# Patient Record
Sex: Female | Born: 1945
Health system: Southern US, Community
[De-identification: ages and names within clinical notes are randomized; demographics above are authoritative.]

## PROBLEM LIST (undated history)

## (undated) DIAGNOSIS — M12812 Other specific arthropathies, not elsewhere classified, left shoulder: Secondary | ICD-10-CM

## (undated) DIAGNOSIS — I639 Cerebral infarction, unspecified: Secondary | ICD-10-CM

## (undated) DIAGNOSIS — R9431 Abnormal electrocardiogram [ECG] [EKG]: Secondary | ICD-10-CM

## (undated) DIAGNOSIS — M75102 Unspecified rotator cuff tear or rupture of left shoulder, not specified as traumatic: Secondary | ICD-10-CM

## (undated) DIAGNOSIS — B029 Zoster without complications: Secondary | ICD-10-CM

## (undated) DIAGNOSIS — E669 Obesity, unspecified: Secondary | ICD-10-CM

## (undated) DIAGNOSIS — G709 Myoneural disorder, unspecified: Secondary | ICD-10-CM

## (undated) DIAGNOSIS — D649 Anemia, unspecified: Secondary | ICD-10-CM

## (undated) DIAGNOSIS — M12811 Other specific arthropathies, not elsewhere classified, right shoulder: Secondary | ICD-10-CM

## (undated) DIAGNOSIS — E538 Deficiency of other specified B group vitamins: Secondary | ICD-10-CM

## (undated) DIAGNOSIS — M797 Fibromyalgia: Secondary | ICD-10-CM

## (undated) DIAGNOSIS — Z87442 Personal history of urinary calculi: Secondary | ICD-10-CM

## (undated) DIAGNOSIS — F419 Anxiety disorder, unspecified: Secondary | ICD-10-CM

## (undated) DIAGNOSIS — M75101 Unspecified rotator cuff tear or rupture of right shoulder, not specified as traumatic: Secondary | ICD-10-CM

## (undated) DIAGNOSIS — E039 Hypothyroidism, unspecified: Secondary | ICD-10-CM

## (undated) DIAGNOSIS — E042 Nontoxic multinodular goiter: Secondary | ICD-10-CM

## (undated) DIAGNOSIS — K2270 Barrett's esophagus without dysplasia: Secondary | ICD-10-CM

## (undated) DIAGNOSIS — M1990 Unspecified osteoarthritis, unspecified site: Secondary | ICD-10-CM

## (undated) DIAGNOSIS — R52 Pain, unspecified: Secondary | ICD-10-CM

## (undated) DIAGNOSIS — M359 Systemic involvement of connective tissue, unspecified: Secondary | ICD-10-CM

## (undated) DIAGNOSIS — K219 Gastro-esophageal reflux disease without esophagitis: Secondary | ICD-10-CM

## (undated) DIAGNOSIS — R1310 Dysphagia, unspecified: Secondary | ICD-10-CM

## (undated) DIAGNOSIS — Z9889 Other specified postprocedural states: Secondary | ICD-10-CM

## (undated) DIAGNOSIS — R070 Pain in throat: Secondary | ICD-10-CM

## (undated) DIAGNOSIS — K222 Esophageal obstruction: Secondary | ICD-10-CM

## (undated) DIAGNOSIS — E785 Hyperlipidemia, unspecified: Secondary | ICD-10-CM

## (undated) DIAGNOSIS — F32A Depression, unspecified: Secondary | ICD-10-CM

## (undated) DIAGNOSIS — I10 Essential (primary) hypertension: Secondary | ICD-10-CM

## (undated) DIAGNOSIS — R0600 Dyspnea, unspecified: Secondary | ICD-10-CM

## (undated) DIAGNOSIS — N189 Chronic kidney disease, unspecified: Secondary | ICD-10-CM

## (undated) DIAGNOSIS — F329 Major depressive disorder, single episode, unspecified: Secondary | ICD-10-CM

## (undated) DIAGNOSIS — I499 Cardiac arrhythmia, unspecified: Secondary | ICD-10-CM

## (undated) DIAGNOSIS — K760 Fatty (change of) liver, not elsewhere classified: Secondary | ICD-10-CM

## (undated) DIAGNOSIS — E119 Type 2 diabetes mellitus without complications: Secondary | ICD-10-CM

## (undated) HISTORY — PX: ESOPHAGOGASTRODUODENOSCOPY: SHX1529

## (undated) HISTORY — PX: CHOLECYSTECTOMY: SHX55

## (undated) HISTORY — PX: BACK SURGERY: SHX140

## (undated) HISTORY — PX: ABDOMINAL HYSTERECTOMY: SHX81

## (undated) HISTORY — PX: COLON SURGERY: SHX602

## (undated) HISTORY — PX: OTHER SURGICAL HISTORY: SHX169

---
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
## 2004-07-07 ENCOUNTER — Ambulatory Visit: Payer: Self-pay | Admitting: Psychiatry

## 2005-11-30 ENCOUNTER — Ambulatory Visit: Payer: Self-pay | Admitting: Internal Medicine

## 2006-02-01 DIAGNOSIS — B029 Zoster without complications: Secondary | ICD-10-CM

## 2006-02-01 HISTORY — DX: Zoster without complications: B02.9

## 2006-12-12 ENCOUNTER — Ambulatory Visit: Payer: Self-pay | Admitting: Internal Medicine

## 2007-01-18 ENCOUNTER — Ambulatory Visit: Payer: Self-pay | Admitting: Internal Medicine

## 2008-02-06 ENCOUNTER — Ambulatory Visit: Payer: Self-pay | Admitting: Internal Medicine

## 2009-03-04 ENCOUNTER — Encounter: Payer: Self-pay | Admitting: Internal Medicine

## 2009-04-17 ENCOUNTER — Ambulatory Visit: Payer: Self-pay | Admitting: Internal Medicine

## 2009-06-25 ENCOUNTER — Ambulatory Visit: Payer: Self-pay | Admitting: Internal Medicine

## 2009-06-27 ENCOUNTER — Ambulatory Visit: Payer: Self-pay | Admitting: Internal Medicine

## 2010-05-28 ENCOUNTER — Ambulatory Visit: Payer: Self-pay | Admitting: Internal Medicine

## 2010-08-27 ENCOUNTER — Ambulatory Visit: Payer: Self-pay | Admitting: Emergency Medicine

## 2010-09-03 ENCOUNTER — Inpatient Hospital Stay: Payer: Self-pay | Admitting: Emergency Medicine

## 2010-09-04 LAB — PATHOLOGY REPORT

## 2011-07-02 ENCOUNTER — Ambulatory Visit: Payer: Self-pay | Admitting: Internal Medicine

## 2012-07-27 ENCOUNTER — Ambulatory Visit: Payer: Self-pay | Admitting: Internal Medicine

## 2013-03-01 ENCOUNTER — Ambulatory Visit: Payer: Self-pay | Admitting: Internal Medicine

## 2013-03-02 ENCOUNTER — Encounter: Payer: Self-pay | Admitting: Rheumatology

## 2013-03-04 ENCOUNTER — Encounter: Payer: Self-pay | Admitting: Rheumatology

## 2013-03-04 ENCOUNTER — Ambulatory Visit: Payer: Self-pay | Admitting: Internal Medicine

## 2013-04-19 DIAGNOSIS — M1990 Unspecified osteoarthritis, unspecified site: Secondary | ICD-10-CM | POA: Insufficient documentation

## 2013-05-18 DIAGNOSIS — N200 Calculus of kidney: Secondary | ICD-10-CM | POA: Insufficient documentation

## 2013-05-18 DIAGNOSIS — D510 Vitamin B12 deficiency anemia due to intrinsic factor deficiency: Secondary | ICD-10-CM | POA: Insufficient documentation

## 2013-05-30 ENCOUNTER — Ambulatory Visit: Payer: Self-pay | Admitting: Gastroenterology

## 2013-08-06 ENCOUNTER — Ambulatory Visit: Payer: Self-pay | Admitting: Internal Medicine

## 2013-08-07 DIAGNOSIS — E538 Deficiency of other specified B group vitamins: Secondary | ICD-10-CM | POA: Insufficient documentation

## 2013-10-18 ENCOUNTER — Ambulatory Visit: Payer: Self-pay | Admitting: Bariatrics

## 2013-10-19 ENCOUNTER — Ambulatory Visit: Payer: Self-pay | Admitting: Bariatrics

## 2014-01-01 ENCOUNTER — Ambulatory Visit: Payer: Self-pay | Admitting: Bariatrics

## 2014-02-20 ENCOUNTER — Ambulatory Visit: Payer: Self-pay | Admitting: Bariatrics

## 2014-02-20 DIAGNOSIS — I10 Essential (primary) hypertension: Secondary | ICD-10-CM

## 2014-02-20 LAB — BASIC METABOLIC PANEL
Anion Gap: 6 — ABNORMAL LOW (ref 7–16)
BUN: 15 mg/dL (ref 7–18)
Calcium, Total: 9.5 mg/dL (ref 8.5–10.1)
Chloride: 102 mmol/L (ref 98–107)
Co2: 28 mmol/L (ref 21–32)
Creatinine: 0.98 mg/dL (ref 0.60–1.30)
EGFR (African American): >60
EGFR (Non-African Amer.): 60 — ABNORMAL LOW
Glucose: 216 mg/dL — ABNORMAL HIGH (ref 65–99)
Osmolality: 279 (ref 275–301)
Potassium: 3.6 mmol/L (ref 3.5–5.1)
Sodium: 136 mmol/L (ref 136–145)

## 2014-02-20 LAB — CBC WITH DIFFERENTIAL/PLATELET
Basophil #: 0.1 10*3/uL (ref 0.0–0.1)
Basophil %: 0.9 %
Eosinophil #: 0.2 10*3/uL (ref 0.0–0.7)
Eosinophil %: 2.3 %
HCT: 39.5 % (ref 35.0–47.0)
HGB: 12.9 g/dL (ref 12.0–16.0)
Lymphocyte %: 28.1 %
Lymphs Abs: 2.1 10*3/uL (ref 1.0–3.6)
MCH: 28.9 pg (ref 26.0–34.0)
MCHC: 32.6 g/dL (ref 32.0–36.0)
MCV: 89 fL (ref 80–100)
Monocyte #: 0.6 x10 3/mm (ref 0.2–0.9)
Monocyte %: 8.6 %
Neutrophil #: 4.4 10*3/uL (ref 1.4–6.5)
Neutrophil %: 60.1 %
RBC: 4.45 10*6/uL (ref 3.80–5.20)
RDW: 15 % — ABNORMAL HIGH (ref 11.5–14.5)
WBC: 7.3 10*3/uL (ref 3.6–11.0)

## 2014-03-05 ENCOUNTER — Inpatient Hospital Stay: Payer: Self-pay | Admitting: Bariatrics

## 2014-03-06 LAB — BASIC METABOLIC PANEL
Anion Gap: 8 (ref 7–16)
BUN: 12 mg/dL (ref 7–18)
Calcium, Total: 8.7 mg/dL (ref 8.5–10.1)
Chloride: 106 mmol/L (ref 98–107)
Co2: 27 mmol/L (ref 21–32)
Creatinine: 1 mg/dL (ref 0.60–1.30)
EGFR (African American): >60
EGFR (Non-African Amer.): 59 — ABNORMAL LOW
Glucose: 185 mg/dL — ABNORMAL HIGH (ref 65–99)
Osmolality: 286 (ref 275–301)
Potassium: 3.8 mmol/L (ref 3.5–5.1)
Sodium: 141 mmol/L (ref 136–145)

## 2014-03-06 LAB — MAGNESIUM: Magnesium: 1.9 mg/dL

## 2014-03-06 LAB — CBC WITH DIFFERENTIAL/PLATELET
Basophil #: 0.1 10*3/uL (ref 0.0–0.1)
Basophil %: 0.4 %
Eosinophil #: 0 10*3/uL (ref 0.0–0.7)
Eosinophil %: 0.1 %
HCT: 31.8 % — ABNORMAL LOW (ref 35.0–47.0)
HGB: 10.4 g/dL — ABNORMAL LOW (ref 12.0–16.0)
Lymphocyte %: 9.2 %
Lymphs Abs: 1.2 10*3/uL (ref 1.0–3.6)
MCH: 28.8 pg (ref 26.0–34.0)
MCHC: 32.7 g/dL (ref 32.0–36.0)
MCV: 88 fL (ref 80–100)
Monocyte #: 0.9 x10 3/mm (ref 0.2–0.9)
Monocyte %: 6.8 %
Neutrophil #: 11.1 10*3/uL — ABNORMAL HIGH (ref 1.4–6.5)
Neutrophil %: 83.5 %
Platelet: 172 10*3/uL (ref 150–440)
RBC: 3.61 10*6/uL — ABNORMAL LOW (ref 3.80–5.20)
RDW: 14.4 % (ref 11.5–14.5)
WBC: 13.3 10*3/uL — ABNORMAL HIGH (ref 3.6–11.0)

## 2014-05-27 LAB — SURGICAL PATHOLOGY

## 2014-06-02 NOTE — Op Note (Signed)
PATIENT NAME:  Katelyn Simmons, Katelyn Simmons MR#:  [id number] DATE OF BIRTH:  1945-08-30  DATE OF PROCEDURE:  03/05/2014  PROCEDURES PERFORMED:  Laparoscopic lysis of adhesions followed by duodenal diverticulectomy.   PREOPERATIVE DIAGNOSIS: Duodenal diverticulum involving the third portion of the duodenum.  POSTOPERATIVE DIAGNOSIS: Duodenal diverticulum involving the third portion of the duodenum with pseudodiverticulum with absence of serosa and any significant muscular coverage of identified diverticulum, also extensive omental adhesions following prior open cholecystectomy.   PHYSICIANS IN ATTENDANCE: Arletta Bale, MD   ASSISTANT: Link Snuffer, PA   ESTIMATED BLOOD LOSS: Approximately 50 mL.   DESCRIPTION OF PROCEDURE: The patient was brought to the operating room and placed in the supine position. General anesthesia was obtained with orotracheal intubation. Foley catheter inserted sterilely. TED hose and Thromboguards were applied and a foot board applied at the end of the operative bed. The abdomen and lower chest sterilely prepped and draped. A 5 mm Optiview trocar introduced under direct visualization in the left upper quadrant of the abdomen. Pneumoperitoneum was obtained with carbon dioxide. The patient was noted to have diffuse omental adhesions in the central and right upper quadrant of the abdomen. A 5 mm Optiview trocar introduced under direct visualization and the left lower quadrant of the abdomen and dissection of adhesions was initiated by a combination of blunt dissection and use of the Harmonic scalpel. This was extended across the upper abdomen and eventually into the right upper quadrant of the abdomen. As this was done, additional 5 mm trocars were introduced in the left upper and right upper quadrant of the abdomen. Eventually 4 trocars were placed across the upper abdomen. Further omental adhesions freed from the undersurface of the right lobe of the liver where they were noted to be  particularly dense. This ultimately led to identification of the distal stomach and proximal duodenum. On initial evaluation, there were no discernible mass effects appreciated. The patient had omental adhesions along the greater curvature of the stomach. These were taken down by use of the Harmonic scalpel. After freeing adhesions associated with the stomach, the patient had release of peritoneal attachments of the first and then second portions of the duodenum, mobilizing the proximal transverse colon away from the duodenum. The second portion of the duodenum failed to reveal any evidence of overt mass effect. This dissection was then extended towards the midline to a point which the superior mesenteric vasculature was encountered. Again, there was no overt evidence of mass effect. It was decided at this point to expose the ligament of Treitz which was readily identified. Portions of the peritoneum associated with the mesentery of the colon of this area was scored and the fourth portion of the duodenum was visualized through the colon mesentery. No abnormalities were associated with this. At this point, the gastrocolic ligament was incised inferior to the gastroepiploic vasculature entering the lesser sac. Gastric attachments to the pancreas mobilized by use of the Harmonic scalpel. The transverse colon was then freed from the inferior margins of the pancreas. The fourth portion of the duodenum inspected and this was then followed again towards the superior mesenteric vasculature. Again, no mass effect identified. The proximal aspect of the duodenum then reinspected. The undersurface of the duodenum was freed from the retroperitoneal attachments and immediately adjacent to the superior mesenteric vasculature, a diverticulum became identifiable as soft tissue attachments were freed. This led to identification of a mucosal and submucosal diverticulum extending through what appeared to be an eventration of the  duodenal wall.  This was lying almost in a retropancreatic location. This was freed circumferentially down to an area where the fine margin was noted along the inferior margin of the diverticulum and eventually what was felt to be the superior margin of the diverticulum. We decided to transect this with a GIA stapler. A 12 mm trocar introduced in the left upper quadrant of the abdomen and a white load GIA stapler was used to divide the diverticulum as close as possible to the intact serosal aspects of the duodenum. This was then treated by approximation of the serosa with interrupted 2-0 Vicryl suture. The suture line then sprayed with Evicel glue followed by positioning of omentum in the retroduodenal position. This was secured to the margins of the duodenum with interrupted Vicryl suture. It was at this point that on inspection of the lateral margin of the superior mesenteric vasculature and fat pad, a small lymphatic leak was identified. The lymphatic channel appeared to be evident. This was sealed by Hemoclips. Next, JP drains were introduced through right lateral and left lateral trocar sites. These were positioned along the inferior margin of the duodenum and on the superior margin of the colonic mesentery. The omentum was reinspected for hemostasis and found to be adequate. At this time, no additional intervention was felt to be necessary. The trocars were removed. The wound was injected with 0.25% Marcaine and closed with staples. The drain secured with nylon sutures, covered with sterile dressings. The patient allowed to recover, having tolerated the procedure well.   ____________________________ Venia Carbon. Duke Salvia, MD mat:TT D: 03/06/2014 20:22:16 ET T: 03/06/2014 21:46:25 ET JOB#: [id number]  cc: Legrand Como A. Duke Salvia, MD, <Dictator> Ladora Daniel MD ELECTRONICALLY SIGNED 04/04/2014 17:57

## 2014-10-08 ENCOUNTER — Other Ambulatory Visit: Payer: Self-pay | Admitting: Internal Medicine

## 2014-10-08 ENCOUNTER — Ambulatory Visit
Admission: RE | Admit: 2014-10-08 | Discharge: 2014-10-08 | Disposition: A | Discharge status: Home / Self Care | Payer: Medicare HMO | Source: Ambulatory Visit | Attending: Internal Medicine | Admitting: Internal Medicine

## 2014-10-08 DIAGNOSIS — Z1231 Encounter for screening mammogram for malignant neoplasm of breast: Secondary | ICD-10-CM

## 2014-10-22 ENCOUNTER — Other Ambulatory Visit: Payer: Self-pay | Admitting: Internal Medicine

## 2014-10-22 DIAGNOSIS — M25511 Pain in right shoulder: Secondary | ICD-10-CM

## 2014-10-30 ENCOUNTER — Ambulatory Visit: Payer: Medicare HMO

## 2014-11-01 ENCOUNTER — Ambulatory Visit: Payer: Medicare HMO

## 2014-12-16 DIAGNOSIS — M75101 Unspecified rotator cuff tear or rupture of right shoulder, not specified as traumatic: Secondary | ICD-10-CM | POA: Insufficient documentation

## 2014-12-16 DIAGNOSIS — M12811 Other specific arthropathies, not elsewhere classified, right shoulder: Secondary | ICD-10-CM | POA: Insufficient documentation

## 2014-12-16 DIAGNOSIS — M12812 Other specific arthropathies, not elsewhere classified, left shoulder: Secondary | ICD-10-CM | POA: Insufficient documentation

## 2014-12-16 DIAGNOSIS — M75120 Complete rotator cuff tear or rupture of unspecified shoulder, not specified as traumatic: Secondary | ICD-10-CM | POA: Insufficient documentation

## 2015-02-20 DIAGNOSIS — I10 Essential (primary) hypertension: Secondary | ICD-10-CM | POA: Insufficient documentation

## 2015-02-20 DIAGNOSIS — K2970 Gastritis, unspecified, without bleeding: Secondary | ICD-10-CM | POA: Insufficient documentation

## 2015-05-22 DIAGNOSIS — N183 Chronic kidney disease, stage 3 unspecified: Secondary | ICD-10-CM | POA: Insufficient documentation

## 2015-05-22 DIAGNOSIS — R9431 Abnormal electrocardiogram [ECG] [EKG]: Secondary | ICD-10-CM | POA: Insufficient documentation

## 2015-05-22 DIAGNOSIS — J301 Allergic rhinitis due to pollen: Secondary | ICD-10-CM | POA: Insufficient documentation

## 2015-05-22 DIAGNOSIS — R590 Localized enlarged lymph nodes: Secondary | ICD-10-CM | POA: Insufficient documentation

## 2015-09-11 ENCOUNTER — Other Ambulatory Visit: Payer: Self-pay | Admitting: Internal Medicine

## 2015-09-11 DIAGNOSIS — Z1231 Encounter for screening mammogram for malignant neoplasm of breast: Secondary | ICD-10-CM

## 2015-10-09 ENCOUNTER — Ambulatory Visit: Payer: Medicare HMO

## 2015-10-16 ENCOUNTER — Other Ambulatory Visit: Payer: Self-pay | Admitting: Internal Medicine

## 2015-10-16 ENCOUNTER — Ambulatory Visit
Admission: RE | Admit: 2015-10-16 | Discharge: 2015-10-16 | Disposition: A | Discharge status: Home / Self Care | Payer: PPO | Source: Ambulatory Visit | Attending: Internal Medicine | Admitting: Internal Medicine

## 2015-10-16 DIAGNOSIS — Z1231 Encounter for screening mammogram for malignant neoplasm of breast: Secondary | ICD-10-CM | POA: Insufficient documentation

## 2015-11-12 ENCOUNTER — Other Ambulatory Visit: Payer: Self-pay | Admitting: Internal Medicine

## 2015-11-12 DIAGNOSIS — R1010 Upper abdominal pain, unspecified: Secondary | ICD-10-CM

## 2015-11-25 ENCOUNTER — Ambulatory Visit
Admission: RE | Admit: 2015-11-25 | Discharge: 2015-11-25 | Disposition: A | Discharge status: Home / Self Care | Payer: PPO | Source: Ambulatory Visit | Attending: Internal Medicine | Admitting: Internal Medicine

## 2015-11-25 DIAGNOSIS — I700 Atherosclerosis of aorta: Secondary | ICD-10-CM | POA: Diagnosis not present

## 2015-11-25 DIAGNOSIS — K5730 Diverticulosis of large intestine without perforation or abscess without bleeding: Secondary | ICD-10-CM | POA: Diagnosis not present

## 2015-11-25 DIAGNOSIS — K760 Fatty (change of) liver, not elsewhere classified: Secondary | ICD-10-CM | POA: Insufficient documentation

## 2015-11-25 DIAGNOSIS — I2510 Atherosclerotic heart disease of native coronary artery without angina pectoris: Secondary | ICD-10-CM | POA: Insufficient documentation

## 2015-11-25 DIAGNOSIS — R1010 Upper abdominal pain, unspecified: Secondary | ICD-10-CM | POA: Insufficient documentation

## 2015-11-25 HISTORY — DX: Systemic involvement of connective tissue, unspecified: M35.9

## 2015-11-25 MED ORDER — IOPAMIDOL (ISOVUE-300) INJECTION 61%
100.0000 mL | Freq: Once | INTRAVENOUS | Status: AC | PRN
  Administered 2015-11-25: 100 mL via INTRAVENOUS

## 2015-12-10 DIAGNOSIS — K760 Fatty (change of) liver, not elsewhere classified: Secondary | ICD-10-CM | POA: Insufficient documentation

## 2015-12-16 ENCOUNTER — Other Ambulatory Visit: Payer: Self-pay | Admitting: Nurse Practitioner

## 2015-12-16 DIAGNOSIS — R09A2 Foreign body sensation, throat: Secondary | ICD-10-CM

## 2015-12-16 DIAGNOSIS — R110 Nausea: Secondary | ICD-10-CM

## 2015-12-16 DIAGNOSIS — R0989 Other specified symptoms and signs involving the circulatory and respiratory systems: Secondary | ICD-10-CM

## 2016-01-07 ENCOUNTER — Ambulatory Visit
Admission: RE | Admit: 2016-01-07 | Discharge: 2016-01-07 | Disposition: A | Discharge status: Home / Self Care | Payer: PPO | Source: Ambulatory Visit | Attending: Nurse Practitioner | Admitting: Nurse Practitioner

## 2016-01-07 DIAGNOSIS — R1312 Dysphagia, oropharyngeal phase: Secondary | ICD-10-CM

## 2016-01-07 DIAGNOSIS — R0989 Other specified symptoms and signs involving the circulatory and respiratory systems: Secondary | ICD-10-CM | POA: Diagnosis not present

## 2016-01-07 DIAGNOSIS — R110 Nausea: Secondary | ICD-10-CM | POA: Diagnosis not present

## 2016-01-07 DIAGNOSIS — R09A2 Foreign body sensation, throat: Secondary | ICD-10-CM

## 2016-01-07 NOTE — Therapy (Signed)
[place], [place], [address] Phone: [phone]   Fax:     Modified Barium Swallow  Patient Details  Name: Katelyn Simmons MRN: [id number] Date of Birth: 05-27-1945 No Data Recorded  Encounter Date: 01/07/2016      End of Session - 01/07/16 1336    Visit Number 1   Number of Visits 1   Date for SLP Re-Evaluation 01/07/16      Past Medical History:  Diagnosis Date  . Collagen vascular disease ([place])    RA    No past surgical history on file.  There were no vitals filed for this visit.   Subjective: Patient behavior: (alertness, ability to follow instructions, etc.): Patient is alert, able to state her symptoms, and follow directions.  Chief complaint: sensation of something in her throat and it elicits a gag at times.   Objective:  Radiological Procedure: A videoflouroscopic evaluation of oral-preparatory, reflex initiation, and pharyngeal phases of the swallow was performed; as well as a screening of the upper esophageal phase.  I. POSTURE: Upright in MBS chair  II. VIEW: Lateral  III. COMPENSATORY STRATEGIES: N/A IV. BOLUSES ADMINISTERED:   Thin Liquid: 1 small sips, 5 rapid consecutive sips   Nectar-thick Liquid: 1 moderate sip    Puree: 3 teaspoon presentation   Mechanical Soft: 1/4 graham cracker in applesauce   Barium tablet  V. RESULTS OF EVALUATION: A. ORAL PREPARATORY PHASE: (The lips, tongue, and velum are observed for strength and coordination)       **Overall Severity Rating: Within normal limits  B. SWALLOW INITIATION/REFLEX: (The reflex is normal if "triggered" by the time the bolus reached the base of the tongue)  **Overall Severity Rating: Within normal limits  C. PHARYNGEAL PHASE: (Pharyngeal function is normal if the bolus shows rapid, smooth, and continuous transit through the pharynx and there is no pharyngeal residue after the swallow)  **Overall  Severity Rating: Minimal; decrease laryngeal vestibule closure at the height of the swallow  D. LARYNGEAL PENETRATION: (Material entering into the laryngeal inlet/vestibule but not aspirated) With thin liquid: deep penetration with cough X1, transient penetration X2, no penetration X2   E. ASPIRATION: None  F. ESOPHAGEAL PHASE: (Screening of the upper esophagus) In the cervical esophagus there is impingement of an osteophyte.  However all boluses flow through the cervical esophagus, including a barium tablet.  An esophageal sweep showed stasis of the barium tablet in the mid-esophagus.   ASSESSMENT: This 70 year old woman, with sensation of foreign body in her throat, is presenting with minimal oropharyngeal dyspahgia.  Oral control of the bolus including oral hold, rotary mastication, and anterior to posterior transfer are within normal limits. Timing of the pharyngeal swallow is within normal limits.  Aspects of the pharyngeal stage of swallowing including tongue base retraction, hyolaryngeal excursion, epiglottic inversion, and duration/amplitude of UES opening are within normal limits.  There is no pharyngeal residue.  With rapid, consecutive drinking of thin liquid, there is laryngeal penetration with immediate, protective cough.  There was no observed tracheal aspiration.  In the cervical esophagus there is impingement of an osteophyte.  However all boluses flow through the cervical esophagus, including a barium tablet.  An esophageal sweep showed stasis of the barium tablet in the mid-esophagus.  The patient was counseled that her swallowing is safe.  She is counseled to avoid gulping liquids.  The patient would benefit from further esophageal evaluation with a barium study.  PLAN/RECOMMENDATIONS:  A. Diet: Regular   B. Swallowing Precautions: No gulping   C. Recommended consultation to: GI, barium swallow study   D. Therapy recommendations: N/A   E. Results and recommendations were  discussed with the patient immediately following the study and the final report routed to the referring practitioner.   Dysphagia, oropharyngeal phase  Nausea - Plan: DG OP Swallowing Func-Medicare/Speech Path, DG OP Swallowing Func-Medicare/Speech Path  Sensation of foreign body in throat - Plan: DG OP Swallowing Func-Medicare/Speech Path, DG OP Swallowing Func-Medicare/Speech Path      G-Codes - 02/01/2016 1336    Functional Assessment Tool Used MBSS, clinical judgment   Functional Limitations Swallowing   Swallow Current Status [id number]) At least 1 percent but less than 20 percent impaired, limited or restricted   Swallow Goal Status [id number]) At least 1 percent but less than 20 percent impaired, limited or restricted   Swallow Discharge Status [phone]) At least 1 percent but less than 20 percent impaired, limited or restricted          Problem List There are no active problems to display for this patient.  Leroy Sea, MS/CCC- SLP  Lou Miner 02-01-16, 1:37 PM  [facility] [place], [place], [address] Phone: [phone]   Fax:     Name: ASHLA PULLIS MRN: [id number] Date of Birth: 1945/07/25

## 2016-03-18 DIAGNOSIS — R933 Abnormal findings on diagnostic imaging of other parts of digestive tract: Secondary | ICD-10-CM | POA: Insufficient documentation

## 2016-03-18 DIAGNOSIS — K219 Gastro-esophageal reflux disease without esophagitis: Secondary | ICD-10-CM | POA: Diagnosis not present

## 2016-03-18 DIAGNOSIS — R1310 Dysphagia, unspecified: Secondary | ICD-10-CM | POA: Diagnosis not present

## 2016-03-18 DIAGNOSIS — R109 Unspecified abdominal pain: Secondary | ICD-10-CM | POA: Diagnosis not present

## 2016-03-18 DIAGNOSIS — K449 Diaphragmatic hernia without obstruction or gangrene: Secondary | ICD-10-CM | POA: Diagnosis not present

## 2016-03-26 DIAGNOSIS — M75122 Complete rotator cuff tear or rupture of left shoulder, not specified as traumatic: Secondary | ICD-10-CM | POA: Diagnosis not present

## 2016-03-26 DIAGNOSIS — M75121 Complete rotator cuff tear or rupture of right shoulder, not specified as traumatic: Secondary | ICD-10-CM | POA: Diagnosis not present

## 2016-03-26 DIAGNOSIS — M12812 Other specific arthropathies, not elsewhere classified, left shoulder: Secondary | ICD-10-CM | POA: Diagnosis not present

## 2016-03-26 DIAGNOSIS — M12811 Other specific arthropathies, not elsewhere classified, right shoulder: Secondary | ICD-10-CM | POA: Diagnosis not present

## 2016-03-30 ENCOUNTER — Encounter: Payer: Self-pay | Admitting: *Deleted

## 2016-03-31 ENCOUNTER — Ambulatory Visit: Payer: Medicare HMO | Admitting: Anesthesiology

## 2016-03-31 ENCOUNTER — Encounter
Admission: RE | Disposition: A | Discharge status: Home / Self Care | Payer: Self-pay | Source: Ambulatory Visit | Attending: Unknown Physician Specialty

## 2016-03-31 ENCOUNTER — Ambulatory Visit
Admission: RE | Admit: 2016-03-31 | Discharge: 2016-03-31 | Disposition: A | Discharge status: Home / Self Care | Payer: Medicare HMO | Source: Ambulatory Visit | Attending: Unknown Physician Specialty | Admitting: Unknown Physician Specialty

## 2016-03-31 DIAGNOSIS — M069 Rheumatoid arthritis, unspecified: Secondary | ICD-10-CM | POA: Diagnosis not present

## 2016-03-31 DIAGNOSIS — K210 Gastro-esophageal reflux disease with esophagitis: Secondary | ICD-10-CM | POA: Insufficient documentation

## 2016-03-31 DIAGNOSIS — Z885 Allergy status to narcotic agent status: Secondary | ICD-10-CM | POA: Insufficient documentation

## 2016-03-31 DIAGNOSIS — E538 Deficiency of other specified B group vitamins: Secondary | ICD-10-CM | POA: Insufficient documentation

## 2016-03-31 DIAGNOSIS — Z882 Allergy status to sulfonamides status: Secondary | ICD-10-CM | POA: Diagnosis not present

## 2016-03-31 DIAGNOSIS — M359 Systemic involvement of connective tissue, unspecified: Secondary | ICD-10-CM | POA: Diagnosis not present

## 2016-03-31 DIAGNOSIS — R1310 Dysphagia, unspecified: Secondary | ICD-10-CM | POA: Diagnosis not present

## 2016-03-31 DIAGNOSIS — Z87442 Personal history of urinary calculi: Secondary | ICD-10-CM | POA: Diagnosis not present

## 2016-03-31 DIAGNOSIS — Z91012 Allergy to eggs: Secondary | ICD-10-CM | POA: Insufficient documentation

## 2016-03-31 DIAGNOSIS — Z9049 Acquired absence of other specified parts of digestive tract: Secondary | ICD-10-CM | POA: Diagnosis not present

## 2016-03-31 DIAGNOSIS — M1990 Unspecified osteoarthritis, unspecified site: Secondary | ICD-10-CM | POA: Insufficient documentation

## 2016-03-31 DIAGNOSIS — K2970 Gastritis, unspecified, without bleeding: Secondary | ICD-10-CM | POA: Insufficient documentation

## 2016-03-31 DIAGNOSIS — K319 Disease of stomach and duodenum, unspecified: Secondary | ICD-10-CM | POA: Diagnosis not present

## 2016-03-31 DIAGNOSIS — E669 Obesity, unspecified: Secondary | ICD-10-CM | POA: Diagnosis not present

## 2016-03-31 DIAGNOSIS — Z7982 Long term (current) use of aspirin: Secondary | ICD-10-CM | POA: Diagnosis not present

## 2016-03-31 DIAGNOSIS — K2270 Barrett's esophagus without dysplasia: Secondary | ICD-10-CM | POA: Insufficient documentation

## 2016-03-31 DIAGNOSIS — K2950 Unspecified chronic gastritis without bleeding: Secondary | ICD-10-CM | POA: Diagnosis not present

## 2016-03-31 DIAGNOSIS — K449 Diaphragmatic hernia without obstruction or gangrene: Secondary | ICD-10-CM | POA: Diagnosis not present

## 2016-03-31 DIAGNOSIS — D649 Anemia, unspecified: Secondary | ICD-10-CM | POA: Diagnosis not present

## 2016-03-31 DIAGNOSIS — Z6832 Body mass index (BMI) 32.0-32.9, adult: Secondary | ICD-10-CM | POA: Insufficient documentation

## 2016-03-31 DIAGNOSIS — E039 Hypothyroidism, unspecified: Secondary | ICD-10-CM | POA: Diagnosis not present

## 2016-03-31 DIAGNOSIS — K2960 Other gastritis without bleeding: Secondary | ICD-10-CM | POA: Diagnosis not present

## 2016-03-31 DIAGNOSIS — Z90710 Acquired absence of both cervix and uterus: Secondary | ICD-10-CM | POA: Insufficient documentation

## 2016-03-31 DIAGNOSIS — I129 Hypertensive chronic kidney disease with stage 1 through stage 4 chronic kidney disease, or unspecified chronic kidney disease: Secondary | ICD-10-CM | POA: Diagnosis not present

## 2016-03-31 DIAGNOSIS — K209 Esophagitis, unspecified: Secondary | ICD-10-CM | POA: Diagnosis not present

## 2016-03-31 DIAGNOSIS — Z79899 Other long term (current) drug therapy: Secondary | ICD-10-CM | POA: Diagnosis not present

## 2016-03-31 DIAGNOSIS — N189 Chronic kidney disease, unspecified: Secondary | ICD-10-CM | POA: Diagnosis not present

## 2016-03-31 DIAGNOSIS — E1122 Type 2 diabetes mellitus with diabetic chronic kidney disease: Secondary | ICD-10-CM | POA: Insufficient documentation

## 2016-03-31 HISTORY — DX: Abnormal electrocardiogram (ECG) (EKG): R94.31

## 2016-03-31 HISTORY — DX: Pain in throat: R07.0

## 2016-03-31 HISTORY — DX: Unspecified rotator cuff tear or rupture of right shoulder, not specified as traumatic: M75.101

## 2016-03-31 HISTORY — PX: ESOPHAGOGASTRODUODENOSCOPY (EGD) WITH PROPOFOL: SHX5813

## 2016-03-31 HISTORY — DX: Chronic kidney disease, unspecified: N18.9

## 2016-03-31 HISTORY — DX: Hypothyroidism, unspecified: E03.9

## 2016-03-31 HISTORY — DX: Deficiency of other specified B group vitamins: E53.8

## 2016-03-31 HISTORY — DX: Anemia, unspecified: D64.9

## 2016-03-31 HISTORY — DX: Pain, unspecified: R52

## 2016-03-31 HISTORY — DX: Gastro-esophageal reflux disease without esophagitis: K21.9

## 2016-03-31 HISTORY — DX: Unspecified rotator cuff tear or rupture of left shoulder, not specified as traumatic: M75.102

## 2016-03-31 HISTORY — DX: Other specified postprocedural states: Z98.890

## 2016-03-31 HISTORY — DX: Other specific arthropathies, not elsewhere classified, right shoulder: M12.811

## 2016-03-31 HISTORY — DX: Type 2 diabetes mellitus without complications: E11.9

## 2016-03-31 HISTORY — DX: Unspecified rotator cuff tear or rupture of right shoulder, not specified as traumatic: M12.812

## 2016-03-31 HISTORY — DX: Unspecified osteoarthritis, unspecified site: M19.90

## 2016-03-31 HISTORY — DX: Essential (primary) hypertension: I10

## 2016-03-31 HISTORY — DX: Obesity, unspecified: E66.9

## 2016-03-31 HISTORY — DX: Personal history of urinary calculi: Z87.442

## 2016-03-31 HISTORY — DX: Dysphagia, unspecified: R13.10

## 2016-03-31 LAB — GLUCOSE, CAPILLARY: Glucose-Capillary: 205 mg/dL — ABNORMAL HIGH (ref 65–99)

## 2016-03-31 SURGERY — ESOPHAGOGASTRODUODENOSCOPY (EGD) WITH PROPOFOL
Anesthesia: General

## 2016-03-31 MED ORDER — MIDAZOLAM HCL 2 MG/2ML IJ SOLN
INTRAMUSCULAR | Status: AC
  Filled 2016-03-31: qty 2

## 2016-03-31 MED ORDER — FENTANYL CITRATE (PF) 100 MCG/2ML IJ SOLN
INTRAMUSCULAR | Status: AC
  Filled 2016-03-31: qty 2

## 2016-03-31 MED ORDER — SODIUM CHLORIDE 0.9 % IV SOLN: INTRAVENOUS | Status: DC

## 2016-03-31 MED ORDER — LIDOCAINE HCL (PF) 1 % IJ SOLN
2.0000 mL | Freq: Once | INTRAMUSCULAR | Status: AC
  Administered 2016-03-31: 0.3 mL via INTRADERMAL

## 2016-03-31 MED ORDER — LIDOCAINE HCL (PF) 2 % IJ SOLN
INTRAMUSCULAR | Status: AC
  Filled 2016-03-31: qty 2

## 2016-03-31 MED ORDER — SODIUM CHLORIDE 0.9 % IV SOLN
INTRAVENOUS | Status: DC
  Administered 2016-03-31: 1000 mL via INTRAVENOUS

## 2016-03-31 MED ORDER — FENTANYL CITRATE (PF) 100 MCG/2ML IJ SOLN
INTRAMUSCULAR | Status: DC | PRN
  Administered 2016-03-31: 50 ug via INTRAVENOUS

## 2016-03-31 MED ORDER — PROPOFOL 10 MG/ML IV BOLUS
INTRAVENOUS | Status: DC | PRN
  Administered 2016-03-31 (×2): 30 mg via INTRAVENOUS
  Administered 2016-03-31: 100 mg via INTRAVENOUS

## 2016-03-31 MED ORDER — LIDOCAINE HCL (PF) 1 % IJ SOLN
INTRAMUSCULAR | Status: AC
  Administered 2016-03-31: 0.3 mL via INTRADERMAL
  Filled 2016-03-31: qty 2

## 2016-03-31 MED ORDER — MIDAZOLAM HCL 5 MG/5ML IJ SOLN
INTRAMUSCULAR | Status: DC | PRN
  Administered 2016-03-31: 1 mg via INTRAVENOUS

## 2016-03-31 MED ORDER — PROPOFOL 10 MG/ML IV BOLUS
INTRAVENOUS | Status: AC
  Filled 2016-03-31: qty 20

## 2016-03-31 NOTE — Anesthesia Postprocedure Evaluation (Signed)
Anesthesia Post Note  Patient: Philana L Knotts  Procedure(s) Performed: Procedure(s) (LRB): ESOPHAGOGASTRODUODENOSCOPY (EGD) WITH PROPOFOL (N/A)  Patient location during evaluation: Endoscopy Anesthesia Type: General Level of consciousness: awake and alert Pain management: pain level controlled Vital Signs Assessment: post-procedure vital signs reviewed and stable Respiratory status: spontaneous breathing and respiratory function stable Cardiovascular status: stable Anesthetic complications: no     Last Vitals:  Vitals:   03/31/16 0820 03/31/16 0914  BP: (!) 154/64 132/62  Pulse: 72 77  Resp: 18   Temp: 37.2 C 36.7 C    Last Pain:  Vitals:   03/31/16 0914  TempSrc: Tympanic  PainSc:                  Krislynn Gronau K

## 2016-03-31 NOTE — Anesthesia Post-op Follow-up Note (Signed)
Anesthesia QCDR form completed.        

## 2016-03-31 NOTE — Transfer of Care (Signed)
Immediate Anesthesia Transfer of Care Note  Patient: Katelyn Simmons  Procedure(s) Performed: Procedure(s): ESOPHAGOGASTRODUODENOSCOPY (EGD) WITH PROPOFOL (N/A)  Patient Location: PACU and Endoscopy Unit  Anesthesia Type:General  Level of Consciousness: sedated  Airway & Oxygen Therapy: Patient Spontanous Breathing and Patient connected to nasal cannula oxygen  Post-op Assessment: Report given to RN and Post -op Vital signs reviewed and stable  Post vital signs: stable  Last Vitals:  Vitals:   03/31/16 0820 03/31/16 0914  BP: (!) 154/64 132/62  Pulse: 72 77  Resp: 18   Temp: 37.2 C 36.7 C    Last Pain:  Vitals:   03/31/16 0914  TempSrc: Tympanic  PainSc:          Complications: No apparent anesthesia complications

## 2016-03-31 NOTE — Op Note (Signed)
[facility] Patient Name: Katelyn Simmons Procedure Date: 03/31/2016 8:52 AM MRN: [id number] Account #: [id number] Date of Birth: [birth year] Admit Type: Outpatient Age: 71 Room: [facility] ENDO ROOM 4 Gender: Female Note Status: Finalized Procedure:            Upper GI endoscopy Indications:          Dysphagia Providers:            Manya Silvas, MD Referring MD:         Glendon Axe (Referring MD) Medicines:            Propofol per Anesthesia Complications:        No immediate complications. Procedure:            Pre-Anesthesia Assessment:                       - After reviewing the risks and benefits, the patient                        was deemed in satisfactory condition to undergo the                        procedure.                       After obtaining informed consent, the endoscope was                        passed under direct vision. Throughout the procedure,                        the patient's blood pressure, pulse, and oxygen                        saturations were monitored continuously. The Endoscope                        was introduced through the mouth, and advanced to the                        second part of duodenum. The upper GI endoscopy was                        accomplished without difficulty. The patient tolerated                        the procedure well. Findings:      LA Grade B (one or more mucosal breaks greater than 5 mm, not extending       between the tops of two mucosal folds) esophagitis with no bleeding was       found 35 cm from the incisors. Biopsies were taken with a cold forceps       for histology. Rule out Barretts esophagus A guidewire was placed and       the scope was withdrawn. After exam of all areas a Dilation was       performed with a Savary dilator with no resistance at 16 mm.      Striped mildly erythematous mucosa without bleeding was found in the       gastric antrum. Biopsies were taken with  a cold forceps for histology.  Also biopsied the body for possible H. pylori.      The examined duodenum was normal. Impression:           - LA Grade B reflux esophagitis. Rule out Barrett's                        esophagus. Biopsied.                       - Erythematous mucosa in the antrum. Biopsied.                       - Normal examined duodenum. Recommendation:       - soft food for 3 days, eat slowly, chew well, take                        small bites Manya Silvas, MD 03/31/2016 9:16:04 AM This report has been signed electronically. Number of Addenda: 0 Note Initiated On: 03/31/2016 8:52 AM      [facility]

## 2016-03-31 NOTE — Anesthesia Preprocedure Evaluation (Signed)
Anesthesia Evaluation  Patient identified by MRN, date of birth, ID band Patient awake    Reviewed: Allergy & Precautions, NPO status , Patient's Chart, lab work & pertinent test results  History of Anesthesia Complications Negative for: history of anesthetic complications  Airway Mallampati: I       Dental   Pulmonary neg pulmonary ROS,           Cardiovascular hypertension, Pt. on medications and Pt. on home beta blockers + dysrhythmias (Occ. ?PVCs)      Neuro/Psych Seizures - ("little seizures" none in years, no meds),     GI/Hepatic GERD  Medicated and Controlled,  Endo/Other  diabetes, Type 2, Oral Hypoglycemic AgentsHypothyroidism   Renal/GU Renal InsufficiencyRenal disease     Musculoskeletal   Abdominal   Peds  Hematology  (+) anemia ,   Anesthesia Other Findings   Reproductive/Obstetrics                             Anesthesia Physical Anesthesia Plan  ASA: III  Anesthesia Plan: General   Post-op Pain Management:    Induction: Intravenous  Airway Management Planned: Nasal Cannula  Additional Equipment:   Intra-op Plan:   Post-operative Plan:   Informed Consent: I have reviewed the patients History and Physical, chart, labs and discussed the procedure including the risks, benefits and alternatives for the proposed anesthesia with the patient or authorized representative who has indicated his/her understanding and acceptance.     Plan Discussed with:   Anesthesia Plan Comments:         Anesthesia Quick Evaluation

## 2016-03-31 NOTE — H&P (Signed)
Primary Care Physician:  Glendon Axe, MD Primary Gastroenterologist:  Dr. Vira Agar  Pre-Procedure History & Physical: HPI:  Katelyn Simmons is a 71 y.o. female is here for an endoscopy.   Past Medical History:  Diagnosis Date  . Abnormal finding on EKG   . Anemia   . Arthritis   . B12 deficiency   . CKD (chronic kidney disease)   . Collagen vascular disease ([facility])    RA  . Diabetes mellitus without complication ([place])   . Dysphagia   . GERD (gastroesophageal reflux disease)   . History of kidney stones   . Hx of repair of left rotator cuff   . Hypertension   . Hypothyroidism   . Obesity   . Pain   . Rotator cuff tear arthropathy of both shoulders   . Throat pain     Past Surgical History:  Procedure Laterality Date  . ABDOMINAL HYSTERECTOMY    . CHOLECYSTECTOMY    . endoscopic carpal tunnel release Bilateral   . ESOPHAGOGASTRODUODENOSCOPY      Prior to Admission medications   Medication Sig Start Date End Date Taking? Authorizing Provider  aspirin 81 MG chewable tablet Chew 81 mg by mouth daily.   Yes Historical Provider, MD  BIOTIN FORTE PO Take by mouth.   Yes Historical Provider, MD  Blood Glucose Monitoring Suppl (GMATE SMART METER KIT) DEVI by Does not apply route.   Yes Historical Provider, MD  Cholecalciferol (D3-1000) 1000 units capsule Take 1,000 Units by mouth daily.   Yes Historical Provider, MD  enalapril (VASOTEC) 5 MG tablet Take 5 mg by mouth daily.   Yes Historical Provider, MD  fenofibrate 160 MG tablet Take 160 mg by mouth daily.   Yes Historical Provider, MD  glimepiride (AMARYL) 4 MG tablet Take 4 mg by mouth daily with breakfast.   Yes Historical Provider, MD  glucose blood test strip 1 each by Other route as needed for other. Use as instructed   Yes Historical Provider, MD  hydrochlorothiazide (HYDRODIURIL) 25 MG tablet Take 25 mg by mouth daily.   Yes Historical Provider, MD  Lancets Misc. KIT by Does not apply route.   Yes Historical Provider,  MD  levothyroxine (SYNTHROID, LEVOTHROID) 150 MCG tablet Take 150 mcg by mouth daily before breakfast.   Yes Historical Provider, MD  metFORMIN (GLUCOPHAGE) 1000 MG tablet Take 1,000 mg by mouth 2 (two) times daily with a meal.   Yes Historical Provider, MD  metoprolol succinate (TOPROL-XL) 100 MG 24 hr tablet Take 100 mg by mouth daily. Take with or immediately following a meal.   Yes Historical Provider, MD  pantoprazole (PROTONIX) 40 MG tablet Take 40 mg by mouth daily.   Yes Historical Provider, MD  rosuvastatin (CRESTOR) 10 MG tablet Take 10 mg by mouth daily.   Yes Historical Provider, MD  traMADol (ULTRAM) 50 MG tablet Take 50 mg by mouth every 6 (six) hours as needed.   Yes Historical Provider, MD  vitamin B-12 (CYANOCOBALAMIN) 1000 MCG tablet Take 1,000 mcg by mouth daily.   Yes Historical Provider, MD    Allergies as of 03/08/2016 - Review Complete 01/07/2016  Allergen Reaction Noted  . Eggs or egg-derived products Nausea And Vomiting 10/16/2015  . Sulfa antibiotics Swelling 10/16/2015  . Codeine Other (See Comments) 10/16/2015    History reviewed. No pertinent family history.  Social History   Social History  . Marital status: Married    Spouse name: N/A  . Number of children: N/A  .  Years of education: N/A   Occupational History  . Not on file.   Social History Main Topics  . Smoking status: Never Smoker  . Smokeless tobacco: Never Used  . Alcohol use Yes     Comment: rare  . Drug use: No  . Sexual activity: Not on file   Other Topics Concern  . Not on file   Social History Narrative  . No narrative on file    Review of Systems: See HPI, otherwise negative ROS  Physical Exam: BP (!) 154/64   Pulse 72   Temp 99 F (37.2 C) (Tympanic)   Resp 18   Ht '5\' 2"'  (1.575 m)   Wt 79.4 kg (175 lb)   SpO2 100%   BMI 32.01 kg/m  General:   Alert,  pleasant and cooperative in NAD Head:  Normocephalic and atraumatic. Neck:  Supple; no masses or  thyromegaly. Lungs:  Clear throughout to auscultation.    Heart:  Regular rate and rhythm. Abdomen:  Soft, nontender and nondistended. Normal bowel sounds, without guarding, and without rebound.   Neurologic:  Alert and  oriented x4;  grossly normal neurologically.  Impression/Plan: Katelyn Simmons is here for an endoscopy to be performed for dysphagia  Risks, benefits, limitations, and alternatives regarding  endoscopy have been reviewed with the patient.  Questions have been answered.  All parties agreeable.   Gaylyn Cheers, MD  03/31/2016, 8:56 AM

## 2016-04-01 ENCOUNTER — Encounter: Payer: Self-pay | Admitting: Unknown Physician Specialty

## 2016-04-02 LAB — SURGICAL PATHOLOGY

## 2016-04-07 DIAGNOSIS — Z794 Long term (current) use of insulin: Secondary | ICD-10-CM | POA: Diagnosis not present

## 2016-04-07 DIAGNOSIS — E1122 Type 2 diabetes mellitus with diabetic chronic kidney disease: Secondary | ICD-10-CM | POA: Diagnosis not present

## 2016-04-07 DIAGNOSIS — N183 Chronic kidney disease, stage 3 (moderate): Secondary | ICD-10-CM | POA: Diagnosis not present

## 2016-04-07 DIAGNOSIS — I10 Essential (primary) hypertension: Secondary | ICD-10-CM | POA: Diagnosis not present

## 2016-04-07 DIAGNOSIS — E890 Postprocedural hypothyroidism: Secondary | ICD-10-CM | POA: Diagnosis not present

## 2016-04-07 DIAGNOSIS — E1165 Type 2 diabetes mellitus with hyperglycemia: Secondary | ICD-10-CM | POA: Diagnosis not present

## 2016-04-15 DIAGNOSIS — K219 Gastro-esophageal reflux disease without esophagitis: Secondary | ICD-10-CM | POA: Diagnosis not present

## 2016-04-15 DIAGNOSIS — N183 Chronic kidney disease, stage 3 (moderate): Secondary | ICD-10-CM | POA: Diagnosis not present

## 2016-04-15 DIAGNOSIS — E1165 Type 2 diabetes mellitus with hyperglycemia: Secondary | ICD-10-CM | POA: Diagnosis not present

## 2016-04-15 DIAGNOSIS — Z794 Long term (current) use of insulin: Secondary | ICD-10-CM | POA: Diagnosis not present

## 2016-04-15 DIAGNOSIS — E1122 Type 2 diabetes mellitus with diabetic chronic kidney disease: Secondary | ICD-10-CM | POA: Diagnosis not present

## 2016-04-15 DIAGNOSIS — K449 Diaphragmatic hernia without obstruction or gangrene: Secondary | ICD-10-CM | POA: Diagnosis not present

## 2016-04-15 DIAGNOSIS — I10 Essential (primary) hypertension: Secondary | ICD-10-CM | POA: Diagnosis not present

## 2016-04-15 DIAGNOSIS — F321 Major depressive disorder, single episode, moderate: Secondary | ICD-10-CM | POA: Diagnosis not present

## 2016-06-03 DIAGNOSIS — H43812 Vitreous degeneration, left eye: Secondary | ICD-10-CM | POA: Diagnosis not present

## 2016-06-03 DIAGNOSIS — E119 Type 2 diabetes mellitus without complications: Secondary | ICD-10-CM | POA: Diagnosis not present

## 2016-06-18 DIAGNOSIS — J0100 Acute maxillary sinusitis, unspecified: Secondary | ICD-10-CM | POA: Diagnosis not present

## 2016-06-18 DIAGNOSIS — R350 Frequency of micturition: Secondary | ICD-10-CM | POA: Diagnosis not present

## 2016-06-18 DIAGNOSIS — I10 Essential (primary) hypertension: Secondary | ICD-10-CM | POA: Diagnosis not present

## 2016-06-18 DIAGNOSIS — F321 Major depressive disorder, single episode, moderate: Secondary | ICD-10-CM | POA: Diagnosis not present

## 2016-07-06 DIAGNOSIS — H43813 Vitreous degeneration, bilateral: Secondary | ICD-10-CM | POA: Diagnosis not present

## 2016-07-21 DIAGNOSIS — E1165 Type 2 diabetes mellitus with hyperglycemia: Secondary | ICD-10-CM | POA: Diagnosis not present

## 2016-07-21 DIAGNOSIS — N183 Chronic kidney disease, stage 3 (moderate): Secondary | ICD-10-CM | POA: Diagnosis not present

## 2016-07-21 DIAGNOSIS — Z794 Long term (current) use of insulin: Secondary | ICD-10-CM | POA: Diagnosis not present

## 2016-07-21 DIAGNOSIS — E1122 Type 2 diabetes mellitus with diabetic chronic kidney disease: Secondary | ICD-10-CM | POA: Diagnosis not present

## 2016-07-28 DIAGNOSIS — I10 Essential (primary) hypertension: Secondary | ICD-10-CM | POA: Diagnosis not present

## 2016-07-28 DIAGNOSIS — Z0000 Encounter for general adult medical examination without abnormal findings: Secondary | ICD-10-CM | POA: Diagnosis not present

## 2016-07-28 DIAGNOSIS — N183 Chronic kidney disease, stage 3 (moderate): Secondary | ICD-10-CM | POA: Diagnosis not present

## 2016-07-28 DIAGNOSIS — F325 Major depressive disorder, single episode, in full remission: Secondary | ICD-10-CM | POA: Diagnosis not present

## 2016-07-28 DIAGNOSIS — Z780 Asymptomatic menopausal state: Secondary | ICD-10-CM | POA: Insufficient documentation

## 2016-07-28 DIAGNOSIS — E1122 Type 2 diabetes mellitus with diabetic chronic kidney disease: Secondary | ICD-10-CM | POA: Diagnosis not present

## 2016-07-28 DIAGNOSIS — F0280 Dementia in other diseases classified elsewhere without behavioral disturbance: Secondary | ICD-10-CM | POA: Diagnosis not present

## 2016-07-28 DIAGNOSIS — G301 Alzheimer's disease with late onset: Secondary | ICD-10-CM | POA: Diagnosis not present

## 2016-08-05 DIAGNOSIS — Z780 Asymptomatic menopausal state: Secondary | ICD-10-CM | POA: Diagnosis not present

## 2016-10-20 ENCOUNTER — Other Ambulatory Visit: Payer: Self-pay | Admitting: Internal Medicine

## 2016-10-20 DIAGNOSIS — Z1231 Encounter for screening mammogram for malignant neoplasm of breast: Secondary | ICD-10-CM

## 2016-10-20 DIAGNOSIS — R1031 Right lower quadrant pain: Secondary | ICD-10-CM | POA: Diagnosis not present

## 2016-10-20 DIAGNOSIS — R1032 Left lower quadrant pain: Secondary | ICD-10-CM | POA: Diagnosis not present

## 2016-10-20 DIAGNOSIS — I10 Essential (primary) hypertension: Secondary | ICD-10-CM | POA: Diagnosis not present

## 2016-10-20 DIAGNOSIS — E890 Postprocedural hypothyroidism: Secondary | ICD-10-CM | POA: Diagnosis not present

## 2016-10-22 DIAGNOSIS — E890 Postprocedural hypothyroidism: Secondary | ICD-10-CM | POA: Diagnosis not present

## 2016-10-22 DIAGNOSIS — R1031 Right lower quadrant pain: Secondary | ICD-10-CM | POA: Diagnosis not present

## 2016-10-22 DIAGNOSIS — R1032 Left lower quadrant pain: Secondary | ICD-10-CM | POA: Diagnosis not present

## 2016-10-22 DIAGNOSIS — I10 Essential (primary) hypertension: Secondary | ICD-10-CM | POA: Diagnosis not present

## 2016-10-25 ENCOUNTER — Other Ambulatory Visit: Payer: Self-pay | Admitting: Internal Medicine

## 2016-10-25 DIAGNOSIS — R1032 Left lower quadrant pain: Principal | ICD-10-CM

## 2016-10-25 DIAGNOSIS — R1031 Right lower quadrant pain: Secondary | ICD-10-CM

## 2016-10-26 ENCOUNTER — Ambulatory Visit
Admission: RE | Admit: 2016-10-26 | Discharge: 2016-10-26 | Disposition: A | Discharge status: Home / Self Care | Payer: Medicare HMO | Source: Ambulatory Visit | Attending: Internal Medicine | Admitting: Internal Medicine

## 2016-10-26 DIAGNOSIS — Z1231 Encounter for screening mammogram for malignant neoplasm of breast: Secondary | ICD-10-CM

## 2016-11-04 ENCOUNTER — Ambulatory Visit
Admission: RE | Admit: 2016-11-04 | Discharge: 2016-11-04 | Disposition: A | Discharge status: Home / Self Care | Payer: Medicare HMO | Source: Ambulatory Visit | Attending: Internal Medicine | Admitting: Internal Medicine

## 2016-11-04 ENCOUNTER — Ambulatory Visit: Payer: PPO

## 2016-11-04 DIAGNOSIS — R1032 Left lower quadrant pain: Secondary | ICD-10-CM | POA: Diagnosis present

## 2016-11-04 DIAGNOSIS — I700 Atherosclerosis of aorta: Secondary | ICD-10-CM | POA: Diagnosis not present

## 2016-11-04 DIAGNOSIS — K760 Fatty (change of) liver, not elsewhere classified: Secondary | ICD-10-CM | POA: Diagnosis not present

## 2016-11-04 DIAGNOSIS — R1031 Right lower quadrant pain: Secondary | ICD-10-CM | POA: Diagnosis present

## 2016-11-04 DIAGNOSIS — R197 Diarrhea, unspecified: Secondary | ICD-10-CM | POA: Diagnosis not present

## 2016-11-04 DIAGNOSIS — R1110 Vomiting, unspecified: Secondary | ICD-10-CM | POA: Diagnosis not present

## 2016-11-04 MED ORDER — IOPAMIDOL (ISOVUE-300) INJECTION 61%
100.0000 mL | Freq: Once | INTRAVENOUS | Status: AC | PRN
  Administered 2016-11-04: 100 mL via INTRAVENOUS

## 2016-11-11 DIAGNOSIS — K5792 Diverticulitis of intestine, part unspecified, without perforation or abscess without bleeding: Secondary | ICD-10-CM | POA: Diagnosis not present

## 2016-11-11 DIAGNOSIS — I10 Essential (primary) hypertension: Secondary | ICD-10-CM | POA: Diagnosis not present

## 2016-11-11 DIAGNOSIS — Z23 Encounter for immunization: Secondary | ICD-10-CM | POA: Diagnosis not present

## 2017-01-14 DIAGNOSIS — M25511 Pain in right shoulder: Secondary | ICD-10-CM | POA: Diagnosis not present

## 2017-01-14 DIAGNOSIS — M25512 Pain in left shoulder: Secondary | ICD-10-CM | POA: Diagnosis not present

## 2017-01-14 DIAGNOSIS — M79642 Pain in left hand: Secondary | ICD-10-CM | POA: Diagnosis not present

## 2017-01-14 DIAGNOSIS — G8929 Other chronic pain: Secondary | ICD-10-CM | POA: Diagnosis not present

## 2017-01-14 DIAGNOSIS — M79641 Pain in right hand: Secondary | ICD-10-CM | POA: Diagnosis not present

## 2017-01-14 DIAGNOSIS — M1990 Unspecified osteoarthritis, unspecified site: Secondary | ICD-10-CM | POA: Diagnosis not present

## 2017-02-14 DIAGNOSIS — M79642 Pain in left hand: Secondary | ICD-10-CM | POA: Diagnosis not present

## 2017-02-14 DIAGNOSIS — M1990 Unspecified osteoarthritis, unspecified site: Secondary | ICD-10-CM | POA: Diagnosis not present

## 2017-02-14 DIAGNOSIS — M79641 Pain in right hand: Secondary | ICD-10-CM | POA: Diagnosis not present

## 2017-02-24 ENCOUNTER — Other Ambulatory Visit: Payer: Self-pay | Admitting: Internal Medicine

## 2017-02-24 DIAGNOSIS — N183 Chronic kidney disease, stage 3 (moderate): Secondary | ICD-10-CM | POA: Diagnosis not present

## 2017-02-24 DIAGNOSIS — E1122 Type 2 diabetes mellitus with diabetic chronic kidney disease: Secondary | ICD-10-CM | POA: Diagnosis not present

## 2017-02-24 DIAGNOSIS — I10 Essential (primary) hypertension: Secondary | ICD-10-CM | POA: Diagnosis not present

## 2017-02-24 DIAGNOSIS — F411 Generalized anxiety disorder: Secondary | ICD-10-CM | POA: Diagnosis not present

## 2017-02-24 DIAGNOSIS — M5416 Radiculopathy, lumbar region: Secondary | ICD-10-CM

## 2017-03-03 ENCOUNTER — Ambulatory Visit
Admission: RE | Admit: 2017-03-03 | Discharge: 2017-03-03 | Disposition: A | Discharge status: Home / Self Care | Payer: Medicare HMO | Source: Ambulatory Visit | Attending: Internal Medicine | Admitting: Internal Medicine

## 2017-03-03 DIAGNOSIS — M5416 Radiculopathy, lumbar region: Secondary | ICD-10-CM | POA: Diagnosis not present

## 2017-03-03 DIAGNOSIS — M5126 Other intervertebral disc displacement, lumbar region: Secondary | ICD-10-CM | POA: Insufficient documentation

## 2017-03-03 DIAGNOSIS — M48061 Spinal stenosis, lumbar region without neurogenic claudication: Secondary | ICD-10-CM | POA: Insufficient documentation

## 2017-03-08 DIAGNOSIS — M47816 Spondylosis without myelopathy or radiculopathy, lumbar region: Secondary | ICD-10-CM | POA: Diagnosis not present

## 2017-03-08 DIAGNOSIS — M5416 Radiculopathy, lumbar region: Secondary | ICD-10-CM | POA: Diagnosis not present

## 2017-03-08 DIAGNOSIS — M48062 Spinal stenosis, lumbar region with neurogenic claudication: Secondary | ICD-10-CM | POA: Diagnosis not present

## 2017-03-08 DIAGNOSIS — M5136 Other intervertebral disc degeneration, lumbar region: Secondary | ICD-10-CM | POA: Diagnosis not present

## 2017-03-15 DIAGNOSIS — M545 Low back pain: Secondary | ICD-10-CM | POA: Diagnosis not present

## 2017-03-15 DIAGNOSIS — M461 Sacroiliitis, not elsewhere classified: Secondary | ICD-10-CM | POA: Diagnosis not present

## 2017-03-15 DIAGNOSIS — M4807 Spinal stenosis, lumbosacral region: Secondary | ICD-10-CM | POA: Diagnosis not present

## 2017-03-22 DIAGNOSIS — M461 Sacroiliitis, not elsewhere classified: Secondary | ICD-10-CM | POA: Diagnosis not present

## 2017-03-22 DIAGNOSIS — M545 Low back pain: Secondary | ICD-10-CM | POA: Diagnosis not present

## 2017-03-22 DIAGNOSIS — M4807 Spinal stenosis, lumbosacral region: Secondary | ICD-10-CM | POA: Diagnosis not present

## 2017-03-29 DIAGNOSIS — M461 Sacroiliitis, not elsewhere classified: Secondary | ICD-10-CM | POA: Diagnosis not present

## 2017-03-29 DIAGNOSIS — M4807 Spinal stenosis, lumbosacral region: Secondary | ICD-10-CM | POA: Diagnosis not present

## 2017-03-29 DIAGNOSIS — M545 Low back pain: Secondary | ICD-10-CM | POA: Diagnosis not present

## 2017-04-04 DIAGNOSIS — H109 Unspecified conjunctivitis: Secondary | ICD-10-CM | POA: Diagnosis not present

## 2017-04-04 DIAGNOSIS — N183 Chronic kidney disease, stage 3 (moderate): Secondary | ICD-10-CM | POA: Diagnosis not present

## 2017-04-04 DIAGNOSIS — E1122 Type 2 diabetes mellitus with diabetic chronic kidney disease: Secondary | ICD-10-CM | POA: Diagnosis not present

## 2017-04-05 DIAGNOSIS — M4807 Spinal stenosis, lumbosacral region: Secondary | ICD-10-CM | POA: Diagnosis not present

## 2017-04-05 DIAGNOSIS — M461 Sacroiliitis, not elsewhere classified: Secondary | ICD-10-CM | POA: Diagnosis not present

## 2017-04-05 DIAGNOSIS — M545 Low back pain: Secondary | ICD-10-CM | POA: Diagnosis not present

## 2017-04-07 DIAGNOSIS — H169 Unspecified keratitis: Secondary | ICD-10-CM | POA: Diagnosis not present

## 2017-04-12 DIAGNOSIS — H169 Unspecified keratitis: Secondary | ICD-10-CM | POA: Diagnosis not present

## 2017-04-19 DIAGNOSIS — M47816 Spondylosis without myelopathy or radiculopathy, lumbar region: Secondary | ICD-10-CM | POA: Diagnosis not present

## 2017-04-19 DIAGNOSIS — M5416 Radiculopathy, lumbar region: Secondary | ICD-10-CM | POA: Diagnosis not present

## 2017-04-19 DIAGNOSIS — M5136 Other intervertebral disc degeneration, lumbar region: Secondary | ICD-10-CM | POA: Diagnosis not present

## 2017-04-19 DIAGNOSIS — M48062 Spinal stenosis, lumbar region with neurogenic claudication: Secondary | ICD-10-CM | POA: Diagnosis not present

## 2017-04-22 DIAGNOSIS — M1990 Unspecified osteoarthritis, unspecified site: Secondary | ICD-10-CM | POA: Diagnosis not present

## 2017-05-19 DIAGNOSIS — N183 Chronic kidney disease, stage 3 (moderate): Secondary | ICD-10-CM | POA: Diagnosis not present

## 2017-05-19 DIAGNOSIS — I10 Essential (primary) hypertension: Secondary | ICD-10-CM | POA: Diagnosis not present

## 2017-05-19 DIAGNOSIS — E1122 Type 2 diabetes mellitus with diabetic chronic kidney disease: Secondary | ICD-10-CM | POA: Diagnosis not present

## 2017-05-27 DIAGNOSIS — E1122 Type 2 diabetes mellitus with diabetic chronic kidney disease: Secondary | ICD-10-CM | POA: Diagnosis not present

## 2017-05-27 DIAGNOSIS — Z0000 Encounter for general adult medical examination without abnormal findings: Secondary | ICD-10-CM | POA: Diagnosis not present

## 2017-05-27 DIAGNOSIS — N183 Chronic kidney disease, stage 3 (moderate): Secondary | ICD-10-CM | POA: Diagnosis not present

## 2017-05-27 DIAGNOSIS — F325 Major depressive disorder, single episode, in full remission: Secondary | ICD-10-CM | POA: Diagnosis not present

## 2017-05-27 DIAGNOSIS — E782 Mixed hyperlipidemia: Secondary | ICD-10-CM | POA: Diagnosis not present

## 2017-06-28 DIAGNOSIS — H18821 Corneal disorder due to contact lens, right eye: Secondary | ICD-10-CM | POA: Diagnosis not present

## 2017-07-04 DIAGNOSIS — H16223 Keratoconjunctivitis sicca, not specified as Sjogren's, bilateral: Secondary | ICD-10-CM | POA: Diagnosis not present

## 2017-07-12 DIAGNOSIS — H16229 Keratoconjunctivitis sicca, not specified as Sjogren's, unspecified eye: Secondary | ICD-10-CM | POA: Diagnosis not present

## 2017-07-12 DIAGNOSIS — H16221 Keratoconjunctivitis sicca, not specified as Sjogren's, right eye: Secondary | ICD-10-CM | POA: Diagnosis not present

## 2017-07-28 DIAGNOSIS — H16223 Keratoconjunctivitis sicca, not specified as Sjogren's, bilateral: Secondary | ICD-10-CM | POA: Diagnosis not present

## 2017-08-11 DIAGNOSIS — H16223 Keratoconjunctivitis sicca, not specified as Sjogren's, bilateral: Secondary | ICD-10-CM | POA: Diagnosis not present

## 2017-08-11 DIAGNOSIS — H5213 Myopia, bilateral: Secondary | ICD-10-CM | POA: Diagnosis not present

## 2017-08-24 DIAGNOSIS — Z0100 Encounter for examination of eyes and vision without abnormal findings: Secondary | ICD-10-CM | POA: Diagnosis not present

## 2017-08-24 DIAGNOSIS — E1122 Type 2 diabetes mellitus with diabetic chronic kidney disease: Secondary | ICD-10-CM | POA: Diagnosis not present

## 2017-08-24 DIAGNOSIS — N183 Chronic kidney disease, stage 3 (moderate): Secondary | ICD-10-CM | POA: Diagnosis not present

## 2017-09-05 ENCOUNTER — Other Ambulatory Visit: Payer: Self-pay | Admitting: Pharmacist

## 2017-09-05 NOTE — Patient Outreach (Signed)
[facility]) Care Management  09/05/2017  Katelyn Simmons 1945-12-27 [id number]   Incoming call from [place] in response to the EMMI Medication Adherence Campaign. Speak with patient. HIPAA identifiers verified and verbal consent received.  [name] reports that she takes enalapril 5 mg twice daily as directed. However, reports that per instruction from Dr. Candiss Norse, she holds her evening dose on days when her diastolic blood pressure falls below 55. Reports that this does not happen often, but that she follows this direction from Dr. Candiss Norse when it becomes this low. Denies any barriers to medication adherence.  Reports that she has been taking her Vitamin B12 supplement daily as directed by her PCP. Reports that she does notice an effect on her mood if she does not take the supplement. Reports that she and her PCP have discussed the importance of her taking this supplement for this effect and to avoid anemia and that her PCP checked her blood work at her last appointment. Reports that she has a follow up appointment coming up.  [name] denies any further medication questions/concerns at this time.  Will close pharmacy episode.  Harlow Asa, PharmD, [facility] [phone]

## 2017-09-12 DIAGNOSIS — R202 Paresthesia of skin: Secondary | ICD-10-CM | POA: Diagnosis not present

## 2017-09-12 DIAGNOSIS — N183 Chronic kidney disease, stage 3 (moderate): Secondary | ICD-10-CM | POA: Diagnosis not present

## 2017-09-12 DIAGNOSIS — E1122 Type 2 diabetes mellitus with diabetic chronic kidney disease: Secondary | ICD-10-CM | POA: Diagnosis not present

## 2017-11-01 DIAGNOSIS — N183 Chronic kidney disease, stage 3 (moderate): Secondary | ICD-10-CM | POA: Diagnosis not present

## 2017-11-01 DIAGNOSIS — E1122 Type 2 diabetes mellitus with diabetic chronic kidney disease: Secondary | ICD-10-CM | POA: Diagnosis not present

## 2017-11-01 DIAGNOSIS — M48061 Spinal stenosis, lumbar region without neurogenic claudication: Secondary | ICD-10-CM | POA: Diagnosis not present

## 2017-11-29 DIAGNOSIS — M4316 Spondylolisthesis, lumbar region: Secondary | ICD-10-CM | POA: Diagnosis not present

## 2017-11-29 DIAGNOSIS — M48062 Spinal stenosis, lumbar region with neurogenic claudication: Secondary | ICD-10-CM | POA: Diagnosis not present

## 2017-11-29 DIAGNOSIS — M5136 Other intervertebral disc degeneration, lumbar region: Secondary | ICD-10-CM | POA: Diagnosis not present

## 2017-11-29 DIAGNOSIS — M47816 Spondylosis without myelopathy or radiculopathy, lumbar region: Secondary | ICD-10-CM | POA: Diagnosis not present

## 2017-11-29 DIAGNOSIS — M4310 Spondylolisthesis, site unspecified: Secondary | ICD-10-CM | POA: Diagnosis not present

## 2017-12-05 DIAGNOSIS — M47816 Spondylosis without myelopathy or radiculopathy, lumbar region: Secondary | ICD-10-CM | POA: Diagnosis not present

## 2017-12-05 DIAGNOSIS — M48062 Spinal stenosis, lumbar region with neurogenic claudication: Secondary | ICD-10-CM | POA: Diagnosis not present

## 2017-12-05 DIAGNOSIS — M5416 Radiculopathy, lumbar region: Secondary | ICD-10-CM | POA: Diagnosis not present

## 2017-12-05 DIAGNOSIS — M5136 Other intervertebral disc degeneration, lumbar region: Secondary | ICD-10-CM | POA: Diagnosis not present

## 2017-12-08 DIAGNOSIS — N183 Chronic kidney disease, stage 3 (moderate): Secondary | ICD-10-CM | POA: Diagnosis not present

## 2017-12-08 DIAGNOSIS — M47816 Spondylosis without myelopathy or radiculopathy, lumbar region: Secondary | ICD-10-CM | POA: Diagnosis not present

## 2017-12-08 DIAGNOSIS — R202 Paresthesia of skin: Secondary | ICD-10-CM | POA: Diagnosis not present

## 2017-12-08 DIAGNOSIS — E1122 Type 2 diabetes mellitus with diabetic chronic kidney disease: Secondary | ICD-10-CM | POA: Diagnosis not present

## 2017-12-09 ENCOUNTER — Other Ambulatory Visit: Payer: Self-pay | Admitting: Internal Medicine

## 2017-12-09 DIAGNOSIS — Z1231 Encounter for screening mammogram for malignant neoplasm of breast: Secondary | ICD-10-CM

## 2017-12-13 DIAGNOSIS — M6281 Muscle weakness (generalized): Secondary | ICD-10-CM | POA: Diagnosis not present

## 2017-12-13 DIAGNOSIS — M48062 Spinal stenosis, lumbar region with neurogenic claudication: Secondary | ICD-10-CM | POA: Diagnosis not present

## 2017-12-13 DIAGNOSIS — M2560 Stiffness of unspecified joint, not elsewhere classified: Secondary | ICD-10-CM | POA: Diagnosis not present

## 2017-12-13 DIAGNOSIS — M5136 Other intervertebral disc degeneration, lumbar region: Secondary | ICD-10-CM | POA: Diagnosis not present

## 2017-12-15 DIAGNOSIS — E1122 Type 2 diabetes mellitus with diabetic chronic kidney disease: Secondary | ICD-10-CM | POA: Diagnosis not present

## 2017-12-15 DIAGNOSIS — F5104 Psychophysiologic insomnia: Secondary | ICD-10-CM | POA: Diagnosis not present

## 2017-12-15 DIAGNOSIS — E890 Postprocedural hypothyroidism: Secondary | ICD-10-CM | POA: Diagnosis not present

## 2017-12-15 DIAGNOSIS — N182 Chronic kidney disease, stage 2 (mild): Secondary | ICD-10-CM | POA: Diagnosis not present

## 2017-12-15 DIAGNOSIS — F325 Major depressive disorder, single episode, in full remission: Secondary | ICD-10-CM | POA: Diagnosis not present

## 2017-12-17 DIAGNOSIS — E890 Postprocedural hypothyroidism: Secondary | ICD-10-CM | POA: Insufficient documentation

## 2018-01-10 ENCOUNTER — Ambulatory Visit
Admission: RE | Admit: 2018-01-10 | Discharge: 2018-01-10 | Disposition: A | Discharge status: Home / Self Care | Payer: Medicare HMO | Source: Ambulatory Visit | Attending: Internal Medicine | Admitting: Internal Medicine

## 2018-01-10 DIAGNOSIS — Z1231 Encounter for screening mammogram for malignant neoplasm of breast: Secondary | ICD-10-CM | POA: Insufficient documentation

## 2018-01-11 DIAGNOSIS — M47816 Spondylosis without myelopathy or radiculopathy, lumbar region: Secondary | ICD-10-CM | POA: Diagnosis not present

## 2018-02-13 DIAGNOSIS — M5416 Radiculopathy, lumbar region: Secondary | ICD-10-CM | POA: Diagnosis not present

## 2018-02-13 DIAGNOSIS — M5136 Other intervertebral disc degeneration, lumbar region: Secondary | ICD-10-CM | POA: Diagnosis not present

## 2018-02-13 DIAGNOSIS — M48062 Spinal stenosis, lumbar region with neurogenic claudication: Secondary | ICD-10-CM | POA: Diagnosis not present

## 2018-02-13 DIAGNOSIS — M47816 Spondylosis without myelopathy or radiculopathy, lumbar region: Secondary | ICD-10-CM | POA: Diagnosis not present

## 2018-03-06 DIAGNOSIS — K529 Noninfective gastroenteritis and colitis, unspecified: Secondary | ICD-10-CM | POA: Diagnosis not present

## 2018-03-06 DIAGNOSIS — K760 Fatty (change of) liver, not elsewhere classified: Secondary | ICD-10-CM | POA: Diagnosis not present

## 2018-03-06 DIAGNOSIS — R1312 Dysphagia, oropharyngeal phase: Secondary | ICD-10-CM | POA: Diagnosis not present

## 2018-03-06 DIAGNOSIS — K2270 Barrett's esophagus without dysplasia: Secondary | ICD-10-CM | POA: Diagnosis not present

## 2018-03-07 ENCOUNTER — Other Ambulatory Visit
Admission: RE | Admit: 2018-03-07 | Discharge: 2018-03-07 | Disposition: A | Discharge status: Home / Self Care | Payer: Medicare HMO | Source: Ambulatory Visit | Attending: Student | Admitting: Student

## 2018-03-07 DIAGNOSIS — K529 Noninfective gastroenteritis and colitis, unspecified: Secondary | ICD-10-CM | POA: Diagnosis not present

## 2018-03-07 DIAGNOSIS — H35372 Puckering of macula, left eye: Secondary | ICD-10-CM | POA: Diagnosis not present

## 2018-03-07 LAB — C DIFFICILE QUICK SCREEN W PCR REFLEX
C Diff antigen: NEGATIVE
C Diff interpretation: NOT DETECTED
C Diff toxin: NEGATIVE

## 2018-03-07 LAB — GASTROINTESTINAL PANEL BY PCR, STOOL (REPLACES STOOL CULTURE)

## 2018-03-11 LAB — CALPROTECTIN, FECAL: Calprotectin, Fecal: 94 ug/g (ref 0–120)

## 2018-03-13 LAB — PANCREATIC ELASTASE, FECAL: Pancreatic Elastase-1, Stool: 172 ug Elast./g — ABNORMAL LOW (ref >200)

## 2018-03-17 DIAGNOSIS — E1122 Type 2 diabetes mellitus with diabetic chronic kidney disease: Secondary | ICD-10-CM | POA: Diagnosis not present

## 2018-03-17 DIAGNOSIS — N182 Chronic kidney disease, stage 2 (mild): Secondary | ICD-10-CM | POA: Diagnosis not present

## 2018-03-23 ENCOUNTER — Other Ambulatory Visit: Payer: Self-pay | Admitting: Student

## 2018-03-23 DIAGNOSIS — K8689 Other specified diseases of pancreas: Secondary | ICD-10-CM

## 2018-03-23 DIAGNOSIS — K529 Noninfective gastroenteritis and colitis, unspecified: Secondary | ICD-10-CM

## 2018-03-24 DIAGNOSIS — J0100 Acute maxillary sinusitis, unspecified: Secondary | ICD-10-CM | POA: Diagnosis not present

## 2018-03-24 DIAGNOSIS — E890 Postprocedural hypothyroidism: Secondary | ICD-10-CM | POA: Diagnosis not present

## 2018-03-24 DIAGNOSIS — D649 Anemia, unspecified: Secondary | ICD-10-CM | POA: Diagnosis not present

## 2018-03-24 DIAGNOSIS — N183 Chronic kidney disease, stage 3 (moderate): Secondary | ICD-10-CM | POA: Diagnosis not present

## 2018-03-24 DIAGNOSIS — F325 Major depressive disorder, single episode, in full remission: Secondary | ICD-10-CM | POA: Diagnosis not present

## 2018-03-29 DIAGNOSIS — H35363 Drusen (degenerative) of macula, bilateral: Secondary | ICD-10-CM | POA: Diagnosis not present

## 2018-03-29 DIAGNOSIS — H16223 Keratoconjunctivitis sicca, not specified as Sjogren's, bilateral: Secondary | ICD-10-CM | POA: Diagnosis not present

## 2018-03-30 ENCOUNTER — Ambulatory Visit
Admission: RE | Admit: 2018-03-30 | Discharge: 2018-03-30 | Disposition: A | Discharge status: Home / Self Care | Payer: Medicare HMO | Source: Ambulatory Visit | Attending: Student | Admitting: Student

## 2018-03-30 DIAGNOSIS — K529 Noninfective gastroenteritis and colitis, unspecified: Secondary | ICD-10-CM | POA: Insufficient documentation

## 2018-03-30 DIAGNOSIS — K8689 Other specified diseases of pancreas: Secondary | ICD-10-CM | POA: Diagnosis not present

## 2018-03-30 DIAGNOSIS — K760 Fatty (change of) liver, not elsewhere classified: Secondary | ICD-10-CM | POA: Diagnosis not present

## 2018-03-30 MED ORDER — GADOBUTROL 1 MMOL/ML IV SOLN
7.0000 mL | Freq: Once | INTRAVENOUS | Status: AC | PRN
  Administered 2018-03-30: 7 mL via INTRAVENOUS

## 2018-04-06 DIAGNOSIS — H16223 Keratoconjunctivitis sicca, not specified as Sjogren's, bilateral: Secondary | ICD-10-CM | POA: Diagnosis not present

## 2018-05-08 ENCOUNTER — Encounter: Admission: RE | Payer: Self-pay | Source: Home / Self Care

## 2018-05-08 ENCOUNTER — Ambulatory Visit
Admission: RE | Admit: 2018-05-08 | Payer: Medicare HMO | Source: Home / Self Care | Admitting: Unknown Physician Specialty

## 2018-05-08 SURGERY — COLONOSCOPY WITH PROPOFOL
Anesthesia: General

## 2018-05-10 DIAGNOSIS — M47816 Spondylosis without myelopathy or radiculopathy, lumbar region: Secondary | ICD-10-CM | POA: Diagnosis not present

## 2018-05-22 DIAGNOSIS — M48062 Spinal stenosis, lumbar region with neurogenic claudication: Secondary | ICD-10-CM | POA: Diagnosis not present

## 2018-05-22 DIAGNOSIS — M47816 Spondylosis without myelopathy or radiculopathy, lumbar region: Secondary | ICD-10-CM | POA: Diagnosis not present

## 2018-05-22 DIAGNOSIS — M5416 Radiculopathy, lumbar region: Secondary | ICD-10-CM | POA: Diagnosis not present

## 2018-05-22 DIAGNOSIS — M5136 Other intervertebral disc degeneration, lumbar region: Secondary | ICD-10-CM | POA: Diagnosis not present

## 2018-05-26 DIAGNOSIS — H16223 Keratoconjunctivitis sicca, not specified as Sjogren's, bilateral: Secondary | ICD-10-CM | POA: Diagnosis not present

## 2018-05-30 DIAGNOSIS — N183 Chronic kidney disease, stage 3 (moderate): Secondary | ICD-10-CM | POA: Diagnosis not present

## 2018-05-30 DIAGNOSIS — E890 Postprocedural hypothyroidism: Secondary | ICD-10-CM | POA: Diagnosis not present

## 2018-06-02 DIAGNOSIS — H16223 Keratoconjunctivitis sicca, not specified as Sjogren's, bilateral: Secondary | ICD-10-CM | POA: Diagnosis not present

## 2018-06-06 DIAGNOSIS — Z794 Long term (current) use of insulin: Secondary | ICD-10-CM | POA: Diagnosis not present

## 2018-06-06 DIAGNOSIS — E1122 Type 2 diabetes mellitus with diabetic chronic kidney disease: Secondary | ICD-10-CM | POA: Diagnosis not present

## 2018-06-06 DIAGNOSIS — M5416 Radiculopathy, lumbar region: Secondary | ICD-10-CM | POA: Diagnosis not present

## 2018-06-06 DIAGNOSIS — E782 Mixed hyperlipidemia: Secondary | ICD-10-CM | POA: Diagnosis not present

## 2018-06-06 DIAGNOSIS — E1165 Type 2 diabetes mellitus with hyperglycemia: Secondary | ICD-10-CM | POA: Diagnosis not present

## 2018-06-06 DIAGNOSIS — F325 Major depressive disorder, single episode, in full remission: Secondary | ICD-10-CM | POA: Diagnosis not present

## 2018-06-06 DIAGNOSIS — I10 Essential (primary) hypertension: Secondary | ICD-10-CM | POA: Diagnosis not present

## 2018-06-06 DIAGNOSIS — N183 Chronic kidney disease, stage 3 (moderate): Secondary | ICD-10-CM | POA: Diagnosis not present

## 2018-06-06 DIAGNOSIS — E890 Postprocedural hypothyroidism: Secondary | ICD-10-CM | POA: Diagnosis not present

## 2018-06-09 DIAGNOSIS — H04123 Dry eye syndrome of bilateral lacrimal glands: Secondary | ICD-10-CM | POA: Diagnosis not present

## 2018-06-13 ENCOUNTER — Telehealth: Payer: Self-pay | Admitting: *Deleted

## 2018-06-13 NOTE — Telephone Encounter (Signed)
Copied from [place] [id number]. Topic: General - Inquiry >> Jun 13, 2018 12:26 PM Richardo Priest, NT wrote: Reason for CRM: Patient is calling in stating her sister is a patient of Dr.Scott and is wondering if Dr.Scott will accept her as a new patient. Informed her she is not accepting new patients at this time and she would like someone to check to make sure. Call back is [phone].

## 2018-06-14 DIAGNOSIS — M47816 Spondylosis without myelopathy or radiculopathy, lumbar region: Secondary | ICD-10-CM | POA: Diagnosis not present

## 2018-06-20 NOTE — Telephone Encounter (Signed)
Pt aware Dr Nicki Reaper is not taking new patients. She is already established with someone else.

## 2018-06-28 DIAGNOSIS — H1711 Central corneal opacity, right eye: Secondary | ICD-10-CM | POA: Diagnosis not present

## 2018-06-28 DIAGNOSIS — H35372 Puckering of macula, left eye: Secondary | ICD-10-CM | POA: Diagnosis not present

## 2018-06-28 DIAGNOSIS — H04123 Dry eye syndrome of bilateral lacrimal glands: Secondary | ICD-10-CM | POA: Diagnosis not present

## 2018-06-28 DIAGNOSIS — H3530 Unspecified macular degeneration: Secondary | ICD-10-CM | POA: Diagnosis not present

## 2018-06-28 DIAGNOSIS — H25813 Combined forms of age-related cataract, bilateral: Secondary | ICD-10-CM | POA: Diagnosis not present

## 2018-06-28 DIAGNOSIS — H179 Unspecified corneal scar and opacity: Secondary | ICD-10-CM | POA: Diagnosis not present

## 2018-06-30 DIAGNOSIS — E1122 Type 2 diabetes mellitus with diabetic chronic kidney disease: Secondary | ICD-10-CM | POA: Diagnosis not present

## 2018-06-30 DIAGNOSIS — Z794 Long term (current) use of insulin: Secondary | ICD-10-CM | POA: Diagnosis not present

## 2018-06-30 DIAGNOSIS — H04123 Dry eye syndrome of bilateral lacrimal glands: Secondary | ICD-10-CM | POA: Diagnosis not present

## 2018-06-30 DIAGNOSIS — Z79899 Other long term (current) drug therapy: Secondary | ICD-10-CM | POA: Diagnosis not present

## 2018-06-30 DIAGNOSIS — E782 Mixed hyperlipidemia: Secondary | ICD-10-CM | POA: Diagnosis not present

## 2018-06-30 DIAGNOSIS — N183 Chronic kidney disease, stage 3 (moderate): Secondary | ICD-10-CM | POA: Diagnosis not present

## 2018-06-30 DIAGNOSIS — E1165 Type 2 diabetes mellitus with hyperglycemia: Secondary | ICD-10-CM | POA: Diagnosis not present

## 2018-06-30 DIAGNOSIS — I10 Essential (primary) hypertension: Secondary | ICD-10-CM | POA: Diagnosis not present

## 2018-07-07 DIAGNOSIS — N183 Chronic kidney disease, stage 3 (moderate): Secondary | ICD-10-CM | POA: Diagnosis not present

## 2018-07-07 DIAGNOSIS — R0600 Dyspnea, unspecified: Secondary | ICD-10-CM | POA: Diagnosis not present

## 2018-07-07 DIAGNOSIS — Z79899 Other long term (current) drug therapy: Secondary | ICD-10-CM | POA: Diagnosis not present

## 2018-07-07 DIAGNOSIS — E538 Deficiency of other specified B group vitamins: Secondary | ICD-10-CM | POA: Diagnosis not present

## 2018-07-07 DIAGNOSIS — E782 Mixed hyperlipidemia: Secondary | ICD-10-CM | POA: Diagnosis not present

## 2018-07-07 DIAGNOSIS — E890 Postprocedural hypothyroidism: Secondary | ICD-10-CM | POA: Diagnosis not present

## 2018-07-07 DIAGNOSIS — I10 Essential (primary) hypertension: Secondary | ICD-10-CM | POA: Diagnosis not present

## 2018-07-07 DIAGNOSIS — Z0000 Encounter for general adult medical examination without abnormal findings: Secondary | ICD-10-CM | POA: Diagnosis not present

## 2018-07-07 DIAGNOSIS — E118 Type 2 diabetes mellitus with unspecified complications: Secondary | ICD-10-CM | POA: Diagnosis not present

## 2018-07-11 DIAGNOSIS — R002 Palpitations: Secondary | ICD-10-CM | POA: Diagnosis not present

## 2018-07-11 DIAGNOSIS — R0600 Dyspnea, unspecified: Secondary | ICD-10-CM | POA: Diagnosis not present

## 2018-07-19 DIAGNOSIS — H1852 Epithelial (juvenile) corneal dystrophy: Secondary | ICD-10-CM | POA: Diagnosis not present

## 2018-07-26 DIAGNOSIS — M069 Rheumatoid arthritis, unspecified: Secondary | ICD-10-CM | POA: Diagnosis not present

## 2018-07-26 DIAGNOSIS — F0390 Unspecified dementia without behavioral disturbance: Secondary | ICD-10-CM | POA: Diagnosis not present

## 2018-07-26 DIAGNOSIS — M5136 Other intervertebral disc degeneration, lumbar region: Secondary | ICD-10-CM | POA: Diagnosis not present

## 2018-07-26 DIAGNOSIS — M48062 Spinal stenosis, lumbar region with neurogenic claudication: Secondary | ICD-10-CM | POA: Diagnosis not present

## 2018-07-26 DIAGNOSIS — M47816 Spondylosis without myelopathy or radiculopathy, lumbar region: Secondary | ICD-10-CM | POA: Diagnosis not present

## 2018-07-26 DIAGNOSIS — M5416 Radiculopathy, lumbar region: Secondary | ICD-10-CM | POA: Diagnosis not present

## 2018-08-14 ENCOUNTER — Other Ambulatory Visit: Payer: Self-pay

## 2018-08-14 ENCOUNTER — Other Ambulatory Visit: Payer: Self-pay | Admitting: Physical Medicine and Rehabilitation

## 2018-08-14 DIAGNOSIS — M5416 Radiculopathy, lumbar region: Secondary | ICD-10-CM

## 2018-08-23 ENCOUNTER — Other Ambulatory Visit: Payer: Self-pay

## 2018-08-23 ENCOUNTER — Ambulatory Visit
Admission: RE | Admit: 2018-08-23 | Discharge: 2018-08-23 | Disposition: A | Discharge status: Home / Self Care | Payer: Medicare HMO | Source: Ambulatory Visit | Attending: Physical Medicine and Rehabilitation | Admitting: Physical Medicine and Rehabilitation

## 2018-08-23 DIAGNOSIS — M5416 Radiculopathy, lumbar region: Secondary | ICD-10-CM | POA: Diagnosis not present

## 2018-08-23 DIAGNOSIS — M545 Low back pain: Secondary | ICD-10-CM | POA: Diagnosis not present

## 2018-08-25 DIAGNOSIS — E890 Postprocedural hypothyroidism: Secondary | ICD-10-CM | POA: Diagnosis not present

## 2018-08-31 DIAGNOSIS — E119 Type 2 diabetes mellitus without complications: Secondary | ICD-10-CM | POA: Diagnosis not present

## 2018-08-31 DIAGNOSIS — M48062 Spinal stenosis, lumbar region with neurogenic claudication: Secondary | ICD-10-CM | POA: Diagnosis not present

## 2018-08-31 DIAGNOSIS — M4310 Spondylolisthesis, site unspecified: Secondary | ICD-10-CM | POA: Diagnosis not present

## 2018-08-31 DIAGNOSIS — E890 Postprocedural hypothyroidism: Secondary | ICD-10-CM | POA: Diagnosis not present

## 2018-08-31 DIAGNOSIS — E669 Obesity, unspecified: Secondary | ICD-10-CM | POA: Diagnosis not present

## 2018-09-26 ENCOUNTER — Other Ambulatory Visit: Payer: Self-pay | Admitting: Neurosurgery

## 2018-09-29 DIAGNOSIS — E119 Type 2 diabetes mellitus without complications: Secondary | ICD-10-CM | POA: Diagnosis not present

## 2018-09-29 DIAGNOSIS — Z01812 Encounter for preprocedural laboratory examination: Secondary | ICD-10-CM | POA: Diagnosis not present

## 2018-09-29 DIAGNOSIS — Z79899 Other long term (current) drug therapy: Secondary | ICD-10-CM | POA: Diagnosis not present

## 2018-09-29 DIAGNOSIS — E782 Mixed hyperlipidemia: Secondary | ICD-10-CM | POA: Diagnosis not present

## 2018-09-29 DIAGNOSIS — E890 Postprocedural hypothyroidism: Secondary | ICD-10-CM | POA: Diagnosis not present

## 2018-10-03 HISTORY — PX: UPPER GI ENDOSCOPY: SHX6162

## 2018-10-05 DIAGNOSIS — K2270 Barrett's esophagus without dysplasia: Secondary | ICD-10-CM | POA: Diagnosis not present

## 2018-10-05 DIAGNOSIS — K5669 Other partial intestinal obstruction: Secondary | ICD-10-CM | POA: Diagnosis not present

## 2018-10-05 DIAGNOSIS — R197 Diarrhea, unspecified: Secondary | ICD-10-CM | POA: Diagnosis not present

## 2018-10-05 DIAGNOSIS — K5140 Inflammatory polyps of colon without complications: Secondary | ICD-10-CM | POA: Diagnosis not present

## 2018-10-05 DIAGNOSIS — K640 First degree hemorrhoids: Secondary | ICD-10-CM | POA: Diagnosis not present

## 2018-10-05 DIAGNOSIS — K2970 Gastritis, unspecified, without bleeding: Secondary | ICD-10-CM | POA: Diagnosis not present

## 2018-10-05 DIAGNOSIS — K56699 Other intestinal obstruction unspecified as to partial versus complete obstruction: Secondary | ICD-10-CM | POA: Diagnosis not present

## 2018-10-05 DIAGNOSIS — K210 Gastro-esophageal reflux disease with esophagitis: Secondary | ICD-10-CM | POA: Diagnosis not present

## 2018-10-05 DIAGNOSIS — R1310 Dysphagia, unspecified: Secondary | ICD-10-CM | POA: Diagnosis not present

## 2018-10-05 DIAGNOSIS — K635 Polyp of colon: Secondary | ICD-10-CM | POA: Diagnosis not present

## 2018-10-05 DIAGNOSIS — K591 Functional diarrhea: Secondary | ICD-10-CM | POA: Diagnosis not present

## 2018-10-05 DIAGNOSIS — K5730 Diverticulosis of large intestine without perforation or abscess without bleeding: Secondary | ICD-10-CM | POA: Diagnosis not present

## 2018-10-05 DIAGNOSIS — K219 Gastro-esophageal reflux disease without esophagitis: Secondary | ICD-10-CM | POA: Diagnosis not present

## 2018-10-05 DIAGNOSIS — K449 Diaphragmatic hernia without obstruction or gangrene: Secondary | ICD-10-CM | POA: Diagnosis not present

## 2018-10-11 DIAGNOSIS — E785 Hyperlipidemia, unspecified: Secondary | ICD-10-CM | POA: Diagnosis not present

## 2018-10-11 DIAGNOSIS — E890 Postprocedural hypothyroidism: Secondary | ICD-10-CM | POA: Diagnosis not present

## 2018-10-11 DIAGNOSIS — D631 Anemia in chronic kidney disease: Secondary | ICD-10-CM | POA: Diagnosis not present

## 2018-10-11 DIAGNOSIS — N189 Chronic kidney disease, unspecified: Secondary | ICD-10-CM | POA: Diagnosis not present

## 2018-10-11 DIAGNOSIS — M5136 Other intervertebral disc degeneration, lumbar region: Secondary | ICD-10-CM | POA: Diagnosis not present

## 2018-10-11 DIAGNOSIS — I129 Hypertensive chronic kidney disease with stage 1 through stage 4 chronic kidney disease, or unspecified chronic kidney disease: Secondary | ICD-10-CM | POA: Diagnosis not present

## 2018-10-11 DIAGNOSIS — E1122 Type 2 diabetes mellitus with diabetic chronic kidney disease: Secondary | ICD-10-CM | POA: Diagnosis not present

## 2018-10-16 ENCOUNTER — Other Ambulatory Visit: Payer: Self-pay

## 2018-10-16 ENCOUNTER — Encounter
Admission: RE | Admit: 2018-10-16 | Discharge: 2018-10-16 | Disposition: A | Discharge status: Home / Self Care | Payer: Medicare HMO | Source: Ambulatory Visit | Attending: Neurosurgery | Admitting: Neurosurgery

## 2018-10-16 DIAGNOSIS — R9431 Abnormal electrocardiogram [ECG] [EKG]: Secondary | ICD-10-CM | POA: Diagnosis not present

## 2018-10-16 DIAGNOSIS — I4510 Unspecified right bundle-branch block: Secondary | ICD-10-CM | POA: Diagnosis not present

## 2018-10-16 DIAGNOSIS — Z01818 Encounter for other preprocedural examination: Secondary | ICD-10-CM | POA: Diagnosis not present

## 2018-10-16 HISTORY — DX: Nontoxic multinodular goiter: E04.2

## 2018-10-16 HISTORY — DX: Barrett's esophagus without dysplasia: K22.70

## 2018-10-16 HISTORY — DX: Fatty (change of) liver, not elsewhere classified: K76.0

## 2018-10-16 HISTORY — DX: Cerebral infarction, unspecified: I63.9

## 2018-10-16 HISTORY — DX: Dyspnea, unspecified: R06.00

## 2018-10-16 HISTORY — DX: Cardiac arrhythmia, unspecified: I49.9

## 2018-10-16 HISTORY — DX: Esophageal obstruction: K22.2

## 2018-10-16 HISTORY — DX: Hyperlipidemia, unspecified: E78.5

## 2018-10-16 HISTORY — DX: Myoneural disorder, unspecified: G70.9

## 2018-10-16 HISTORY — DX: Anxiety disorder, unspecified: F41.9

## 2018-10-16 HISTORY — DX: Depression, unspecified: F32.A

## 2018-10-16 LAB — CBC
HCT: 33.7 % — ABNORMAL LOW (ref 36.0–46.0)
Hemoglobin: 10.9 g/dL — ABNORMAL LOW (ref 12.0–15.0)
MCH: 29.9 pg (ref 26.0–34.0)
MCHC: 32.3 g/dL (ref 30.0–36.0)
MCV: 92.6 fL (ref 80.0–100.0)
Platelets: 212 10*3/uL (ref 150–400)
RBC: 3.64 MIL/uL — ABNORMAL LOW (ref 3.87–5.11)
RDW: 14 % (ref 11.5–15.5)
WBC: 6.3 10*3/uL (ref 4.0–10.5)
nRBC: 0 % (ref 0.0–0.2)

## 2018-10-16 LAB — URINALYSIS, ROUTINE W REFLEX MICROSCOPIC
Bacteria, UA: NONE SEEN
Bilirubin Urine: NEGATIVE
Glucose, UA: NEGATIVE mg/dL
Hgb urine dipstick: NEGATIVE
Ketones, ur: NEGATIVE mg/dL
Nitrite: NEGATIVE
Protein, ur: NEGATIVE mg/dL
Specific Gravity, Urine: 1.015 (ref 1.005–1.030)
pH: 5 (ref 5.0–8.0)

## 2018-10-16 LAB — BASIC METABOLIC PANEL
Anion gap: 10 (ref 5–15)
BUN: 26 mg/dL — ABNORMAL HIGH (ref 8–23)
CO2: 25 mmol/L (ref 22–32)
Calcium: 9.5 mg/dL (ref 8.9–10.3)
Chloride: 104 mmol/L (ref 98–111)
Creatinine, Ser: 1.24 mg/dL — ABNORMAL HIGH (ref 0.44–1.00)
GFR calc Af Amer: 50 mL/min — ABNORMAL LOW (ref >60)
GFR calc non Af Amer: 43 mL/min — ABNORMAL LOW (ref >60)
Glucose, Bld: 122 mg/dL — ABNORMAL HIGH (ref 70–99)
Potassium: 4.3 mmol/L (ref 3.5–5.1)
Sodium: 139 mmol/L (ref 135–145)

## 2018-10-16 LAB — TYPE AND SCREEN
ABO/RH(D): A POS
Antibody Screen: NEGATIVE

## 2018-10-16 LAB — PROTIME-INR
INR: 1 (ref 0.8–1.2)
Prothrombin Time: 13.2 seconds (ref 11.4–15.2)

## 2018-10-16 LAB — SURGICAL PCR SCREEN
MRSA, PCR: NEGATIVE
Staphylococcus aureus: NEGATIVE

## 2018-10-16 LAB — APTT: aPTT: 28 seconds (ref 24–36)

## 2018-10-16 NOTE — Patient Instructions (Signed)
INSTRUCTIONS FOR SURGERY     Your surgery is scheduled for:   Wednesday, October 25, 2018     To find out your arrival time for the day of surgery,          please call [phone] between 1 pm and 3 pm on :   Tuesday, October 24, 2018     When you arrive for surgery, report to the [facility].       Do NOT stop on the first floor to register.    REMEMBER: Instructions that are not followed completely may result in serious medical risk,  up to and including death, or upon the discretion of your surgeon and anesthesiologist,            your surgery may need to be rescheduled.  __X__ 1. Do not eat food after midnight the night before your procedure.                    No gum, candy, lozenger, tic tacs, tums or hard candies.                  ABSOLUTELY NOTHING SOLID IN YOUR MOUTH AFTER MIDNIGHT                    You may drink unlimited clear liquids up to 2 hours before you are scheduled to arrive for surgery.                   Do not drink anything within those 2 hours unless you need to take medicine, then take the                   smallest amount you need.  Clear liquids include:  water, apple juice without pulp,                   any flavor Gatorade, Black coffee, black tea.  Sugar may be added but no dairy/ honey /lemon.                        Broth and jello is not considered a clear liquid.  __x__  2. On the morning of surgery, please brush your teeth with toothpaste and water. You may rinse with                  mouthwash if you wish but DO NOT SWALLOW TOOTHPASTE OR MOUTHWASH  __X___3. NO alcohol for 24 hours before or after surgery.  __x___ 4.  Do NOT smoke or use e-cigarettes for 24 HOURS PRIOR TO SURGERY.                      DO NOT Use any chewable tobacco products for at least 6 hours prior to surgery.  __x___ 5. If you start any new medication after this appointment and prior to surgery,  please                   Bring it with you on the day of surgery.  ___x__ 6. Notify your doctor if there is  any change in your medical condition, such as fever, infection, vomitting,                   Diarrhea or any open sores.  __x___ 7.  USE the CHG SOAP as instructed, the night before surgery and the day of surgery.                   Once you have washed with this soap, do NOT use any of the following: Powders, perfumes                    or lotions. Please do not wear make up, hairpins, clips or nail polish. You MAY wear deodorant.                   Men may shave their face and neck.  Women need to shave 48 hours prior to surgery.                   DO NOT wear ANY jewelry on the day of surgery. If there are rings that are too tight to                    remove easily, please address this prior to the surgery day. Piercings need to be removed.                                                                     NO METAL ON YOUR BODY.                    Do NOT bring any valuables.  If you came to Pre-Admit testing then you will not need license,                     insurance card or credit card.  If you will be staying overnight, please either leave your things in                     the car or have your family be responsible for these items.                     [facility] IS NOT RESPONSIBLE FOR BELONGINGS OR VALUABLES.  ___X__ 8. DO NOT wear contact lenses on surgery day.  You may not have dentures,                     Hearing aides, contacts or glasses in the operating room. These items can be                    Placed in the Recovery Room to receive immediately after surgery.  __x___ 9. IF YOU ARE SCHEDULED TO GO HOME ON THE SAME DAY, YOU MUST                   Have someone to drive you home and to stay with you  for the first 24 hours.                    Have an arrangement prior to arriving on surgery day.  ___x__ 10. Take the  following medications on the morning of surgery with a  sip of water:                              1. synthroid                     2. cytomel                     3. metoprolol                     4. protonix (take an extra dose the night before surgery)                     5. effexor                     6.  _____ 11.  Follow any instructions provided to you by your surgeon.                        Such as enema, clear liquid bowel prep  __X__  12. STOP  ASPIRIN AS OF:  October 18, 2018                       THIS INCLUDES BC POWDERS / GOODIES POWDER  __x___ 13. STOP Anti-inflammatories as of: October 18, 2018                      This includes IBUPROFEN / MOTRIN / ADVIL / ALEVE/ NAPROXYN                    YOU MAY TAKE TYLENOL ANY TIME PRIOR TO SURGERY.  _____ 14.  Stop supplements until after surgery.                     This includes:  BIOTIN / LUTEIN                 You may continue taking Vitamin B12 / Vitamin D3 but do not take on the morning of surgery.  _____ 15. Bring your CPAP machine into preop with you on the morning of surgery.  ___X___16.  Stop Metformin 2 full days prior to surgery.  Stop on:  THE EVENING OF 10/22/18                     TAKE 1/2 OF USUAL INSULIN DOSE ON THE EVENING PRIOR TO SURGERY.                     Do NOT take any diabetes medications on surgery day.  ___X___17.  Continue to take the following medications but do not take on the morning of surgery:                            VASOTEC /GLIMIPERIDE / HCTZ / ACTOS                     CONTINUE TAKING ALL EVENING MEDICATIONS  __X____18. If staying overnight, please have appropriate shoes to wear to be able to walk around the unit.                   Wear clean and comfortable clothing to the  hospital.  Margretta Sidle PHONE AND CHARGER  BRING MEDICAL ADVANCE DIRECTIVES SO WE CAN MAKE A COPY FOR YOUR CHART  BRING CONTACT TO PUT IN AFTER SURGERY

## 2018-10-20 ENCOUNTER — Other Ambulatory Visit
Admission: RE | Admit: 2018-10-20 | Discharge: 2018-10-20 | Disposition: A | Discharge status: Home / Self Care | Payer: Medicare HMO | Source: Ambulatory Visit | Attending: Neurosurgery | Admitting: Neurosurgery

## 2018-10-20 ENCOUNTER — Other Ambulatory Visit: Payer: Self-pay

## 2018-10-20 DIAGNOSIS — Z01812 Encounter for preprocedural laboratory examination: Secondary | ICD-10-CM | POA: Insufficient documentation

## 2018-10-20 DIAGNOSIS — Z20828 Contact with and (suspected) exposure to other viral communicable diseases: Secondary | ICD-10-CM | POA: Insufficient documentation

## 2018-10-20 LAB — SARS CORONAVIRUS 2 (TAT 6-24 HRS): SARS Coronavirus 2: NEGATIVE

## 2018-10-23 DIAGNOSIS — R1312 Dysphagia, oropharyngeal phase: Secondary | ICD-10-CM | POA: Diagnosis not present

## 2018-10-23 DIAGNOSIS — K2270 Barrett's esophagus without dysplasia: Secondary | ICD-10-CM | POA: Diagnosis not present

## 2018-10-23 DIAGNOSIS — K529 Noninfective gastroenteritis and colitis, unspecified: Secondary | ICD-10-CM | POA: Diagnosis not present

## 2018-10-23 DIAGNOSIS — K760 Fatty (change of) liver, not elsewhere classified: Secondary | ICD-10-CM | POA: Diagnosis not present

## 2018-10-25 ENCOUNTER — Ambulatory Visit: Payer: Medicare HMO

## 2018-10-25 ENCOUNTER — Ambulatory Visit: Payer: Medicare HMO | Admitting: Certified Registered"

## 2018-10-25 ENCOUNTER — Other Ambulatory Visit: Payer: Self-pay

## 2018-10-25 ENCOUNTER — Observation Stay
Admission: RE | Admit: 2018-10-25 | Discharge: 2018-10-26 | Disposition: A | Discharge status: Home / Self Care | Payer: Medicare HMO | Attending: Neurosurgery | Admitting: Neurosurgery

## 2018-10-25 ENCOUNTER — Encounter: Payer: Self-pay | Admitting: *Deleted

## 2018-10-25 ENCOUNTER — Encounter
Admission: RE | Disposition: A | Discharge status: Home / Self Care | Payer: Self-pay | Source: Home / Self Care | Attending: Neurosurgery

## 2018-10-25 DIAGNOSIS — Z7982 Long term (current) use of aspirin: Secondary | ICD-10-CM | POA: Diagnosis not present

## 2018-10-25 DIAGNOSIS — Z6835 Body mass index (BMI) 35.0-35.9, adult: Secondary | ICD-10-CM | POA: Diagnosis not present

## 2018-10-25 DIAGNOSIS — Z882 Allergy status to sulfonamides status: Secondary | ICD-10-CM | POA: Insufficient documentation

## 2018-10-25 DIAGNOSIS — Z7984 Long term (current) use of oral hypoglycemic drugs: Secondary | ICD-10-CM | POA: Diagnosis not present

## 2018-10-25 DIAGNOSIS — M48062 Spinal stenosis, lumbar region with neurogenic claudication: Principal | ICD-10-CM | POA: Insufficient documentation

## 2018-10-25 DIAGNOSIS — K219 Gastro-esophageal reflux disease without esophagitis: Secondary | ICD-10-CM | POA: Insufficient documentation

## 2018-10-25 DIAGNOSIS — I129 Hypertensive chronic kidney disease with stage 1 through stage 4 chronic kidney disease, or unspecified chronic kidney disease: Secondary | ICD-10-CM | POA: Insufficient documentation

## 2018-10-25 DIAGNOSIS — Z87442 Personal history of urinary calculi: Secondary | ICD-10-CM | POA: Insufficient documentation

## 2018-10-25 DIAGNOSIS — M5416 Radiculopathy, lumbar region: Secondary | ICD-10-CM | POA: Diagnosis not present

## 2018-10-25 DIAGNOSIS — M4310 Spondylolisthesis, site unspecified: Secondary | ICD-10-CM | POA: Diagnosis not present

## 2018-10-25 DIAGNOSIS — M4316 Spondylolisthesis, lumbar region: Secondary | ICD-10-CM | POA: Diagnosis not present

## 2018-10-25 DIAGNOSIS — Z8673 Personal history of transient ischemic attack (TIA), and cerebral infarction without residual deficits: Secondary | ICD-10-CM | POA: Insufficient documentation

## 2018-10-25 DIAGNOSIS — Z885 Allergy status to narcotic agent status: Secondary | ICD-10-CM | POA: Diagnosis not present

## 2018-10-25 DIAGNOSIS — E1122 Type 2 diabetes mellitus with diabetic chronic kidney disease: Secondary | ICD-10-CM | POA: Insufficient documentation

## 2018-10-25 DIAGNOSIS — E669 Obesity, unspecified: Secondary | ICD-10-CM | POA: Diagnosis not present

## 2018-10-25 DIAGNOSIS — N183 Chronic kidney disease, stage 3 (moderate): Secondary | ICD-10-CM | POA: Insufficient documentation

## 2018-10-25 DIAGNOSIS — E785 Hyperlipidemia, unspecified: Secondary | ICD-10-CM | POA: Diagnosis not present

## 2018-10-25 DIAGNOSIS — E039 Hypothyroidism, unspecified: Secondary | ICD-10-CM | POA: Insufficient documentation

## 2018-10-25 DIAGNOSIS — Z419 Encounter for procedure for purposes other than remedying health state, unspecified: Secondary | ICD-10-CM

## 2018-10-25 DIAGNOSIS — M1990 Unspecified osteoarthritis, unspecified site: Secondary | ICD-10-CM | POA: Insufficient documentation

## 2018-10-25 DIAGNOSIS — Z79890 Hormone replacement therapy (postmenopausal): Secondary | ICD-10-CM | POA: Diagnosis not present

## 2018-10-25 DIAGNOSIS — Z9889 Other specified postprocedural states: Secondary | ICD-10-CM

## 2018-10-25 DIAGNOSIS — Z981 Arthrodesis status: Secondary | ICD-10-CM | POA: Diagnosis not present

## 2018-10-25 DIAGNOSIS — Z79899 Other long term (current) drug therapy: Secondary | ICD-10-CM | POA: Diagnosis not present

## 2018-10-25 HISTORY — PX: LUMBAR LAMINECTOMY/DECOMPRESSION MICRODISCECTOMY: SHX5026

## 2018-10-25 LAB — GLUCOSE, CAPILLARY
Glucose-Capillary: 155 mg/dL — ABNORMAL HIGH (ref 70–99)
Glucose-Capillary: 168 mg/dL — ABNORMAL HIGH (ref 70–99)
Glucose-Capillary: 136 mg/dL — ABNORMAL HIGH (ref 70–99)
Glucose-Capillary: 70 mg/dL (ref 70–99)

## 2018-10-25 LAB — ABO/RH: ABO/RH(D): A POS

## 2018-10-25 SURGERY — LUMBAR LAMINECTOMY/DECOMPRESSION MICRODISCECTOMY 2 LEVELS
Anesthesia: General

## 2018-10-25 MED ORDER — ROCURONIUM BROMIDE 100 MG/10ML IV SOLN
INTRAVENOUS | Status: DC | PRN
  Administered 2018-10-25: 5 mg via INTRAVENOUS
  Administered 2018-10-25: 20 mg via INTRAVENOUS

## 2018-10-25 MED ORDER — SODIUM CHLORIDE 0.9 % IR SOLN
Status: DC | PRN
  Administered 2018-10-25: 08:00:00 550 mL

## 2018-10-25 MED ORDER — GLIMEPIRIDE 4 MG PO TABS
4.0000 mg | ORAL_TABLET | Freq: Every day | ORAL | Status: DC
  Administered 2018-10-26: 09:00:00 4 mg via ORAL
  Filled 2018-10-25: qty 1

## 2018-10-25 MED ORDER — ROSUVASTATIN CALCIUM 5 MG PO TABS
10.0000 mg | ORAL_TABLET | Freq: Every day | ORAL | Status: DC
  Administered 2018-10-25: 20:00:00 10 mg via ORAL
  Filled 2018-10-25: qty 2

## 2018-10-25 MED ORDER — ACETAMINOPHEN 650 MG RE SUPP
650.0000 mg | RECTAL | Status: DC | PRN
  Filled 2018-10-25: qty 1

## 2018-10-25 MED ORDER — SENNA 8.6 MG PO TABS
1.0000 | ORAL_TABLET | Freq: Two times a day (BID) | ORAL | Status: DC
  Administered 2018-10-25 – 2018-10-26 (×2): 8.6 mg via ORAL
  Filled 2018-10-25 (×2): qty 1

## 2018-10-25 MED ORDER — ONDANSETRON HCL 4 MG PO TABS: 4.0000 mg | ORAL_TABLET | Freq: Four times a day (QID) | ORAL | Status: DC | PRN

## 2018-10-25 MED ORDER — DEXAMETHASONE SODIUM PHOSPHATE 10 MG/ML IJ SOLN
INTRAMUSCULAR | Status: DC | PRN
  Administered 2018-10-25: 10 mg via INTRAVENOUS

## 2018-10-25 MED ORDER — ACETAMINOPHEN 500 MG PO TABS
1000.0000 mg | ORAL_TABLET | Freq: Four times a day (QID) | ORAL | Status: AC
  Administered 2018-10-25 – 2018-10-26 (×2): 1000 mg via ORAL
  Filled 2018-10-25 (×4): qty 2

## 2018-10-25 MED ORDER — LIDOCAINE HCL (PF) 2 % IJ SOLN
INTRAMUSCULAR | Status: AC
  Filled 2018-10-25: qty 10

## 2018-10-25 MED ORDER — OXYCODONE HCL 5 MG PO TABS
5.0000 mg | ORAL_TABLET | ORAL | Status: DC | PRN
  Administered 2018-10-25: 16:00:00 5 mg via ORAL
  Filled 2018-10-25: qty 1

## 2018-10-25 MED ORDER — SODIUM CHLORIDE 0.9 % IV SOLN
INTRAVENOUS | Status: DC
  Administered 2018-10-25: 07:00:00 via INTRAVENOUS

## 2018-10-25 MED ORDER — EVICEL 2 ML EX KIT
PACK | CUTANEOUS | Status: AC
  Filled 2018-10-25: qty 1

## 2018-10-25 MED ORDER — THROMBIN 5000 UNITS EX SOLR
CUTANEOUS | Status: AC
  Filled 2018-10-25: qty 5000

## 2018-10-25 MED ORDER — LEVOTHYROXINE SODIUM 25 MCG PO TABS
125.0000 ug | ORAL_TABLET | Freq: Every day | ORAL | Status: DC
  Administered 2018-10-26: 05:00:00 125 ug via ORAL
  Filled 2018-10-25: qty 1

## 2018-10-25 MED ORDER — POLYETHYLENE GLYCOL 3350 17 G PO PACK: 17.0000 g | PACK | Freq: Every day | ORAL | Status: DC | PRN

## 2018-10-25 MED ORDER — EVICEL 2 ML EX KIT
PACK | CUTANEOUS | Status: DC | PRN
  Administered 2018-10-25: 2 mL

## 2018-10-25 MED ORDER — METHYLPREDNISOLONE ACETATE 40 MG/ML IJ SUSP
INTRAMUSCULAR | Status: AC
  Filled 2018-10-25: qty 1

## 2018-10-25 MED ORDER — METHOCARBAMOL 1000 MG/10ML IJ SOLN
500.0000 mg | Freq: Four times a day (QID) | INTRAVENOUS | Status: DC
  Filled 2018-10-25: qty 5

## 2018-10-25 MED ORDER — MIDAZOLAM HCL 2 MG/2ML IJ SOLN
INTRAMUSCULAR | Status: AC
  Filled 2018-10-25: qty 2

## 2018-10-25 MED ORDER — BUPIVACAINE HCL (PF) 0.5 % IJ SOLN
INTRAMUSCULAR | Status: DC | PRN
  Administered 2018-10-25: 20 mL

## 2018-10-25 MED ORDER — PROMETHAZINE HCL 25 MG/ML IJ SOLN
6.2500 mg | INTRAMUSCULAR | Status: DC | PRN
  Administered 2018-10-25: 13:00:00 6.25 mg via INTRAVENOUS

## 2018-10-25 MED ORDER — BACITRACIN 50000 UNITS IM SOLR
INTRAMUSCULAR | Status: AC
  Filled 2018-10-25: qty 1

## 2018-10-25 MED ORDER — BUPIVACAINE-EPINEPHRINE (PF) 0.5% -1:200000 IJ SOLN
INTRAMUSCULAR | Status: AC
  Filled 2018-10-25: qty 30

## 2018-10-25 MED ORDER — THROMBIN 5000 UNITS EX SOLR
CUTANEOUS | Status: DC | PRN
  Administered 2018-10-25: 5000 [IU] via TOPICAL

## 2018-10-25 MED ORDER — ACETAMINOPHEN 325 MG PO TABS
650.0000 mg | ORAL_TABLET | ORAL | Status: DC | PRN
  Administered 2018-10-25 – 2018-10-26 (×2): 650 mg via ORAL
  Filled 2018-10-25 (×2): qty 2

## 2018-10-25 MED ORDER — CEFAZOLIN SODIUM-DEXTROSE 2-4 GM/100ML-% IV SOLN
INTRAVENOUS | Status: AC
  Filled 2018-10-25: qty 100

## 2018-10-25 MED ORDER — LIOTHYRONINE SODIUM 5 MCG PO TABS
5.0000 ug | ORAL_TABLET | Freq: Every day | ORAL | Status: DC
  Administered 2018-10-26: 09:00:00 5 ug via ORAL
  Filled 2018-10-25 (×2): qty 1

## 2018-10-25 MED ORDER — LIDOCAINE HCL (CARDIAC) PF 100 MG/5ML IV SOSY
PREFILLED_SYRINGE | INTRAVENOUS | Status: DC | PRN
  Administered 2018-10-25: 50 mg via INTRAVENOUS

## 2018-10-25 MED ORDER — MIDAZOLAM HCL 2 MG/2ML IJ SOLN
INTRAMUSCULAR | Status: DC | PRN
  Administered 2018-10-25: 2 mg via INTRAVENOUS

## 2018-10-25 MED ORDER — PROPOFOL 10 MG/ML IV BOLUS
INTRAVENOUS | Status: AC
  Filled 2018-10-25: qty 20

## 2018-10-25 MED ORDER — ONDANSETRON HCL 4 MG/2ML IJ SOLN
4.0000 mg | Freq: Four times a day (QID) | INTRAMUSCULAR | Status: DC | PRN
  Administered 2018-10-25: 18:00:00 4 mg via INTRAVENOUS
  Filled 2018-10-25: qty 2

## 2018-10-25 MED ORDER — SODIUM CHLORIDE 0.9 % IV SOLN
INTRAVENOUS | Status: DC | PRN
  Administered 2018-10-25: 08:00:00 30 ug/min via INTRAVENOUS

## 2018-10-25 MED ORDER — OXYCODONE HCL 5 MG PO TABS: 10.0000 mg | ORAL_TABLET | ORAL | Status: DC | PRN

## 2018-10-25 MED ORDER — HYDROCHLOROTHIAZIDE 25 MG PO TABS
12.5000 mg | ORAL_TABLET | Freq: Every day | ORAL | Status: DC
  Filled 2018-10-25: qty 1

## 2018-10-25 MED ORDER — SODIUM CHLORIDE FLUSH 0.9 % IV SOLN
INTRAVENOUS | Status: AC
  Filled 2018-10-25: qty 10

## 2018-10-25 MED ORDER — REMIFENTANIL HCL 1 MG IV SOLR
INTRAVENOUS | Status: AC
  Filled 2018-10-25: qty 1000

## 2018-10-25 MED ORDER — PROMETHAZINE HCL 25 MG/ML IJ SOLN
INTRAMUSCULAR | Status: AC
  Administered 2018-10-25: 6.25 mg via INTRAVENOUS
  Filled 2018-10-25: qty 1

## 2018-10-25 MED ORDER — OXYCODONE HCL 5 MG PO TABS: 5.0000 mg | ORAL_TABLET | Freq: Once | ORAL | Status: DC | PRN

## 2018-10-25 MED ORDER — SODIUM CHLORIDE 0.9 % IV SOLN
INTRAVENOUS | Status: DC
  Administered 2018-10-25 – 2018-10-26 (×2): via INTRAVENOUS

## 2018-10-25 MED ORDER — HYDROMORPHONE HCL 1 MG/ML IJ SOLN: 0.5000 mg | INTRAMUSCULAR | Status: DC | PRN

## 2018-10-25 MED ORDER — OXYCODONE HCL 5 MG PO TABS
5.0000 mg | ORAL_TABLET | ORAL | Status: DC | PRN
  Administered 2018-10-25 – 2018-10-26 (×2): 5 mg via ORAL
  Filled 2018-10-25 (×3): qty 1

## 2018-10-25 MED ORDER — PHENYLEPHRINE HCL (PRESSORS) 10 MG/ML IV SOLN
INTRAVENOUS | Status: AC
  Filled 2018-10-25: qty 1

## 2018-10-25 MED ORDER — INSULIN ASPART 100 UNIT/ML ~~LOC~~ SOLN: 0.0000 [IU] | Freq: Three times a day (TID) | SUBCUTANEOUS | Status: DC

## 2018-10-25 MED ORDER — MAGNESIUM CITRATE PO SOLN
1.0000 | Freq: Once | ORAL | Status: DC | PRN
  Filled 2018-10-25: qty 296

## 2018-10-25 MED ORDER — PIOGLITAZONE HCL 30 MG PO TABS
45.0000 mg | ORAL_TABLET | Freq: Every day | ORAL | Status: DC
  Administered 2018-10-26: 45 mg via ORAL
  Filled 2018-10-25: qty 1

## 2018-10-25 MED ORDER — ONDANSETRON HCL 4 MG/2ML IJ SOLN
INTRAMUSCULAR | Status: AC
  Filled 2018-10-25: qty 2

## 2018-10-25 MED ORDER — CYCLOSPORINE 0.05 % OP EMUL
1.0000 [drp] | Freq: Two times a day (BID) | OPHTHALMIC | Status: DC
  Filled 2018-10-25 (×3): qty 30

## 2018-10-25 MED ORDER — FENTANYL CITRATE (PF) 100 MCG/2ML IJ SOLN
INTRAMUSCULAR | Status: AC
  Filled 2018-10-25: qty 2

## 2018-10-25 MED ORDER — BUPIVACAINE LIPOSOME 1.3 % IJ SUSP
INTRAMUSCULAR | Status: AC
  Filled 2018-10-25: qty 20

## 2018-10-25 MED ORDER — SUCCINYLCHOLINE CHLORIDE 20 MG/ML IJ SOLN
INTRAMUSCULAR | Status: AC
Start: 2018-10-25 — End: ?
  Filled 2018-10-25: qty 1

## 2018-10-25 MED ORDER — METOPROLOL SUCCINATE ER 50 MG PO TB24
100.0000 mg | ORAL_TABLET | Freq: Every day | ORAL | Status: DC
  Filled 2018-10-25: qty 2

## 2018-10-25 MED ORDER — OXYCODONE HCL 5 MG/5ML PO SOLN: 5.0000 mg | Freq: Once | ORAL | Status: DC | PRN

## 2018-10-25 MED ORDER — SODIUM CHLORIDE 0.9 % IV SOLN
INTRAVENOUS | Status: DC | PRN
  Administered 2018-10-25: 11:00:00 40 mL

## 2018-10-25 MED ORDER — KETAMINE HCL 50 MG/ML IJ SOLN
INTRAMUSCULAR | Status: DC | PRN
  Administered 2018-10-25: 25 mg via INTRAVENOUS
  Administered 2018-10-25 (×2): 25 mg via INTRAMUSCULAR

## 2018-10-25 MED ORDER — SODIUM CHLORIDE 0.9 % IV SOLN: 250.0000 mL | INTRAVENOUS | Status: DC

## 2018-10-25 MED ORDER — FENTANYL CITRATE (PF) 100 MCG/2ML IJ SOLN: 25.0000 ug | INTRAMUSCULAR | Status: DC | PRN

## 2018-10-25 MED ORDER — ENALAPRIL MALEATE 5 MG PO TABS
5.0000 mg | ORAL_TABLET | Freq: Two times a day (BID) | ORAL | Status: DC
  Filled 2018-10-25 (×3): qty 1

## 2018-10-25 MED ORDER — METHOCARBAMOL 500 MG PO TABS
500.0000 mg | ORAL_TABLET | Freq: Four times a day (QID) | ORAL | Status: DC
  Administered 2018-10-25 – 2018-10-26 (×2): 500 mg via ORAL
  Filled 2018-10-25 (×3): qty 1

## 2018-10-25 MED ORDER — FENTANYL CITRATE (PF) 100 MCG/2ML IJ SOLN
INTRAMUSCULAR | Status: DC | PRN
  Administered 2018-10-25: 50 ug via INTRAVENOUS
  Administered 2018-10-25: 100 ug via INTRAVENOUS
  Administered 2018-10-25: 50 ug via INTRAVENOUS

## 2018-10-25 MED ORDER — SODIUM CHLORIDE FLUSH 0.9 % IV SOLN
INTRAVENOUS | Status: AC
  Filled 2018-10-25: qty 40

## 2018-10-25 MED ORDER — TRAMADOL HCL 50 MG PO TABS: 50.0000 mg | ORAL_TABLET | Freq: Four times a day (QID) | ORAL | Status: DC | PRN

## 2018-10-25 MED ORDER — DEXAMETHASONE SODIUM PHOSPHATE 10 MG/ML IJ SOLN
INTRAMUSCULAR | Status: AC
  Filled 2018-10-25: qty 1

## 2018-10-25 MED ORDER — SUCCINYLCHOLINE CHLORIDE 20 MG/ML IJ SOLN
INTRAMUSCULAR | Status: DC | PRN
  Administered 2018-10-25: 100 mg via INTRAVENOUS

## 2018-10-25 MED ORDER — PHENYLEPHRINE HCL (PRESSORS) 10 MG/ML IV SOLN
INTRAVENOUS | Status: DC | PRN
  Administered 2018-10-25: 100 ug via INTRAVENOUS

## 2018-10-25 MED ORDER — SODIUM CHLORIDE 0.9% FLUSH
INTRAVENOUS | Status: DC | PRN
  Administered 2018-10-25: 10 mL

## 2018-10-25 MED ORDER — PANTOPRAZOLE SODIUM 40 MG PO TBEC
40.0000 mg | DELAYED_RELEASE_TABLET | Freq: Every day | ORAL | Status: DC
  Administered 2018-10-26: 40 mg via ORAL
  Filled 2018-10-25: qty 1

## 2018-10-25 MED ORDER — METFORMIN HCL 500 MG PO TABS
1000.0000 mg | ORAL_TABLET | Freq: Two times a day (BID) | ORAL | Status: DC
  Filled 2018-10-25 (×2): qty 2

## 2018-10-25 MED ORDER — KETAMINE HCL 50 MG/ML IJ SOLN
INTRAMUSCULAR | Status: AC
  Filled 2018-10-25: qty 10

## 2018-10-25 MED ORDER — BACITRACIN-NEOMYCIN-POLYMYXIN OINTMENT TUBE
TOPICAL_OINTMENT | Freq: Three times a day (TID) | CUTANEOUS | Status: DC
  Administered 2018-10-25 – 2018-10-26 (×2): via TOPICAL
  Filled 2018-10-25 (×2): qty 14.17

## 2018-10-25 MED ORDER — BISACODYL 5 MG PO TBEC: 5.0000 mg | DELAYED_RELEASE_TABLET | Freq: Every day | ORAL | Status: DC | PRN

## 2018-10-25 MED ORDER — VENLAFAXINE HCL ER 37.5 MG PO CP24
37.5000 mg | ORAL_CAPSULE | Freq: Every day | ORAL | Status: DC
  Administered 2018-10-26: 09:00:00 37.5 mg via ORAL
  Filled 2018-10-25: qty 1

## 2018-10-25 MED ORDER — SODIUM CHLORIDE 0.9% FLUSH
3.0000 mL | Freq: Two times a day (BID) | INTRAVENOUS | Status: DC
  Administered 2018-10-26: 3 mL via INTRAVENOUS

## 2018-10-25 MED ORDER — CEFAZOLIN SODIUM 1 G IJ SOLR
INTRAMUSCULAR | Status: AC
  Filled 2018-10-25: qty 20

## 2018-10-25 MED ORDER — BUPIVACAINE-EPINEPHRINE (PF) 0.5% -1:200000 IJ SOLN
INTRAMUSCULAR | Status: DC | PRN
  Administered 2018-10-25: 10 mL via PERINEURAL

## 2018-10-25 MED ORDER — SODIUM CHLORIDE (PF) 0.9 % IJ SOLN
INTRAMUSCULAR | Status: AC
  Filled 2018-10-25: qty 40

## 2018-10-25 MED ORDER — SODIUM CHLORIDE 0.9% FLUSH: 3.0000 mL | INTRAVENOUS | Status: DC | PRN

## 2018-10-25 MED ORDER — GLYCOPYRROLATE 0.2 MG/ML IJ SOLN
INTRAMUSCULAR | Status: DC | PRN
  Administered 2018-10-25: 0.2 mg via INTRAVENOUS

## 2018-10-25 MED ORDER — PHENOL 1.4 % MT LIQD
1.0000 | OROMUCOSAL | Status: DC | PRN
  Filled 2018-10-25: qty 177

## 2018-10-25 MED ORDER — METHYLPREDNISOLONE ACETATE 40 MG/ML IJ SUSP
INTRAMUSCULAR | Status: DC | PRN
  Administered 2018-10-25: 40 mg

## 2018-10-25 MED ORDER — REMIFENTANIL HCL 1 MG IV SOLR
INTRAVENOUS | Status: DC | PRN
  Administered 2018-10-25: .1 ug/kg/min via INTRAVENOUS

## 2018-10-25 MED ORDER — MENTHOL 3 MG MT LOZG
1.0000 | LOZENGE | OROMUCOSAL | Status: DC | PRN
  Filled 2018-10-25: qty 9

## 2018-10-25 MED ORDER — CEFAZOLIN SODIUM-DEXTROSE 2-4 GM/100ML-% IV SOLN
2.0000 g | INTRAVENOUS | Status: AC
  Administered 2018-10-25 (×2): 2 g via INTRAVENOUS

## 2018-10-25 MED ORDER — ONDANSETRON HCL 4 MG/2ML IJ SOLN
INTRAMUSCULAR | Status: DC | PRN
  Administered 2018-10-25 (×2): 4 mg via INTRAVENOUS

## 2018-10-25 MED ORDER — GLYCOPYRROLATE 0.2 MG/ML IJ SOLN
INTRAMUSCULAR | Status: AC
  Filled 2018-10-25: qty 1

## 2018-10-25 MED ORDER — BUPIVACAINE HCL (PF) 0.5 % IJ SOLN
INTRAMUSCULAR | Status: AC
  Filled 2018-10-25: qty 30

## 2018-10-25 MED ORDER — PROPOFOL 10 MG/ML IV BOLUS
INTRAVENOUS | Status: DC | PRN
  Administered 2018-10-25: 50 mg via INTRAVENOUS
  Administered 2018-10-25: 150 mg via INTRAVENOUS

## 2018-10-25 MED ORDER — ROCURONIUM BROMIDE 50 MG/5ML IV SOLN
INTRAVENOUS | Status: AC
  Filled 2018-10-25: qty 1

## 2018-10-25 MED ORDER — FENOFIBRATE 160 MG PO TABS
160.0000 mg | ORAL_TABLET | Freq: Every day | ORAL | Status: DC
  Administered 2018-10-25: 20:00:00 160 mg via ORAL
  Filled 2018-10-25 (×2): qty 1

## 2018-10-25 SURGICAL SUPPLY — 66 items
BUR NEURO DRILL SOFT 3.0X3.8M (BURR) ×2 IMPLANT
CANISTER SUCT 1200ML W/VALVE (MISCELLANEOUS) ×4 IMPLANT
CHLORAPREP W/TINT 26 (MISCELLANEOUS) ×4 IMPLANT
CNTNR SPEC 2.5X3XGRAD LEK (MISCELLANEOUS) ×1
CONT SPEC 4OZ STER OR WHT (MISCELLANEOUS) ×1
CONTAINER SPEC 2.5X3XGRAD LEK (MISCELLANEOUS) ×1 IMPLANT
COUNTER NEEDLE 20/40 LG (NEEDLE) ×2 IMPLANT
COVER LIGHT HANDLE STERIS (MISCELLANEOUS) ×4 IMPLANT
COVER WAND RF STERILE (DRAPES) ×2 IMPLANT
CUP MEDICINE 2OZ PLAST GRAD ST (MISCELLANEOUS) ×2 IMPLANT
DERMABOND ADVANCED (GAUZE/BANDAGES/DRESSINGS) ×1
DERMABOND ADVANCED .7 DNX12 (GAUZE/BANDAGES/DRESSINGS) ×1 IMPLANT
DRAPE C-ARM 42X72 X-RAY (DRAPES) ×4 IMPLANT
DRAPE LAPAROTOMY 100X77 ABD (DRAPES) ×2 IMPLANT
DRAPE MICROSCOPE SPINE 48X150 (DRAPES) ×2 IMPLANT
DRAPE POUCH INSTRU U-SHP 10X18 (DRAPES) ×2 IMPLANT
DRAPE SURG 17X11 SM STRL (DRAPES) ×8 IMPLANT
DRSG TEGADERM 6X8 (GAUZE/BANDAGES/DRESSINGS) ×2 IMPLANT
DRSG TELFA 3X8 NADH (GAUZE/BANDAGES/DRESSINGS) ×4 IMPLANT
ELECT CAUTERY BLADE TIP 2.5 (TIP) ×2
ELECT EZSTD 165MM 6.5IN (MISCELLANEOUS) ×2
ELECT REM PT RETURN 9FT ADLT (ELECTROSURGICAL) ×2
ELECTRODE CAUTERY BLDE TIP 2.5 (TIP) ×1 IMPLANT
ELECTRODE EZSTD 165MM 6.5IN (MISCELLANEOUS) ×1 IMPLANT
ELECTRODE REM PT RTRN 9FT ADLT (ELECTROSURGICAL) ×1 IMPLANT
FRAME EYE SHIELD (PROTECTIVE WEAR) ×4 IMPLANT
GLOVE BIOGEL PI IND STRL 7.0 (GLOVE) ×1 IMPLANT
GLOVE BIOGEL PI IND STRL 7.5 (GLOVE) ×6 IMPLANT
GLOVE BIOGEL PI INDICATOR 7.0 (GLOVE) ×1
GLOVE BIOGEL PI INDICATOR 7.5 (GLOVE) ×6
GLOVE SURG SYN 7.0 (GLOVE) ×4 IMPLANT
GLOVE SURG SYN 8.5  E (GLOVE) ×3
GLOVE SURG SYN 8.5 E (GLOVE) ×3 IMPLANT
GOWN SRG XL LVL 3 NONREINFORCE (GOWNS) ×1 IMPLANT
GOWN STRL NON-REIN TWL XL LVL3 (GOWNS) ×1
GOWN STRL REUS W/TWL MED LVL3 (GOWN DISPOSABLE) ×10 IMPLANT
GRADUATE 1200CC STRL 31836 (MISCELLANEOUS) ×2 IMPLANT
GRAFT DURAGEN MATRIX 1WX1L (Tissue) ×2 IMPLANT
HOLDER FOLEY CATH W/STRAP (MISCELLANEOUS) ×2 IMPLANT
KIT SPINAL PRONEVIEW (KITS) ×2 IMPLANT
KNIFE BAYONET SHORT DISCETOMY (MISCELLANEOUS) IMPLANT
MARKER SKIN DUAL TIP RULER LAB (MISCELLANEOUS) ×2 IMPLANT
NDL SAFETY ECLIPSE 18X1.5 (NEEDLE) ×1 IMPLANT
NEEDLE HYPO 18GX1.5 SHARP (NEEDLE) ×1
NEEDLE HYPO 22GX1.5 SAFETY (NEEDLE) ×2 IMPLANT
NS IRRIG 1000ML POUR BTL (IV SOLUTION) ×2 IMPLANT
PACK LAMINECTOMY NEURO (CUSTOM PROCEDURE TRAY) ×2 IMPLANT
PAD ARMBOARD 7.5X6 YLW CONV (MISCELLANEOUS) ×2 IMPLANT
SPOGE SURGIFLO 8M (HEMOSTASIS) ×1
SPONGE SURGIFLO 8M (HEMOSTASIS) ×1 IMPLANT
SUT DVC VLOC 3-0 CL 6 P-12 (SUTURE) ×2 IMPLANT
SUT ETHILON 3-0 FS-10 30 BLK (SUTURE) ×2
SUT PROLENE 6 0 CC (SUTURE) ×2 IMPLANT
SUT VIC AB 0 CT1 18XCR BRD 8 (SUTURE) ×1 IMPLANT
SUT VIC AB 0 CT1 27 (SUTURE) ×2
SUT VIC AB 0 CT1 27XCR 8 STRN (SUTURE) ×2 IMPLANT
SUT VIC AB 0 CT1 8-18 (SUTURE) ×1
SUT VIC AB 2-0 CT1 18 (SUTURE) ×6 IMPLANT
SUTURE EHLN 3-0 FS-10 30 BLK (SUTURE) ×1 IMPLANT
SYR 30ML LL (SYRINGE) ×4 IMPLANT
SYR 3ML LL SCALE MARK (SYRINGE) ×2 IMPLANT
TOWEL OR 17X26 4PK STRL BLUE (TOWEL DISPOSABLE) ×6 IMPLANT
TRAY FOLEY MTR SLVR 16FR STAT (SET/KITS/TRAYS/PACK) ×2 IMPLANT
TUBE MATRX SPINL 18MM 7CM DISP (INSTRUMENTS) ×1
TUBE METRX SPINAL 18X7 DISP (INSTRUMENTS) ×1 IMPLANT
TUBING CONNECTING 10 (TUBING) ×2 IMPLANT

## 2018-10-25 NOTE — Progress Notes (Signed)
Patient lying flat in bed. Pain meds given with relief. Nausea has subsided earlier this shift. Dressing on back clean and intact. Scd's and teds in place. No needs or complaints at this time. Bed low, alarm on, call bell in reach.

## 2018-10-25 NOTE — Progress Notes (Signed)
Patient complaining of pain, stating she wants oxycodone which she had earlier. The order was d/c. Dr Izora Ribas text paged and MD placed orders for pain meds.

## 2018-10-25 NOTE — H&P (Signed)
History of Present Illness: 10/25/2018 Katelyn Simmons reports today with continued symptoms into her legs that have been problematic for her, and have not improved despite conservative management.  She has tried extensive injections and therapy over time.  08/28/2018 Katelyn Simmons is here today with a chief complaint of low back pain with radiation to bilateral buttocks and posterior lateral thighs to the knee. She also reports cramping in her calves, ankles and the bottom of her feet.  She has previously been evaluated by my partner Dr Alexander Bergeron on 11/29/2017.  She has been having back pain for at least 10 years, but it is worsened over the past several months. She reports sharp and throbbing pain that is stiff and achy. It is nearly constant but worsens when she stands or walks. She is unable to stand for more than 3 to 5 minutes. She can only walk 50 feet or so before having to stop due to weakness in her legs. She does report using a grocery cart when she walks or bending over. Both of these activities help her. Sitting down makes her symptoms go away.  Bowel/Bladder Dysfunction: none  Conservative measures:  Physical therapy: has participated several years ago and one visit at [facility] 12/2017 (made symptoms worse) Multimodal medical therapy including regular antiinflammatories: norco, ibuprofen, tramadol, tylenol Injections: has tried epidural steroid injections (injections in 2019 helped, last injection made symptoms worse) 06/14/2018: Right L3, L4 medial branch blocks and L5 dorsal ramus radiofrequency neurotomy 05/10/2018: LeftL3 and L66mdial branch, and L5 dorsal ramus radiofrequency neurotomy. She was unable to lie still for the right side. She was premedicated with Valium 2 mg 1 to 2 tablets 30 minutes prior to the procedure which was beneficial. (Good relief of left-sided pain) 12/08/2017: Bilateral L3, L4 medial branch and L4-5 dorsal ramus blocks (8/10 to 0/10 pain  relief) 01/11/2018: Bilateral L3, L4 medial branch and L4-5 dorsal ramus blocks (8/10 to 0/10 pain relief)  11/30/2018 from Katelyn Simmons's note: Katelyn Koltonis a 73y.o. female ding history of low back pain. She was previously placed on disability for this. She currently complains of pain in the low back that radiates to the buttocks. This pain is worse when standing or walking. It improves with sitting and with bending forward. She also reports pain that radiates from the buttocks to the posterior thighs and occasionally down the left lateral leg. The symptoms are also worse with standing and walking and improved with rest. She is previously been seen by physical therapy and initially had improvement however then developed worsening symptoms after doing bridging exercises. She has not undergone epidural steroid injections or facet blocks. She does have a history of type 2 diabetes and responds poorly in terms of her glycemic control after receiving steroid injections. No bowel or bladder incontinence. No recent trauma.  Review of Systems:  A 10 point review of systems is negative, except for the pertinent positives and negatives detailed in the HPI.  Past Medical History: Past Medical History:  Diagnosis Date  . Abdominal pain, recurrent 12/10/2015  . Abnormal barium enema 03/18/2016  . Abnormal barium swallow 03/18/2016  . Allergic state  . Anxiety state 02/24/2017  . Arthritis  . Complete rupture of rotator cuff  . Current moderate episode of major depressive disorder without prior episode (CMS-HCC) 04/15/2016  . Dry eyes, bilateral  . Dysphagia 03/18/2016  . Esophageal stricture  . Fatty infiltration of liver 12/10/2015  . GERD (gastroesophageal reflux disease)  . H/O fibromyalgia  .  Hyperlipidemia  . Hypertension  . Kidney stones  . Pernicious anemia  . Toxic multinodular goiter without mention of thyrotoxic crisis or storm  . Type 2 diabetes mellitus (CMS-HCC)   Past Surgical  History: Past Surgical History:  Procedure Laterality Date  . CHOLECYSTECTOMY  . CHOLECYSTECTOMY  . EGD 12/22/2011  Barrett's Esophagus  . EGD 08/02/2013  [place] Barrett's Esophagus: CBF 08/2016  . EGD 03/31/2016  Barrett's Esophagus: CBF 03/2018: Recall ltr mailed  . ENDOSCOPIC CARPAL TUNNEL RELEASE Bilateral  . HYSTERECTOMY  . hysterectomy without oophorectomy [place]  . Light run  into stomach for Barrett's disease   Allergies:  Allergies  Allergen Reactions  . Sulfa Antibiotics Swelling    Swelling of face  . Eggs Or Egg-Derived Products Nausea And Vomiting  . Parafon Forte Dsc [Chlorzoxazone] Other (See Comments)    Unknown reaction. Patient does not remember taking this medication  . Codeine Other (See Comments)    Jittery     Medications:  Current Meds  Medication Sig  . aspirin EC 81 MG tablet Take 81 mg by mouth every evening.  . Biotin 1000 MCG tablet Take 1,000 mcg by mouth daily.  . Blood Glucose Monitoring Suppl (GMATE SMART METER KIT) DEVI by Does not apply route.  . Cholecalciferol (D3-1000) 1000 units capsule Take 1,000 Units by mouth every evening.   . cycloSPORINE (RESTASIS) 0.05 % ophthalmic emulsion Place 1 drop into both eyes 2 (two) times daily.  . enalapril (VASOTEC) 5 MG tablet Take 5 mg by mouth 2 (two) times daily.   . fenofibrate 160 MG tablet Take 160 mg by mouth at bedtime.   Marland Kitchen glimepiride (AMARYL) 4 MG tablet Take 4 mg by mouth daily with breakfast.  . glucose blood test strip 1 each by Other route as needed for other. Use as instructed  . hydrochlorothiazide (HYDRODIURIL) 12.5 MG tablet Take 12.5 mg by mouth daily.  . Lancets Misc. KIT by Does not apply route.  Marland Kitchen levothyroxine (SYNTHROID) 125 MCG tablet Take 125 mcg by mouth daily before breakfast.  . liothyronine (CYTOMEL) 5 MCG tablet Take 5 mcg by mouth daily.  . metFORMIN (GLUCOPHAGE) 1000 MG tablet Take 1,000 mg by mouth 2 (two) times daily with a meal.  . metoprolol succinate  (TOPROL-XL) 100 MG 24 hr tablet Take 100 mg by mouth daily. Take with or immediately following a meal.  . Misc Natural Products (LUTEIN 20 PO) Take 20 mg by mouth daily.  . pantoprazole (PROTONIX) 40 MG tablet Take 40 mg by mouth daily before breakfast.   . pioglitazone (ACTOS) 45 MG tablet Take 45 mg by mouth daily.  . rosuvastatin (CRESTOR) 10 MG tablet Take 10 mg by mouth at bedtime.   Marland Kitchen venlafaxine XR (EFFEXOR-XR) 37.5 MG 24 hr capsule Take 37.5 mg by mouth daily with breakfast.  . vitamin B-12 (CYANOCOBALAMIN) 1000 MCG tablet Take 1,000 mcg by mouth daily.    Social History: Social History   Tobacco Use  . Smoking status: Never Smoker  . Smokeless tobacco: Never Used  Substance Use Topics  . Alcohol use: Yes  Alcohol/week: 1.0 standard drinks  Types: 1 Shots of liquor per week  Frequency: Monthly or less  Drinks per session: 1 or 2  Binge frequency: Never  Comment: rare  . Drug use: No   Family Medical History: Family History  Problem Relation Age of Onset  . Nephrolithiasis Mother  . High blood pressure (Hypertension) Mother  . Osteoarthritis Mother  . High  blood pressure (Hypertension) Father  . Diabetes type II Father  . Diabetes type II Sister  dead  . High blood pressure (Hypertension) Sister  . Heart disease Maternal Grandmother  . Alcohol abuse Maternal Grandfather  . No Known Problems Paternal Grandmother  . No Known Problems Paternal Grandfather  . Crohn's disease Son   Physical Examination:  Vitals:   10/25/18 0613  BP: (!) 142/57  Pulse: 78  Resp: 16  Temp: (!) 97.3 F (36.3 C)  SpO2: 100%   Heart sounds normal no MRG. Chest Clear to Auscultation Bilaterally.   General: Patient is well developed, well nourished, calm, collected, and in no apparent distress. Attention to examination is appropriate.  Psychiatric: Patient is non-anxious.  Head: Pupils equal, round, and reactive to light.  ENT: Oral mucosa appears well hydrated.  Neck:  Supple. Full range of motion.  Respiratory: Patient is breathing without any difficulty.  Extremities: No edema.  Vascular: Palpable dorsal pedal pulses.  Skin: On exposed skin, there are no abnormal skin lesions.  NEUROLOGICAL:   Awake, alert, oriented to person, place, and time. Speech is clear and fluent. Fund of knowledge is appropriate.   Cranial Nerves: Pupils equal round and reactive to light. Facial tone is symmetric. Facial sensation is symmetric. Shoulder shrug is symmetric. Tongue protrusion is midline. There is no pronator drift.  ROM of spine: full. Palpation of spine: non tender.   Strength: Side Biceps Triceps Deltoid Interossei Grip Wrist Ext. Wrist Flex.  R '5 5 5 5 5 5 5  ' L '5 5 5 5 5 5 5   ' Side Iliopsoas Quads Hamstring PF DF EHL  R '5 5 5 5 5 5  ' L '5 5 5 5 5 5   ' Reflexes are 1+ and symmetric at the biceps, triceps, brachioradialis, patella and achilles. Hoffman's is absent. Clonus is not present. Toes are down-going.  Bilateral upper and lower extremity sensation is intact to light touch.  Gait is antalgic. No difficulty with tandem gait.  No evidence of dysmetria noted.  Medical Decision Making  Imaging: MRI L spine 08/24/2018 Disc levels:  T12-L1: Mild disc and facet degeneration without stenosis  L1-2: Mild disc and facet degeneration without stenosis  L2-3: Mild disc degeneration with mild disc bulging and spurring. Moderate facet hypertrophy. Mild spinal stenosis unchanged from the prior study  L3-4: Disc degeneration with diffuse disc bulging. Extraforaminal disc protrusion on the right is unchanged. Bilateral facet hypertrophy. Moderate spinal stenosis. Moderate subarticular stenosis right greater than left  L4-5: 6 mm anterolisthesis with advanced facet degeneration. Severe spinal stenosis unchanged from the prior study. Neural foramen adequate bilaterally.  L5-S1: Normal disc space. Mild facet degeneration without  stenosis.  IMPRESSION: Multilevel degenerative change and stenosis, similar to the prior MRI 03/03/2017  Moderate spinal stenosis L3-4 unchanged  Severe spinal stenosis with grade 1 anterolisthesis at L4-5, unchanged.  Electronically Signed By: Franchot Gallo M.D. On: 08/24/2018 08:24  I have personally reviewed the images and agree with the above interpretation.  Assessment and Plan: Katelyn Simmons is a pleasant 73 y.o. female with anterolisthesis as well as lumbar stenosis causing neurogenic claudication. She has critical lumbar stenosis at L4-5 and moderate stenosis at L3-4.  We have discussed L3-5 lateral fusion with instrumentation and L3-5 decompression as options.  She elected for surgical decompression without fusion.  We will proceed today.  Meade Maw MD, [facility] Department of Neurosurgery

## 2018-10-25 NOTE — Anesthesia Post-op Follow-up Note (Signed)
Anesthesia QCDR form completed.        

## 2018-10-25 NOTE — Anesthesia Preprocedure Evaluation (Signed)
Anesthesia Evaluation  Patient identified by MRN, date of birth, ID band Patient awake    Reviewed: Allergy & Precautions, H&P , NPO status , Patient's Chart, lab work & pertinent test results  History of Anesthesia Complications Negative for: history of anesthetic complications  Airway Mallampati: II  TM Distance: <3 FB Neck ROM: limited    Dental  (+) Chipped, Poor Dentition   Pulmonary neg pulmonary ROS, neg shortness of breath,           Cardiovascular Exercise Tolerance: Good hypertension, (-) angina(-) Past MI and (-) DOE + dysrhythmias      Neuro/Psych PSYCHIATRIC DISORDERS  Neuromuscular disease CVA negative psych ROS   GI/Hepatic Neg liver ROS, GERD  Medicated and Controlled,  Endo/Other  diabetes, Type 2Hypothyroidism   Renal/GU CRFRenal disease     Musculoskeletal  (+) Arthritis ,   Abdominal   Peds  Hematology negative hematology ROS (+)   Anesthesia Other Findings Past Medical History: No date: Abnormal finding on EKG No date: Anemia     Comment:  pernicious anemia No date: Anxiety No date: Arthritis     Comment:  everywhere No date: B12 deficiency No date: Barrett's esophagus No date: CKD (chronic kidney disease)     Comment:  stage III ckd No date: Collagen vascular disease (HCC)     Comment:  RA No date: Depression No date: Diabetes mellitus without complication (HCC) No date: Dysphagia No date: Dyspnea     Comment:  with exertion No date: Dysrhythmia     Comment:  sinus arrythmia or just stops momentarily, per patient No date: Esophageal stricture No date: Fatty liver No date: GERD (gastroesophageal reflux disease) No date: Goiter, nontoxic, multinodular No date: History of kidney stones No date: Hx of repair of left rotator cuff No date: Hyperlipidemia No date: Hypertension No date: Hypothyroidism No date: Neuromuscular disorder (HCC)     Comment:  fibromyalgia No date:  Obesity No date: Pain No date: Rotator cuff tear arthropathy of both shoulders 2008: Shingles No date: Stroke [facility])     Comment:  mini strokes about 20 years ago.  some memory loss               residual No date: Throat pain  Past Surgical History: No date: ABDOMINAL HYSTERECTOMY No date: CHOLECYSTECTOMY No date: endoscopic carpal tunnel release; Bilateral No date: ESOPHAGOGASTRODUODENOSCOPY 03/31/2016: ESOPHAGOGASTRODUODENOSCOPY (EGD) WITH PROPOFOL; N/A     Comment:  Procedure: ESOPHAGOGASTRODUODENOSCOPY (EGD) WITH               PROPOFOL;  Surgeon: Manya Silvas, MD;  Location: [place];  Service: Endoscopy;  Laterality: N/A; 10/2018: UPPER GI ENDOSCOPY     Comment:  light run  BMI    Body Mass Index: 35.48 kg/m      Reproductive/Obstetrics negative OB ROS                             Anesthesia Physical Anesthesia Plan  ASA: III  Anesthesia Plan: General ETT   Post-op Pain Management:    Induction: Intravenous  PONV Risk Score and Plan: Ondansetron, Dexamethasone, Midazolam and Treatment may vary due to age or medical condition  Airway Management Planned: Oral ETT  Additional Equipment:   Intra-op Plan:   Post-operative Plan: Extubation in OR  Informed Consent: I have reviewed the patients History and Physical, chart, labs and  discussed the procedure including the risks, benefits and alternatives for the proposed anesthesia with the patient or authorized representative who has indicated his/her understanding and acceptance.     Dental Advisory Given  Plan Discussed with: Anesthesiologist, CRNA and Surgeon  Anesthesia Plan Comments: (Patient consented for risks of anesthesia including but not limited to:  - adverse reactions to medications - damage to teeth, lips or other oral mucosa - sore throat or hoarseness - Damage to heart, brain, lungs or loss of life  Patient voiced understanding.)         Anesthesia Quick Evaluation

## 2018-10-25 NOTE — Anesthesia Procedure Notes (Signed)
Procedure Name: Intubation Performed by: Staphanie Harbison, CRNA Pre-anesthesia Checklist: Patient identified, Patient being monitored, Timeout performed, Emergency Drugs available and Suction available Patient Re-evaluated:Patient Re-evaluated prior to induction Oxygen Delivery Method: Circle system utilized Preoxygenation: Pre-oxygenation with 100% oxygen Induction Type: IV induction Ventilation: Mask ventilation without difficulty Laryngoscope Size: 3 and McGraph Grade View: Grade I Tube type: Oral Tube size: 7.0 mm Number of attempts: 1 Airway Equipment and Method: Stylet and Video-laryngoscopy Placement Confirmation: ETT inserted through vocal cords under direct vision,  positive ETCO2 and breath sounds checked- equal and bilateral Secured at: 21 cm Tube secured with: Tape Dental Injury: Teeth and Oropharynx as per pre-operative assessment        

## 2018-10-25 NOTE — Progress Notes (Signed)
Pt meds held due to nausea and vomiting. Dr Diamantina Providence notified. He will give new orders. Pdowless, rn

## 2018-10-25 NOTE — Op Note (Signed)
Indications: Katelyn Simmons is a 73 yo female who presented with neurogenic claudication due to lumbar stenosis and anterolisthesis.  Findings: correction of stenosis, dural repair  Preoperative Diagnosis: Lumbar Stenosis with neurogenic claudication Postoperative Diagnosis: same   EBL: 75 ml IVF: 800 ml Drains: none Disposition: Extubated and Stable to PACU Complications: none  No foley catheter was placed.   Preoperative Note:   Risks of surgery discussed include: infection, bleeding, stroke, coma, death, paralysis, CSF leak, nerve/spinal cord injury, numbness, tingling, weakness, complex regional pain syndrome, recurrent stenosis and/or disc herniation, vascular injury, development of instability, neck/back pain, need for further surgery, persistent symptoms, development of deformity, and the risks of anesthesia. The patient understood these risks and agreed to proceed.  Operative Note:   1. L3-5 lumbar decompression including central laminectomy and bilateral medial facetectomies including foraminotomies  The patient was then brought from the preoperative center with intravenous access established.  The patient underwent general anesthesia and endotracheal tube intubation, and was then rotated on the [place] rail top where all pressure points were appropriately padded.  The skin was then thoroughly cleansed.  Perioperative antibiotic prophylaxis was administered.  Sterile prep and drapes were then applied and a timeout was then observed.  C-arm was brought into the field under sterile conditions and under lateral visualization the L4-5 interspace was identified and marked.  The incision was marked on the right and injected with local anesthetic. Once this was complete a 4 cm incision was opened with the use of a #10 blade knife.    The metrx tubes were sequentially advanced and confirmed in position at L4-5. An 12mm by 7mm tube was locked in place to the bed side attachment.  The  microscope was then sterilely brought into the field and muscle creep was hemostased with a bipolar and resected with a pituitary rongeur.  A Bovie extender was then used to expose the spinous process and lamina.  Careful attention was placed to not violate the facet capsule. A 3 mm matchstick drill bit was then used to make a hemi-laminotomy trough until the ligamentum flavum was exposed.  This was extended to the base of the spinous process and to the contralateral side to remove all the central bone from each side.  Once this was complete and the underlying ligamentum flavum was visualized, it was dissected with a curette and resected with Kerrison rongeurs.  Extensive ligamentum hypertrophy was noted, requiring a substantial amount of time and care for removal.  The dura was identified and palpated. The kerrison rongeur was then used to remove the medial facet bilaterally until no compression was noted.  A balltip probe was used to confirm decompression of the ipsilateral L5 nerve root.  Additional attention was paid to completion of the contralateral L4-5 foraminotomy until the contralateral traversing nerve root was completely free.  Once this was complete, L4-5 central decompression including medial facetectomy and foraminotomy was confirmed and decompression on both sides was confirmed.   During central decompression, a small durotomy was noted.  A larger rootlet extruded from the defect.  I attempted to reduce the rootlet back into the dural sac, but could not through the tube.  Thus, I made the intraoperative decision to open for the L3-4 decompression to allow for repair of the dura and reduction of the rootlet herniation.  The tube system was then removed under microscopic visualization and hemostasis was obtained with a bipolar.    After performing the decompression at L4-5, I opened the incision wider  superiorly and inferiorly.  The fascia was identified and opened on the right from mid L3 to  mid L5.  The paraspinus muscles were reflected laterally until the L3-4 and L4-5 facets were identified on the right.  The prior L4-5 laminotomy was noted.   A 3 mm matchstick drill bit was then used to make a hemi-laminotomy trough until the ligamentum flavum was exposed.  This was extended to the base of the spinous process and to the contralateral side to remove all the central bone from each side.  Once this was complete and the underlying ligamentum flavum was visualized, it was dissected with a curette and resected with Kerrison rongeurs.  Extensive ligamentum hypertrophy was noted, requiring a substantial amount of time and care for removal.  The dura was identified and palpated. The kerrison rongeur was then used to remove the medial facet bilaterally until no compression was noted.  A balltip probe was used to confirm decompression of the ipsilateral L4 nerve root.  Additional attention was paid to completion of the contralateral foraminotomy until the contralateral L4 nerve root was completely free.  Once this was complete, L3-4 central decompression including medial facetectomy and foraminotomy was confirmed and decompression on both sides was confirmed. No CSF leak was noted.  We then protected the prior area of durotomy, and removed the superior portion of the right L4 hemilamina until no bone was remaining and the dura was visible throughout.  At this point, the dural defect was fully identified and dissected circumferentially.    The nerve roots were reduced into the defect, then the dural defect was repaired using 5-0 prolene.  A piece of autologous muscle was used to buttress the repair.  No CSF leak was noted.  We then obtained hemostasis.  Duragen and tisseel were used to augment the closure.    The fascial layer was reapproximated with the use of a 0 Vicryl suture.  Subcutaneous tissue layer was reapproximated using 2-0 Vicryl suture.  3-0 nylon was used to close the dermis.  Patient  was then rotated back to the preoperative bed awakened from anesthesia and taken to recovery all counts are correct in this case.  I performed the entire procedure with the assistance of Marin Olp PA as an Pensions consultant.  Katelyn Simmons K. Izora Ribas MD

## 2018-10-25 NOTE — Transfer of Care (Signed)
Immediate Anesthesia Transfer of Care Note  Patient: Katelyn Simmons  Procedure(s) Performed: LUMBAR LAMINECTOMY/DECOMPRESSION MICRODISCECTOMY 2 LEVELS L3-5 (N/A )  Patient Location: PACU  Anesthesia Type:General  Level of Consciousness: awake and alert   Airway & Oxygen Therapy: Patient Spontanous Breathing and Patient connected to face mask oxygen  Post-op Assessment: Report given to RN and Post -op Vital signs reviewed and stable  Post vital signs: Reviewed  Last Vitals:  Vitals Value Taken Time  BP 129/62 10/25/18 1218  Temp    Pulse 110 10/25/18 1219  Resp 25 10/25/18 1219  SpO2 96 % 10/25/18 1219  Vitals shown include unvalidated device data.  Last Pain:  Vitals:   10/25/18 0613  TempSrc: Tympanic  PainSc: 0-No pain         Complications: No apparent anesthesia complications

## 2018-10-26 ENCOUNTER — Encounter: Payer: Self-pay | Admitting: Neurosurgery

## 2018-10-26 DIAGNOSIS — M4316 Spondylolisthesis, lumbar region: Secondary | ICD-10-CM | POA: Diagnosis not present

## 2018-10-26 DIAGNOSIS — M48062 Spinal stenosis, lumbar region with neurogenic claudication: Secondary | ICD-10-CM | POA: Diagnosis not present

## 2018-10-26 DIAGNOSIS — K219 Gastro-esophageal reflux disease without esophagitis: Secondary | ICD-10-CM | POA: Diagnosis not present

## 2018-10-26 DIAGNOSIS — M5416 Radiculopathy, lumbar region: Secondary | ICD-10-CM | POA: Diagnosis not present

## 2018-10-26 DIAGNOSIS — M1990 Unspecified osteoarthritis, unspecified site: Secondary | ICD-10-CM | POA: Diagnosis not present

## 2018-10-26 DIAGNOSIS — E039 Hypothyroidism, unspecified: Secondary | ICD-10-CM | POA: Diagnosis not present

## 2018-10-26 DIAGNOSIS — I129 Hypertensive chronic kidney disease with stage 1 through stage 4 chronic kidney disease, or unspecified chronic kidney disease: Secondary | ICD-10-CM | POA: Diagnosis not present

## 2018-10-26 DIAGNOSIS — E785 Hyperlipidemia, unspecified: Secondary | ICD-10-CM | POA: Diagnosis not present

## 2018-10-26 DIAGNOSIS — E1122 Type 2 diabetes mellitus with diabetic chronic kidney disease: Secondary | ICD-10-CM | POA: Diagnosis not present

## 2018-10-26 LAB — GLUCOSE, CAPILLARY
Glucose-Capillary: 104 mg/dL — ABNORMAL HIGH (ref 70–99)
Glucose-Capillary: 163 mg/dL — ABNORMAL HIGH (ref 70–99)

## 2018-10-26 LAB — HEMOGLOBIN A1C
Hgb A1c MFr Bld: 5.7 % — ABNORMAL HIGH (ref 4.8–5.6)
Mean Plasma Glucose: 117 mg/dL

## 2018-10-26 MED ORDER — OXYCODONE HCL 5 MG PO TABS: 5.0000 mg | ORAL_TABLET | ORAL | 0 refills | Status: DC | PRN

## 2018-10-26 MED ORDER — METHOCARBAMOL 500 MG PO TABS: 500.0000 mg | ORAL_TABLET | Freq: Four times a day (QID) | ORAL | 0 refills | Status: DC

## 2018-10-26 MED ORDER — CHLORHEXIDINE GLUCONATE CLOTH 2 % EX PADS: 6.0000 | MEDICATED_PAD | Freq: Every day | CUTANEOUS | Status: DC

## 2018-10-26 NOTE — TOC Progression Note (Signed)
Transition of Care [facility]) - Progression Note    Patient Details  Name: Katelyn Simmons MRN: [id number] Date of Birth: 01-08-1946  Transition of Care [facility]) CM/SW N[place], RN Phone Number: 10/26/2018, 11:03 AM  Clinical Narrative:     Met with the patient to discuss DC plan and needs She lives at home with Husband and has been doing really well.  She was set up with [facility] prior to surgery per Blanchard Mane they are unable to accept her, I let the patient know and she stated that she will use [facility], I notified Helene Kelp with [facility].  The patient would benefit from a RW she said, I let Leroy Sea know from [facility] Her husband provides transportation and she can afford her medications       Expected Discharge Plan and Services           Expected Discharge Date: 10/26/18                                     Social Determinants of Health (SDOH) Interventions    Readmission Risk Interventions No flowsheet data found.

## 2018-10-26 NOTE — Anesthesia Postprocedure Evaluation (Signed)
Anesthesia Post Note  Patient: Katelyn Simmons  Procedure(s) Performed: LUMBAR LAMINECTOMY/DECOMPRESSION MICRODISCECTOMY 2 LEVELS L3-5 (N/A )  Patient location during evaluation: PACU Anesthesia Type: General Level of consciousness: awake and alert Pain management: pain level controlled Vital Signs Assessment: post-procedure vital signs reviewed and stable Respiratory status: spontaneous breathing, nonlabored ventilation, respiratory function stable and patient connected to nasal cannula oxygen Cardiovascular status: blood pressure returned to baseline and stable Postop Assessment: no apparent nausea or vomiting Anesthetic complications: no     Last Vitals:  Vitals:   10/25/18 2342 10/26/18 0338  BP: (!) 98/37 (!) 114/51  Pulse: 71 80  Resp: 18 18  Temp: 37 C 37.2 C  SpO2: 98% 100%    Last Pain:  Vitals:   10/26/18 0551  TempSrc:   PainSc: Asleep                 Precious Haws Jacarius Handel

## 2018-10-26 NOTE — Progress Notes (Signed)
Pt given Discharge instructions, follow up appointments and hard rx . Taken to personal vehicle with Husband without incident to return home .

## 2018-10-26 NOTE — Discharge Summary (Signed)
Procedure: L3-5 lumbar decompression Procedure date: 10/25/2018 Diagnosis: Lumbar radiculopathy  History: Katelyn Simmons is s/p L3-5 lumbar decompression POD1: She is recovering well. Pain currently rated 2-3/10 sitting down. Complains of hip pain but feels it is related to having to lay flat all night. She ambulated this morning without issue and hip pain improved. Lower extremity symptoms present prior to surgery have resolved. Back pain has also improved. Tolerating liquid and soft diet without issue. Voiding without issue.  Denies andy new lower extremity pain/numbness/tingling/weakness, headache, or nausea.   Physical Exam: Vitals:   10/26/18 0338 10/26/18 0732  BP: (!) 114/51 (!) 104/40  Pulse: 80 72  Resp: 18   Temp: 98.9 F (37.2 C) 98.4 F (36.9 C)  SpO2: 100% 98%    AA Ox3 Strength:5/5 throughout lower extremities Sensation: intact and symmetric throughout lower extremities.  Skin: Dressing intact  Data:  No results for input(s): NA, K, CL, CO2, BUN, CREATININE, LABGLOM, GLUCOSE, CALCIUM in the last 168 hours. No results for input(s): AST, ALT, ALKPHOS in the last 168 hours.  Invalid input(s): TBILI   No results for input(s): WBC, HGB, HCT, PLT in the last 168 hours. No results for input(s): APTT, INR in the last 168 hours.       Other tests/results: No imaging reviewed  Assessment/Plan:  Katelyn Simmons is POD1 S/p lumbar decompression. She is recovering well and ready to go home. Will continue post op pain control with tylenol, pain medication, and muscle relaxer as needed. Discussed activity restrictions. She is scheduled to follow up in 2 weeks in clinic to monitor progress. Advised to contact office if any questions or concerns arise before then.   Marin Olp PA-C Department of Neurosurgery

## 2018-10-26 NOTE — Discharge Instructions (Signed)

## 2018-10-26 NOTE — Evaluation (Addendum)
Physical Therapy Evaluation Patient Details Name: Katelyn Simmons MRN: [id number] DOB: 1945/02/15 Today's Date: 10/26/2018   History of Present Illness  From MD op-note: Katelyn Simmons is a 73 yo female who presented with neurogenic claudication due to lumbar stenosis and anterolisthesis. She is now s/p L3-5 lumbar decompression including central laminectomy and bilateral medial facetectomies including foraminotomies.  Clinical Impression  Pt 73 yo female admitted for above. Pt up in recliner with spouse present upon arrival and agreed to participate with PT. Pt educated on precautions and HEP. Pt performed therex with min cuing for correct performance. Pt educated on therex progression including standing leg exercises at counter with PT providing demonstration and pt and spouse verbalizing understanding. Pt educated on benefits of ambulation during recovery and encouraged to ambulated multiple times throughout the day as well as not sitting for longer than 45 min at a time. Pt performed all functional mobility tasks with min guard assist. Pt performed up/down 4 stairs x2 trials with spouse present and educated on proper techniques for assisting pt. Pt and spouse educated on car transfer and maintaining precautions during transfer with both verbalizing understanding. Pt has no concerns about returning home.  Pt presents with decreased strength, balance and activity tolerance consistent with recent surgery. Pt would benefit from home health PT following hospital discharge to further improve deficits, increase independence to allow for return to PLOF and decrease caregiver burden.     Follow Up Recommendations Home health PT    Equipment Recommendations  Rolling walker with 5" wheels    Recommendations for Other Services       Precautions / Restrictions Precautions Precautions: Back Precaution Booklet Issued: Yes (comment) Precaution Comments: No Bending, Arching, or Twisting, pt able to recall 2/3  precautions consistently Restrictions Weight Bearing Restrictions: No      Mobility  Bed Mobility         General bed mobility comments: pt up in recliner upon arrival, pt reports she feels comfortable getting in/out of bed within precautions and that she was taught by OT proper log roll technique, pt reports that was how she got out of the bed prior to surgery  Transfers Overall transfer level: Needs assistance Equipment used: Rolling walker (2 wheeled) Transfers: Sit to/from Stand Sit to Stand: Min guard         General transfer comment: pt performed STS from recliner with no physical assist, no cuing required for maintaining precautions, pt safe and steady  Ambulation/Gait Ambulation/Gait assistance: Min guard Gait Distance (Feet): 200 Feet Assistive device: Rolling walker (2 wheeled) Gait Pattern/deviations: Step-through pattern;Decreased stride length Gait velocity: decreased   General Gait Details: pt ambulates with slow, cautious, guarded gait pattern but very safe and steady, no unsteadiness, buckling or LOB noted, pt reports this was her third time ambulating around nurses station  Stairs Stairs: Yes Stairs assistance: Min guard Stair Management: One rail Right;Step to pattern;Forwards Number of Stairs: 4 General stair comments: pt performed two trials one with B handrails and one with single handrail, pt educated with up with the good down with the bad pattern, pt safe and steady with stairs, pt requires increased time and use of handrail, pts spouse educated on bringing RW to the top/bottom of stairs prior to stair navigation and how to provide proper assistance  Wheelchair Mobility    Modified Rankin (Stroke Patients Only)       Balance Overall balance assessment: No apparent balance deficits (not formally assessed)  Pertinent Vitals/Pain Pain Assessment: 0-10 Pain Score: 3  Pain Location:  Low back Pain Descriptors / Indicators: Sore;Operative site guarding Pain Intervention(s): Limited activity within patient's tolerance;Monitored during session    [place] expects to be discharged to:: Private residence Living Arrangements: Spouse/significant other Available Help at Discharge: Family;Available 24 hours/day Type of Home: House Home Access: Stairs to enter Entrance Stairs-Rails: Left;Right(unsure if can reach both at same time) Entrance Stairs-Number of Steps: 3 Home Layout: One level Home Equipment: Hand held shower head      Prior Function Level of Independence: Independent         Comments: Pt reports she is generally independent. Uses a shopping cart or her husband for support during community ambulation. Otherwise manages all ADL/IADLs independently     Hand Dominance   Dominant Hand: Right    Extremity/Trunk Assessment   Upper Extremity Assessment Upper Extremity Assessment: Defer to OT evaluation    Lower Extremity Assessment Lower Extremity Assessment: Generalized weakness       Communication   Communication: No difficulties  Cognition Arousal/Alertness: Awake/alert Behavior During Therapy: WFL for tasks assessed/performed Overall Cognitive Status: Within Functional Limits for tasks assessed                                        General Comments      Exercises Total Joint Exercises Ankle Circles/Pumps: AROM;Both;10 reps Gluteal Sets: AROM;Both;10 reps Towel Squeeze: AROM;Both;10 reps Long Arc Quad: AROM;Both;10 reps Other Exercises Other Exercises:    Assessment/Plan    PT Assessment Patient needs continued PT services  PT Problem List Decreased strength;Decreased mobility;Decreased activity tolerance;Decreased balance;Decreased range of motion;Decreased knowledge of precautions       PT Treatment Interventions DME instruction;Functional mobility training;Balance training;Patient/family  education;Gait training;Stair training;Therapeutic exercise;Therapeutic activities;Neuromuscular re-education    PT Goals (Current goals can be found in the Care Plan section)  Acute Rehab PT Goals Patient Stated Goal: to get home and back to my normal activities PT Goal Formulation: With patient Time For Goal Achievement: 11/09/18 Potential to Achieve Goals: Good    Frequency 7X/week   Barriers to discharge        Co-evaluation               AM-PAC PT "6 Clicks" Mobility  Outcome Measure Help needed turning from your back to your side while in a flat bed without using bedrails?: None Help needed moving from lying on your back to sitting on the side of a flat bed without using bedrails?: A Little Help needed moving to and from a bed to a chair (including a wheelchair)?: None Help needed standing up from a chair using your arms (e.g., wheelchair or bedside chair)?: None Help needed to walk in hospital room?: A Little Help needed climbing 3-5 steps with a railing? : A Little 6 Click Score: 21    End of Session Equipment Utilized During Treatment: Gait belt Activity Tolerance: Patient tolerated treatment well Patient left: in chair;with call bell/phone within reach;with family/visitor present Nurse Communication: Mobility status PT Visit Diagnosis: Other abnormalities of gait and mobility (R26.89);Difficulty in walking, not elsewhere classified (R26.2);Muscle weakness (generalized) (M62.81)    Time: [id number] PT Time Calculation (min) (ACUTE ONLY): 22 min   Charges:   PT Evaluation $PT Eval Low Complexity: 1 Low         Reilley Valentine PT, DPT 12:02 PM,10/26/18 [phone]

## 2018-10-26 NOTE — Evaluation (Signed)
Occupational Therapy Evaluation Patient Details Name: Katelyn Simmons MRN: [id number] DOB: [birth year] Today's Date: 10/26/2018    History of Present Illness From MD op-note: Katelyn Simmons is a 73 yo female who presented with neurogenic claudication due to lumbar stenosis and anterolisthesis. She is now s/p L3-5 lumbar decompression including central laminectomy and bilateral medial facetectomies including foraminotomies.   Clinical Impression   Katelyn Simmons was seen for OT evaluation this date, POD#1 from lumbar fusion. Prior to hospital admission, Katelyn Simmons was independent with mobility, ADL, and IADL. No falls in past 12 months. However, has been having increased difficulty with all aspects of mobility and ADL due to back pain. Katelyn Simmons lives with spouse in a single family home with 3 steps to enter and bilateral hand rails (could not reach both) with spouse able to provide 24/7 assist/support as needed for Katelyn Simmons. Currently Katelyn Simmons is at supervision level with all aspects of mobility and min-moderate assist for LB ADL tasks. Katelyn Simmons educated in back precautions with handout provided, self care skills, bed mobility and functional transfer training, AE/DME for bathing, dressing, and toileting needs, and home/routines modifications and falls prevention strategies to maximize safety and functional independence while minimizing falls risk and maintaining precautions. Katelyn Simmons verbalized understanding of all education/training provided. Handout provided to support recall and carry over of learned precautions/techniques for bed mobility, functional transfers, and self care skills. Katelyn Simmons would benefit from skilled OT to address noted impairments and functional limitations in order to maximize safety and independence while minimizing falls risk and caregiver burden.  Upon hospital discharge, recommend HHOT to maximize Katelyn Simmons safety and return to meaningful occupations of daily life.     Follow Up Recommendations  Home health OT    Equipment  Recommendations  Tub/shower seat    Recommendations for Other Services       Precautions / Restrictions Precautions Precautions: Back Precaution Booklet Issued: Yes (comment) Precaution Comments: No Bending, Arching, or Twisting Restrictions Weight Bearing Restrictions: No      Mobility Bed Mobility Overal bed mobility: Needs Assistance Bed Mobility: Sit to Sidelying;Sidelying to Sit;Rolling Rolling: Supervision;Modified independent (Device/Increase time) Sidelying to sit: Min assist     Sit to sidelying: Supervision;Modified independent (Device/Increase time) General bed mobility comments: Katelyn Simmons instructed in log-roll bed mobility technique this date. She required min assist to come to sitting from sidelying 2/2 decreased strength and ROM in BUE. Katelyn Simmons supervision level for all remaining bed mobility this date.  Transfers Overall transfer level: Modified independent Equipment used: None             General transfer comment: Katelyn Simmons completes STS x2 from low and elevated surface this date. Increased time/effort from low surface with VC's consistently for adherence to back precatuions.    Balance Overall balance assessment: No apparent balance deficits (not formally assessed)                                         ADL either performed or assessed with clinical judgement   ADL                                         General ADL Comments: Katelyn Simmons requires assistance for mgt of some upper body bathing activites at baseline due to decrased AROM of B shoulders. Katelyn Simmons currently limited due to  low back pain and back precautions. Discussed use of toilet aid to support independence with peri-care. Katelyn Simmons req min to mod assist for LB ADL mgt in seated position. Able to return demo use of LH reacher and sock aid to doff/don socks this date with VCs for technique.     Vision Baseline Vision/History: Wears glasses Wears Glasses: At all times Patient Visual Report:  No change from baseline       Perception     Praxis      Pertinent Vitals/Pain Pain Assessment: 0-10 Pain Score: 2  Pain Location: Low back Pain Descriptors / Indicators: Sore Pain Intervention(s): Limited activity within patient's tolerance;Monitored during session     Hand Dominance Right   Extremity/Trunk Assessment Upper Extremity Assessment Upper Extremity Assessment: Generalized weakness(Katelyn Simmons ROM limited in BUE 2/2 bilat rotator cuff tears per Katelyn Simmons. Unable to raise arms above 90 with shoulder flexion/abduction. Grip and biceps WFL. [facility] WFL.)   Lower Extremity Assessment Lower Extremity Assessment: Overall WFL for tasks assessed;Defer to Katelyn Simmons evaluation       Communication Communication Communication: No difficulties   Cognition Arousal/Alertness: Awake/alert Behavior During Therapy: WFL for tasks assessed/performed Overall Cognitive Status: Within Functional Limits for tasks assessed                                     General Comments       Exercises Other Exercises Other Exercises: Katelyn Simmons educated in falls prevention strategies, back precautions (including no bending, lifting, twisting, or arching) handout provided, AE/DME for adl mgt, and bed mobility strategies including the log roll technique. Katelyn Simmons return demonstrated log-roll technique and use of LHR and sock aid to doff/don socks this date.   Shoulder Instructions      Home Living Family/patient expects to be discharged to:: Private residence Living Arrangements: Spouse/significant other Available Help at Discharge: Family;Available 24 hours/day(Husband and sister able to assist) Type of Home: House Home Access: Stairs to enter [employer] of Steps: 3 Entrance Stairs-Rails: Left;Right Home Layout: One level     Bathroom Shower/Tub: Teacher, early years/pre: Handicapped height Bathroom Accessibility: Yes How Accessible: Accessible via walker Home Equipment: Hand held shower  head          Prior Functioning/Environment Level of Independence: Independent        Comments: Katelyn Simmons reports she is generally independent. Uses a shopping cart or her husband for support during community ambulation. Otherwise manages all ADL/IADLs independently        OT Problem List: Decreased strength;Decreased range of motion;Pain;Decreased activity tolerance;Decreased safety awareness;Decreased knowledge of use of DME or AE;Decreased knowledge of precautions;Impaired UE functional use      OT Treatment/Interventions: Self-care/ADL training;Balance training;Therapeutic exercise;Therapeutic activities;DME and/or AE instruction;Energy conservation;Patient/family education    OT Goals(Current goals can be found in the care plan section) Acute Rehab OT Goals Patient Stated Goal: To get back to mowing my lawn and being active again OT Goal Formulation: With patient Time For Goal Achievement: 11/09/18 Potential to Achieve Goals: Good ADL Goals Katelyn Simmons Will Perform Lower Body Bathing: sitting/lateral leans;with set-up;with supervision(With LRAD PRN for improved safety and functional independence.) Katelyn Simmons Will Perform Lower Body Dressing: with adaptive equipment;with modified independence;sit to/from stand(With LRAD PRN for improved safety and functional independence.) Katelyn Simmons Will Transfer to Toilet: ambulating;with modified independence;regular height toilet(With LRAD PRN for improved safety and functional independence.) Katelyn Simmons Will Perform Toileting - Clothing Manipulation and hygiene: with  modified independence;sit to/from stand(With LRAD PRN for improved safety and functional independence.)  OT Frequency: Min 1X/week   Barriers to D/C:            Co-evaluation              AM-PAC OT "6 Clicks" Daily Activity     Outcome Measure Help from another person eating meals?: None Help from another person taking care of personal grooming?: None Help from another person toileting, which includes  using toliet, bedpan, or urinal?: A Little Help from another person bathing (including washing, rinsing, drying)?: A Lot Help from another person to put on and taking off regular upper body clothing?: A Little Help from another person to put on and taking off regular lower body clothing?: A Lot 6 Click Score: 18   End of Session Equipment Utilized During Treatment: Gait belt  Activity Tolerance: Patient tolerated treatment well Patient left: in chair;with call bell/phone within reach;with chair alarm set;with family/visitor present  OT Visit Diagnosis: Other abnormalities of gait and mobility (R26.89);Pain Pain - part of body: (back)                Time: [id number] OT Time Calculation (min): 41 min Charges:  OT General Charges $OT Visit: 1 Visit OT Evaluation $OT Eval Low Complexity: 1 Low OT Treatments $Self Care/Home Management : 23-37 mins  Shara Blazing, M.S., OTR/L Ascom: [phone] 10/26/18, 11:33 AM

## 2018-10-27 ENCOUNTER — Other Ambulatory Visit: Payer: Self-pay | Admitting: *Deleted

## 2018-10-27 ENCOUNTER — Encounter: Payer: Self-pay | Admitting: *Deleted

## 2018-10-27 DIAGNOSIS — M4316 Spondylolisthesis, lumbar region: Secondary | ICD-10-CM | POA: Diagnosis not present

## 2018-10-27 DIAGNOSIS — E119 Type 2 diabetes mellitus without complications: Secondary | ICD-10-CM | POA: Diagnosis not present

## 2018-10-27 DIAGNOSIS — R531 Weakness: Secondary | ICD-10-CM | POA: Diagnosis not present

## 2018-10-27 DIAGNOSIS — R2689 Other abnormalities of gait and mobility: Secondary | ICD-10-CM | POA: Diagnosis not present

## 2018-10-27 DIAGNOSIS — Z7984 Long term (current) use of oral hypoglycemic drugs: Secondary | ICD-10-CM | POA: Diagnosis not present

## 2018-10-27 DIAGNOSIS — M4326 Fusion of spine, lumbar region: Secondary | ICD-10-CM | POA: Diagnosis not present

## 2018-10-27 DIAGNOSIS — M48062 Spinal stenosis, lumbar region with neurogenic claudication: Secondary | ICD-10-CM | POA: Diagnosis not present

## 2018-10-27 NOTE — Patient Outreach (Signed)
[facility]) [place] Telephone Outreach PCP completes Transition of Care follow up post-hospital discharge Post-hospital discharge day # Box Coordination x 2   10/27/2018  Katelyn Simmons 1945-08-15 [id number]  Successful telephone outreach to Katelyn Simmons, 73 y/o female referred to [facility] CM for DM management by patient's neurosurgeon after she had elective surgery/ hospitalization for lumbar laminectomy September 23-24, 2020.  Patient was discharged home to self care with home health services for PT through [facility].  Patient has history including, but not limited to, chronic back pain, GERD, DM (most recent A1-C of 5.7 10/25/2018); HTN; arthritis; and depression/ anxiety.  HIPAA/ identity verified with patient during phone call today.  [facility] CM services were discussed with patient today as a benefit of her insurance plan, and patient provides verbal consent for [facility] CM involvement in her care.    Today, patient states that she is "doing overall real good" after her recent surgery/ hospitalization, and she reports surgical pain at "3/10" which she states "is already much better than before the surgery."  Patient denies new/ recent falls and states she has had no falls over the course of the last year.  Patient reports that she is concerned that the rolling walker that was ordered at time of hospital discharge has not yet been delivered to her, stating that the nurse RN CM at the hospital told her it should arrive today.  Patient sounds to be in no distress throughout entirety of phone call today.  [place] Coordination x 2 (three-way call with patient on line): 3:05 pm: contacted Lianne Cure, IP RN CM to obtain contact information for DME company [employer] as this is not included in her note, and contact information is unavailable upon internet search 3:10 pm:  Contacted Brad at [employer]  health: [phone]), who is eventually able to confirm that he has received the order for the rolling walker and that it should be delivered to patient's home by tomorrow afternoon.  Patient appreciative of care coordination efforts, stating that she does not wish to have a fall and feels fatigued and weak after surgery- positive reinforcement provided.  Patient further reports:  Medications: -- Has all medicationsand takes as prescribed;denies questions/ concerns around current medications.  -- self-manage medications using weekly pill planner box. -- denies issues with swallowing medications -- patient declines medication review today, stating she is resting and just not up to it  Home health [facility]) services: -- [facility] services for PT in place through [place] -- confirms that [facility] services have begun; states she had PT visit this afternoon that she thought "went very well." -- confirms that she has contact information for home health team; states she expects PT visits 2-3 times per week for 2 weeks, to be re-evaluated as she progresses  Provider appointments: -- All upcoming provider appointments were reviewed with patient today; reports that she has scheduled post-surgical office visit on November 09, 2018 and PCP office visit scheduled for later in October; stated that she had recent PCP office visit 10/11/2018 and PCP asked her to return post- surgery "in 4-6 weeks" -- states she has reliable transportation to all provider appointments through her husband  Safety/ Mobility/ Falls: -- denies new/ recent falls; denies falls over last year -- assistive devices: currently using cane; awaiting walker as noted above; prior to recent surgery, patient did not use assistive devices -- general fall risks/  prevention education discussed with patient today-- encouraged patient to be conservative in activity and not to rush; to use assistive devices for ambulation  Social/ Community Resource  needs: -- currently denies community resource needs, stating supportive husband that assists with care needs as indicated after surgery; reports that she was independent in care needs ADL's iADL's prior to surgery -- husband provides transportation for patient to all provider appointments, errands, etc- patient is aware of insurance transportation benefit but does not use since her husband takes her to appointments -- SDOH completed for: transportation/ food insecurity/ financial resource strain/ depression  Advanced Directive (AD) Planning:   --reports does currently have exisisting Advanced Directives in place for HCPOA and Living Will and declines desire to make changes to either.  Self-health management of chronic pain and DM: -- patient reports using ice/ cold on surgical site to assist in pain levels; states helping; using minimal narcotic pain medications for post-op pain -- reviewed with patient her most recent A1-C of 5.7; patient states her biggest problem around DM is "running low sugars;" states that she has talked to her PCP about this and PCP is planning to re-evaluate current medications for DM when she returns for next office visit -- reports hypoglycemia "below 60-70" a couple of times each week; states she can easily tell when her blood sugars are running low; action plan for hypoglycemia: "eat or drink something right away and sit down." -- monitors/ records blood sugars 2-4 times per day; reports ranges between 80-110 post hospital discharge; encouraged patient to take all recorded values to upcoming PCP office visit with her, which she states she plans to do -- declines need for pharmacy review at this time, stating that she would rather wait to talk to PCP before getting anyone else involved.  Encouraged patient to reach out to PCP promptly if she has frequent hypoglycemic episodes prior to scheduled office visit, and she agrees to do so.  Dangers of, and action plan for  hypoglycemia discussed with patient today, who reports she is familiar with same, as she has "had these episodes" for "about 3-6 months"  Patient denies further issues, concerns, or problems today.  I provided/ confirmed that patient has my direct phone number, the main [facility] CM office phone number, and the [facility] CM 24-hour nurse advice phone number should issues arise prior to next scheduled [place] outreach by [facility] RN CM dedicated to [employer].  Encouraged patient to contact me directly if needs, questions, issues, or concerns arise prior to next scheduled outreach; patient agreed to do so.  Plan:  Patient will take medications as prescribed and will attend all scheduled provider appointments  Patient will promptly notify care providers for any new concerns/ issues/ problems that arise  Patient will actively participate in home health services as ordered post-hospital discharge  Patient will use assistive devices for ambulation post-hospital discharge  Patient will continue monitoring/ recording daily blood sugars 2-4 times a day   I will make patient's PCP aware of [place] RN CM involvement in patient's care-- will send barriers letter  I will send secure message via EMR to [facility] RN CM team dedicated to [facility] for ongoing [facility] CM outreach  [facility] CM Care Plan Problem One     Most Recent Value  Care Plan Problem One  Risk for hospital readmission related to/ as evidenced by recent surgery for lumbar laminectomy  Role Documenting the Problem One  Care Management Coordinator  Care Plan for  Problem One  Active  [facility] Long Term Goal   Over the next 31 days, patient will not experience unplanned hospital readmission, as evidenced by patient reporting and review of EMR during [facility] RN CM outreach  [place] Term Goal Start Date  10/27/18  Interventions for Problem One Long Term Goal  Discussed with patient current clinical condition since hospital discharge,  confirmed that she has  all of her medications and denies concerns around medications,  reviewed post-hospital discharge instructions with patient and confirmed that patient has reliable transportation to all scheduled provider appointments post-hospital discharge,  initiated [facility] CM program  [facility] CM Short Term Goal #1   Over the next 30 days, patient will actively participate in home health services as ordered post-hospital discharge, as evidenced by patient reporting and collaboration with home health team as indicated during [facility] RN CM outreach  [facility] CM Short Term Goal #1 Start Date  10/27/18  Interventions for Short Term Goal #1  Confirmed that patient has been contacted by home health team and that she has had home health PT visit,  confirmed that patient has contact information for home health team.  Completed care coordination outreach x 2 to [employer] DME company to facilitate having patient's recently ordered rolling walker promptly delivered    [facility] CM Care Plan Problem Two     Most Recent Value  Care Plan Problem Two  Self-health management of chronic disease state of DM with frequent hypoglycemic episodes, as evidenced by patient reporting  Role Documenting the Problem Two  Care Management Coordinator  Care Plan for Problem Two  Active  Interventions for Problem Two Long Term Goal   Discussed/ reviewed patient's recent blood sugar values and encouraged patient to take all recorded blood sugar values to upcoming PCP appointment,  discussed with patient signs/ symptoms/ dangers of hypoglycemia and reviewed action plan for hypoglycemic episodes with patient  [facility] Long Term Goal  Over the next 31 days patient will continue monitoring and recording daily blood sugars 2-4 times per day as evidenced by review of same with patient during [facility] RN CM outreach  [facility] Long Term Goal Start Date  10/27/18     Oneta Rack, RN, BSN, [place] Coordinator [facility] Care Management  [phone]

## 2018-10-31 DIAGNOSIS — R2689 Other abnormalities of gait and mobility: Secondary | ICD-10-CM | POA: Diagnosis not present

## 2018-10-31 DIAGNOSIS — Z7984 Long term (current) use of oral hypoglycemic drugs: Secondary | ICD-10-CM | POA: Diagnosis not present

## 2018-10-31 DIAGNOSIS — R531 Weakness: Secondary | ICD-10-CM | POA: Diagnosis not present

## 2018-10-31 DIAGNOSIS — M48062 Spinal stenosis, lumbar region with neurogenic claudication: Secondary | ICD-10-CM | POA: Diagnosis not present

## 2018-10-31 DIAGNOSIS — M4326 Fusion of spine, lumbar region: Secondary | ICD-10-CM | POA: Diagnosis not present

## 2018-10-31 DIAGNOSIS — E119 Type 2 diabetes mellitus without complications: Secondary | ICD-10-CM | POA: Diagnosis not present

## 2018-10-31 DIAGNOSIS — M4316 Spondylolisthesis, lumbar region: Secondary | ICD-10-CM | POA: Diagnosis not present

## 2018-11-01 DIAGNOSIS — E119 Type 2 diabetes mellitus without complications: Secondary | ICD-10-CM | POA: Diagnosis not present

## 2018-11-01 DIAGNOSIS — M48062 Spinal stenosis, lumbar region with neurogenic claudication: Secondary | ICD-10-CM | POA: Diagnosis not present

## 2018-11-01 DIAGNOSIS — Z7984 Long term (current) use of oral hypoglycemic drugs: Secondary | ICD-10-CM | POA: Diagnosis not present

## 2018-11-01 DIAGNOSIS — M4316 Spondylolisthesis, lumbar region: Secondary | ICD-10-CM | POA: Diagnosis not present

## 2018-11-01 DIAGNOSIS — R2689 Other abnormalities of gait and mobility: Secondary | ICD-10-CM | POA: Diagnosis not present

## 2018-11-01 DIAGNOSIS — R531 Weakness: Secondary | ICD-10-CM | POA: Diagnosis not present

## 2018-11-01 DIAGNOSIS — M4326 Fusion of spine, lumbar region: Secondary | ICD-10-CM | POA: Diagnosis not present

## 2018-11-03 DIAGNOSIS — R2689 Other abnormalities of gait and mobility: Secondary | ICD-10-CM | POA: Diagnosis not present

## 2018-11-03 DIAGNOSIS — Z7984 Long term (current) use of oral hypoglycemic drugs: Secondary | ICD-10-CM | POA: Diagnosis not present

## 2018-11-03 DIAGNOSIS — E119 Type 2 diabetes mellitus without complications: Secondary | ICD-10-CM | POA: Diagnosis not present

## 2018-11-03 DIAGNOSIS — M4316 Spondylolisthesis, lumbar region: Secondary | ICD-10-CM | POA: Diagnosis not present

## 2018-11-03 DIAGNOSIS — M4326 Fusion of spine, lumbar region: Secondary | ICD-10-CM | POA: Diagnosis not present

## 2018-11-03 DIAGNOSIS — M48062 Spinal stenosis, lumbar region with neurogenic claudication: Secondary | ICD-10-CM | POA: Diagnosis not present

## 2018-11-03 DIAGNOSIS — R531 Weakness: Secondary | ICD-10-CM | POA: Diagnosis not present

## 2018-11-06 DIAGNOSIS — Z7984 Long term (current) use of oral hypoglycemic drugs: Secondary | ICD-10-CM | POA: Diagnosis not present

## 2018-11-06 DIAGNOSIS — M4326 Fusion of spine, lumbar region: Secondary | ICD-10-CM | POA: Diagnosis not present

## 2018-11-06 DIAGNOSIS — E119 Type 2 diabetes mellitus without complications: Secondary | ICD-10-CM | POA: Diagnosis not present

## 2018-11-06 DIAGNOSIS — R2689 Other abnormalities of gait and mobility: Secondary | ICD-10-CM | POA: Diagnosis not present

## 2018-11-06 DIAGNOSIS — M4316 Spondylolisthesis, lumbar region: Secondary | ICD-10-CM | POA: Diagnosis not present

## 2018-11-06 DIAGNOSIS — M48062 Spinal stenosis, lumbar region with neurogenic claudication: Secondary | ICD-10-CM | POA: Diagnosis not present

## 2018-11-06 DIAGNOSIS — R531 Weakness: Secondary | ICD-10-CM | POA: Diagnosis not present

## 2018-11-07 ENCOUNTER — Other Ambulatory Visit: Payer: Self-pay

## 2018-11-07 NOTE — Patient Outreach (Signed)
[place] [facility]) Care Management  11/07/2018  Katelyn Simmons 10/23/1945 [id number]   Telephone call to patient for follow up.  CM introduced self as patient new CM. Patient is agreeable.  Patient reports good.  She reports some mild pain about 2-3 when walking to her back but states is much improved.  PT continues but last PT visit will be this week as patient meeting goals.  Patient has appointment to have stitches removed on Thursday and will see Dr. Doy Hutching on 10-20.  Patient has no problems with transportation. Patient states she has noted recent some increased urination with sometimes little output.  Advised patient on the need to contact physician as this could be beginnings of possible UTI.  She verbalized understanding.  Patient states that she was having a problem with her sugars dropping but has stopped her glimepiride and blood sugar this AM was 96.  Advised patient to continue to monitor her sugars.  She verbalized understanding.  Patient denies any other needs at this time and is appreciative of call.    Plan: RN CM will contact patient again within the month of October and patient agreeable.    [name], RN, MSN [place] Management Care Management Coordinator Direct Line [phone] Cell [phone] Toll Free: [phone]  Fax: [phone]

## 2018-11-08 DIAGNOSIS — M48062 Spinal stenosis, lumbar region with neurogenic claudication: Secondary | ICD-10-CM | POA: Diagnosis not present

## 2018-11-08 DIAGNOSIS — M4326 Fusion of spine, lumbar region: Secondary | ICD-10-CM | POA: Diagnosis not present

## 2018-11-08 DIAGNOSIS — R531 Weakness: Secondary | ICD-10-CM | POA: Diagnosis not present

## 2018-11-08 DIAGNOSIS — Z7984 Long term (current) use of oral hypoglycemic drugs: Secondary | ICD-10-CM | POA: Diagnosis not present

## 2018-11-08 DIAGNOSIS — R2689 Other abnormalities of gait and mobility: Secondary | ICD-10-CM | POA: Diagnosis not present

## 2018-11-08 DIAGNOSIS — M4316 Spondylolisthesis, lumbar region: Secondary | ICD-10-CM | POA: Diagnosis not present

## 2018-11-08 DIAGNOSIS — E119 Type 2 diabetes mellitus without complications: Secondary | ICD-10-CM | POA: Diagnosis not present

## 2018-11-14 ENCOUNTER — Ambulatory Visit: Payer: Self-pay

## 2018-11-21 ENCOUNTER — Encounter: Payer: Self-pay | Admitting: Pharmacist

## 2018-11-21 ENCOUNTER — Other Ambulatory Visit: Payer: Self-pay | Admitting: Pharmacist

## 2018-11-21 ENCOUNTER — Other Ambulatory Visit: Payer: Self-pay

## 2018-11-21 NOTE — Patient Outreach (Signed)
[facility]) Care Management  [place]   11/21/2018  Katelyn Simmons 1945/04/09 [id number]  Reason for referral: Medication Assistance with Restasis  Referral source: [facility] RN Current insurance: [employer]  PMHx includes but not limited to:  Chronic back pain, IBS-diarrhea, Barrett's esophagus without dysplasia, oropharyngeal dysphagia, hepatic steatosis, GERD, T2DM, HTN, HLD, osteoarthritis, depression / anxiety, B12 deficiency, CKD-III, hx kidney stones, postablative hypothyroidism, recent lumbar fusion surgery  Outreach:  Successful telephone call with patient.  HIPAA identifiers verified.   Subjective:  Patient reports she was able to recently fill prescription for Restasis and her vision has greatly improved since resuming medication.  She paid for it at her eye doctor's office and cost was $60.  She states she does not think it is covered by her insurance.  She also reports recent hypoglycemic events with CBG as low as 45.  She stopped taking glimepiride after this and CBGs improved but are still on the low side.  She reports she had a CBG of 78 last night and had to eat PB and a snack.  She states she has an appt with her provider on Friday and will have medications further adjusted.    Objective: The ASCVD Risk score Mikey Bussing DC Jr., et al., 2013) failed to calculate for the following reasons:   Cannot find a previous HDL lab   Cannot find a previous total cholesterol lab  Lab Results  Component Value Date   CREATININE 1.24 (H) 10/16/2018   CREATININE 1.00 03/06/2014   CREATININE 0.98 02/20/2014    Lab Results  Component Value Date   HGBA1C 5.7 (H) 10/25/2018    Lipid Panel  No results found for: CHOL, TRIG, HDL, CHOLHDL, VLDL, LDLCALC, LDLDIRECT  BP Readings from Last 3 Encounters:  10/26/18 (!) 117/48  10/16/18 (!) 130/54  03/31/16 (!) 131/59    Allergies  Allergen Reactions  . Sulfa Antibiotics Swelling    Swelling of face  . Eggs Or  Egg-Derived Products Nausea And Vomiting  . Parafon Forte Dsc [Chlorzoxazone] Other (See Comments)    Unknown reaction. Patient does not remember taking this medication  . Codeine Other (See Comments)    Jittery     Medications Reviewed Today    Reviewed by Knox Royalty, RN (Registered Nurse) on 10/27/18 at 1456  Med List Status: <None>  Medication Order Taking? Sig Documenting Provider Last Dose Status Informant  aspirin EC 81 MG tablet [id number] No Take 81 mg by mouth every evening. [provider] Past Week Unknown time Active Self  Biotin 1000 MCG tablet [id number] No Take 1,000 mcg by mouth daily. [provider] Past Week Unknown time Active Self  Blood Glucose Monitoring Suppl (GMATE SMART METER KIT) DEVI [id number] No by Does not apply route. [provider] Past Week Unknown time Active Self  Cholecalciferol (D3-1000) 1000 units capsule [id number] No Take 1,000 Units by mouth every evening.  [provider] Past Week Unknown time Active Self  cycloSPORINE (RESTASIS) 0.05 % ophthalmic emulsion [id number] No Place 1 drop into both eyes 2 (two) times daily. [provider] Past Week Unknown time Active Self  enalapril (VASOTEC) 5 MG tablet [id number] No Take 5 mg by mouth 2 (two) times daily.  [provider] 10/25/2018 0500 Active Self  fenofibrate 160 MG tablet [id number] No Take 160 mg by mouth at bedtime.  [provider] Past Week Unknown time Active Self  glimepiride (AMARYL) 4 MG tablet [id number] No Take 4 mg by  mouth daily with breakfast. [provider] Past Week Unknown time Active Self  glucose blood test strip [id number] No 1 each by Other route as needed for other. Use as instructed [provider] Past Week Unknown time Active Self  hydrochlorothiazide (HYDRODIURIL) 12.5 MG tablet [id number] No Take 12.5 mg by mouth daily. [provider] Past Week Unknown time Active Self  Lancets Misc. KIT  [id number] No by Does not apply route. [provider] Past Week Unknown time Active Self  levothyroxine (SYNTHROID) 125 MCG tablet [id number] No Take 125 mcg by mouth daily before breakfast. [provider] 10/24/2018 Unknown time Active Self  liothyronine (CYTOMEL) 5 MCG tablet [id number] No Take 5 mcg by mouth daily. [provider] 10/24/2018 Unknown time Active Self  metFORMIN (GLUCOPHAGE) 1000 MG tablet [id number] No Take 1,000 mg by mouth 2 (two) times daily with a meal. [provider] Past Week Unknown time Active Self  methocarbamol (ROBAXIN) 500 MG tablet [id number]  Take 1 tablet (500 mg total) by mouth 4 (four) times daily. Marin Olp, PA-C  Active   metoprolol succinate (TOPROL-XL) 100 MG 24 hr tablet [id number] No Take 100 mg by mouth daily. Take with or immediately following a meal. [provider] 10/25/2018 0500 Active Self  Misc Natural Products (LUTEIN 20 PO) [id number] No Take 20 mg by mouth daily. [provider] Past Week Unknown time Active Self  oxyCODONE (OXY IR/ROXICODONE) 5 MG immediate release tablet [id number]  Take 1 tablet (5 mg total) by mouth every 3 (three) hours as needed for moderate pain. Marin Olp, PA-C  Active   pantoprazole (PROTONIX) 40 MG tablet [id number] No Take 40 mg by mouth daily before breakfast.  [provider] 10/25/2018 Unknown time Active Self  pioglitazone (ACTOS) 45 MG tablet [id number] No Take 45 mg by mouth daily. [provider] 10/24/2018 Unknown time Active Self  rosuvastatin (CRESTOR) 10 MG tablet [id number] No Take 10 mg by mouth at bedtime.  [provider] Past Week Unknown time Active Self  venlafaxine XR (EFFEXOR-XR) 37.5 MG 24 hr capsule [id number] No Take 37.5 mg by mouth daily with breakfast. [provider] Past Week Unknown time Active Self  vitamin B-12 (CYANOCOBALAMIN) 1000 MCG tablet [id number] No Take 1,000 mcg by mouth daily. [provider]  Past Week Unknown time Active Self          Assessment: Medication Review Findings:  . Hypoglycemia: has appt on Friday to discuss medication management with provider, reviewed to take CBG log with her to appt and let provider know what diabetes medications she stopped taking recently (glimepiride) so medications can be further adjusted accurately.  Patient voiced understanding.    Medication Assistance Findings:  Medication assistance needs identified: Restasis  Extra Help:  Not eligible for Extra Help Low Income Subsidy based on reported income and assets  Patient Assistance Programs: Restasis made by [employer] o Income requirement met: Yes o Out-of-pocket prescription expenditure met:   Not Applicable  o Patient likely has co-pay for Restasis through [employer].  Per review of formulary, Restasis is a Tier 3 medication = ~$45 co-pay for 30 DS.  Therefore not eligible for [employer] program because she has insurance coverage.  - Patient has not met application requirements to apply for this program at this time.  - Reviewed program requirements with patient.    -Patient does not think she has current RX for Restasis as local pharmacy.  Patient has f/u with eye doctor on 11/5 and will  request new RX be called into [facility].  She cannot afford to use [employer] mail order and pay for 3 months at a time therefore prefers local pharmacy.  She thinks she will be able to afford $45 / month.    Plan: . Will f/u with patient after her next eye appt on 11/5 regarding Restasis and diabetes management  Ralene Bathe, PharmD, [place] [phone]

## 2018-11-21 NOTE — Patient Outreach (Signed)
[facility] [facility]) Care Management  [place]  11/21/2018   KEANU FRICKEY 1945/07/04 [id number]  Subjective:  Telephone call to patient for follow up.  Patient reports she is doing better.  Less pain.  Surgical area doing ok since suture removal.  Patient states husband monitors area.  Patient to see Dr. Izora Ribas on Thursday and Dr. Doy Hutching on Friday.  Discussed patient sugars, she reports some lows and will be discussing medication regimen with MD this week.  Her last sugar reports was 123.  Patient states she had some blurriness to her eye.  She states she has not been using her restasis due to cost but did get it and her eye is a lot better.  Discussed cost with patient. She state it costs about $60 for the medication and she just cannot afford it sometimes.  Discussed pharmacy referral for medication assistance. She is agreeable and appreciative.  Patient offers no other concerns.    Objective:   Encounter Medications:  Outpatient Encounter Medications as of 11/21/2018  Medication Sig  . aspirin EC 81 MG tablet Take 81 mg by mouth every evening.  . Biotin 1000 MCG tablet Take 1,000 mcg by mouth daily.  . Blood Glucose Monitoring Suppl (GMATE SMART METER KIT) DEVI by Does not apply route.  . Cholecalciferol (D3-1000) 1000 units capsule Take 1,000 Units by mouth every evening.   . cycloSPORINE (RESTASIS) 0.05 % ophthalmic emulsion Place 1 drop into both eyes 2 (two) times daily.  . enalapril (VASOTEC) 5 MG tablet Take 5 mg by mouth 2 (two) times daily.   . fenofibrate 160 MG tablet Take 160 mg by mouth at bedtime.   Marland Kitchen glimepiride (AMARYL) 4 MG tablet Take 4 mg by mouth daily with breakfast.  . glucose blood test strip 1 each by Other route as needed for other. Use as instructed  . hydrochlorothiazide (HYDRODIURIL) 12.5 MG tablet Take 12.5 mg by mouth daily.  . Lancets Misc. KIT by Does not apply route.  Marland Kitchen levothyroxine (SYNTHROID) 125 MCG tablet Take 125 mcg by mouth  daily before breakfast.  . liothyronine (CYTOMEL) 5 MCG tablet Take 5 mcg by mouth daily.  . metFORMIN (GLUCOPHAGE) 1000 MG tablet Take 1,000 mg by mouth 2 (two) times daily with a meal.  . methocarbamol (ROBAXIN) 500 MG tablet Take 1 tablet (500 mg total) by mouth 4 (four) times daily.  . metoprolol succinate (TOPROL-XL) 100 MG 24 hr tablet Take 100 mg by mouth daily. Take with or immediately following a meal.  . Misc Natural Products (LUTEIN 20 PO) Take 20 mg by mouth daily.  Marland Kitchen oxyCODONE (OXY IR/ROXICODONE) 5 MG immediate release tablet Take 1 tablet (5 mg total) by mouth every 3 (three) hours as needed for moderate pain.  . pantoprazole (PROTONIX) 40 MG tablet Take 40 mg by mouth daily before breakfast.   . pioglitazone (ACTOS) 45 MG tablet Take 45 mg by mouth daily.  . rosuvastatin (CRESTOR) 10 MG tablet Take 10 mg by mouth at bedtime.   Marland Kitchen venlafaxine XR (EFFEXOR-XR) 37.5 MG 24 hr capsule Take 37.5 mg by mouth daily with breakfast.  . vitamin B-12 (CYANOCOBALAMIN) 1000 MCG tablet Take 1,000 mcg by mouth daily.   No facility-administered encounter medications on file as of 11/21/2018.     Functional Status:  In your present state of health, do you have any difficulty performing the following activities: 11/13/2018 10/27/2018  Hearing? N -  Vision? N -  Difficulty concentrating or making decisions?  N N  Walking or climbing stairs? [name]  Comment recent back surgery due to recent back surgery 10/25/2018- lumbar lamenectomy  Dressing or bathing? N N  Doing errands, shopping? [name]  Comment husband transports Husband provides Copywriter, advertising and eating ? Y Y  Comment husband helps Husband assists  Using the Toilet? N N  In the past six months, have you accidently leaked urine? N -  Do you have problems with loss of bowel control? N -  Managing your Medications? N N  Managing your Finances? Y Y  Comment husband helps Husband assists  Housekeeping or managing your  Housekeeping? [name]  Comment husband helps Husband assists  Some recent data might be hidden    Fall/Depression Screening: Fall Risk  10/27/2018  Falls in the past year? 0  Number falls in past yr: 0  Injury with Fall? 0  Comment N/A- no falls reported today  Risk for fall due to : Impaired mobility  Risk for fall due to: Comment post-surgery concerns with mobility  Follow up Education provided;Falls prevention discussed   PHQ 2/9 Scores 11/07/2018 10/27/2018  PHQ - 2 Score 0 0    Assessment: Patient doing good post op but having some problems affording her restasis.  Plan:  [facility] CM Care Plan Problem One     Most Recent Value  Care Plan Problem One  Risk for hospital readmission related to/ as evidenced by recent surgery for lumbar laminectomy  Role Documenting the Problem One  Care Management Coordinator  Care Plan for Problem One  Active  THN Long Term Goal   Over the next 31 days, patient will not experience unplanned hospital readmission, as evidenced by patient reporting and review of EMR during [facility] RN CM outreach  [facility] Long Term Goal Start Date  10/27/18  Interventions for Problem One Long Term Goal  Discussed importance of exercise and managing pain.   THN CM Short Term Goal #1   Over the next 30 days, patient will actively participate in home health services as ordered post-hospital discharge, as evidenced by patient reporting and collaboration with home health team as indicated during [facility] RN CM outreach  [facility] CM Short Term Goal #1 Start Date  10/27/18  [facility] CM Short Term Goal #1 Met Date  11/21/18    [facility] CM Care Plan Problem Two     Most Recent Value  Care Plan Problem Two  Self-health management of chronic disease state of DM with frequent hypoglycemic episodes, as evidenced by patient reporting  Role Documenting the Problem Two  Care Management Coordinator  Care Plan for Problem Two  Active  Interventions for Problem Two Long Term Goal   Reviewed blood sugars with patient.  Patient to discuss sugars with physician this week.    THN Long Term Goal  Over the next 31 days patient will continue monitoring and recording daily blood sugars 2-4 times per day as evidenced by review of same with patient during [place] Term Goal Start Date  10/27/18     RN CM will refer to pharmacy for medication assistance. RN CM will contact patient in the month of November and patient agreeable.   [name], RN, MSN [place] Management Care Management Coordinator Direct Line [phone] Cell [phone] Toll Free: [phone]  Fax: [phone]

## 2018-11-21 NOTE — Progress Notes (Signed)
This encounter was created in error - please disregard.

## 2018-11-24 DIAGNOSIS — Z79899 Other long term (current) drug therapy: Secondary | ICD-10-CM | POA: Diagnosis not present

## 2018-11-24 DIAGNOSIS — N183 Chronic kidney disease, stage 3 unspecified: Secondary | ICD-10-CM | POA: Diagnosis not present

## 2018-11-24 DIAGNOSIS — I129 Hypertensive chronic kidney disease with stage 1 through stage 4 chronic kidney disease, or unspecified chronic kidney disease: Secondary | ICD-10-CM | POA: Diagnosis not present

## 2018-11-24 DIAGNOSIS — Z794 Long term (current) use of insulin: Secondary | ICD-10-CM | POA: Diagnosis not present

## 2018-11-24 DIAGNOSIS — E1122 Type 2 diabetes mellitus with diabetic chronic kidney disease: Secondary | ICD-10-CM | POA: Diagnosis not present

## 2018-11-24 DIAGNOSIS — E890 Postprocedural hypothyroidism: Secondary | ICD-10-CM | POA: Diagnosis not present

## 2018-11-24 DIAGNOSIS — E782 Mixed hyperlipidemia: Secondary | ICD-10-CM | POA: Diagnosis not present

## 2018-12-05 ENCOUNTER — Other Ambulatory Visit: Payer: Self-pay

## 2018-12-05 NOTE — Patient Outreach (Signed)
[facility]) Care Management  12/05/2018  MEGGEN ANKENBAUER 08/13/1945 [id number]   Telephone call to patient for follow up. Patient reports she is doing better. She denies pain but reports that when she walks that it is hard to take steps. She states this was a problem before her surgery and has still continued some. She states that she will discuss with the surgeon on her visit in December. Patient states her PCP decreased her diabetes medication and her lowest sugar was 100.  Patient checking her sugar at least 3 times a day and PRN.  Discussed continuing regimen and notify physician for any problems.  Patient she did speak with [facility] pharmacist about her eye drops and will seek to fill at outside pharmacy as recommended.  She denies any needs at this time.   Plan: RN CM will outreach patient again in the month of November and patient agreeable.   [name], RN, MSN [place] Management Care Management Coordinator Direct Line [phone] Cell [phone] Toll Free: [phone]  Fax: [phone]

## 2018-12-08 DIAGNOSIS — H18529 Epithelial (juvenile) corneal dystrophy, unspecified eye: Secondary | ICD-10-CM | POA: Diagnosis not present

## 2018-12-11 ENCOUNTER — Other Ambulatory Visit: Payer: Self-pay | Admitting: Pharmacist

## 2018-12-11 ENCOUNTER — Ambulatory Visit: Payer: Self-pay | Admitting: Pharmacist

## 2018-12-11 DIAGNOSIS — H18522 Epithelial (juvenile) corneal dystrophy, left eye: Secondary | ICD-10-CM | POA: Diagnosis not present

## 2018-12-11 DIAGNOSIS — H18529 Epithelial (juvenile) corneal dystrophy, unspecified eye: Secondary | ICD-10-CM | POA: Diagnosis not present

## 2018-12-11 NOTE — Patient Outreach (Signed)
[facility]) Care Management  [place] 12/11/2018  DARELY NOVEL [birth year] [id number]  Reason for call: f/u on Restasis / CBGs  Patient reports she has appt today with eye doctor to "clean" her eye off.  She is not sure if she will continue on Restasis after procedure but thinks it is likely.  She spoke with office re: co-pay for Restasis but did not request prescription to be sent to pharmacy yet to confirm if co-pay is $45.  Patient aware she may ask office to do this today and states she will try to do this.    Patient reports her diabetes medications have been adjusted slightly by PCP and CBGs have ranged from 100-130.  She reports she has not "bottomed out" in a long time and is very happy with current control.  She has no other diabetes questions at this time.   Plan: Will close [facility] pharmacy case.  Am happy to assist in the future as needed.   Ralene Bathe, PharmD, [place] [phone]

## 2018-12-26 ENCOUNTER — Other Ambulatory Visit: Payer: Self-pay

## 2018-12-26 NOTE — Patient Outreach (Signed)
[facility]) Care Management  [place]  12/26/2018   Katelyn Simmons [birth year] [id number]  Subjective: Telephone call to patient she reports that she is doing ok but having some muscle grabbing feeling in her back. She reports follow up appointment with neurosurgeon early next month. Patient reports she takes tylenol and that eases pain for her. Discussed back surgery and that it takes time for healing. She verbalized understanding.  She reports that her sugars are less than 130 but she did forget to eat one day and her sugar dropped to 68.  Discussed the importance of eating meals regularly in order to keep sugar from dropping. She verbalized understanding and denies any needs.    Objective:   Encounter Medications:  Outpatient Encounter Medications as of 12/26/2018  Medication Sig  . aspirin EC 81 MG tablet Take 81 mg by mouth every evening.  . Biotin 1000 MCG tablet Take 1,000 mcg by mouth daily.  . Blood Glucose Monitoring Suppl (GMATE SMART METER KIT) DEVI by Does not apply route.  . Cholecalciferol (D3-1000) 1000 units capsule Take 1,000 Units by mouth every evening.   . cycloSPORINE (RESTASIS) 0.05 % ophthalmic emulsion Place 1 drop into both eyes 2 (two) times daily.  . enalapril (VASOTEC) 5 MG tablet Take 5 mg by mouth 2 (two) times daily.   . fenofibrate 160 MG tablet Take 160 mg by mouth at bedtime.   Marland Kitchen glimepiride (AMARYL) 4 MG tablet Take 4 mg by mouth daily with breakfast.  . glucose blood test strip 1 each by Other route as needed for other. Use as instructed  . hydrochlorothiazide (HYDRODIURIL) 12.5 MG tablet Take 12.5 mg by mouth daily.  . Lancets Misc. KIT by Does not apply route.  Marland Kitchen levothyroxine (SYNTHROID) 125 MCG tablet Take 125 mcg by mouth daily before breakfast.  . liothyronine (CYTOMEL) 5 MCG tablet Take 5 mcg by mouth daily.  . metFORMIN (GLUCOPHAGE) 1000 MG tablet Take 1,000 mg by mouth 2 (two) times daily with a meal.  . metoprolol  succinate (TOPROL-XL) 100 MG 24 hr tablet Take 100 mg by mouth daily. Take with or immediately following a meal.  . Misc Natural Products (LUTEIN 20 PO) Take 20 mg by mouth daily.  . pantoprazole (PROTONIX) 40 MG tablet Take 40 mg by mouth daily before breakfast.   . pioglitazone (ACTOS) 45 MG tablet Take 45 mg by mouth daily.  . rosuvastatin (CRESTOR) 10 MG tablet Take 10 mg by mouth at bedtime.   Marland Kitchen venlafaxine XR (EFFEXOR-XR) 37.5 MG 24 hr capsule Take 37.5 mg by mouth daily with breakfast.  . vitamin B-12 (CYANOCOBALAMIN) 1000 MCG tablet Take 1,000 mcg by mouth daily.   No facility-administered encounter medications on file as of 12/26/2018.     Functional Status:  In your present state of health, do you have any difficulty performing the following activities: 11/13/2018 10/27/2018  Hearing? N -  Vision? N -  Difficulty concentrating or making decisions? N N  Walking or climbing stairs? [name]  Comment recent back surgery due to recent back surgery 10/25/2018- lumbar lamenectomy  Dressing or bathing? N N  Doing errands, shopping? [name]  Comment husband transports Husband provides Copywriter, advertising and eating ? Y Y  Comment husband helps Husband assists  Using the Toilet? N N  In the past six months, have you accidently leaked urine? N -  Do you have problems with loss of bowel control? N -  Managing your  Medications? N N  Managing your Finances? Y Y  Comment husband helps Husband assists  Housekeeping or managing your Housekeeping? [name]  Comment husband helps Husband assists  Some recent data might be hidden    Fall/Depression Screening: Fall Risk  11/21/2018 10/27/2018  Falls in the past year? 0 0  Number falls in past yr: - 0  Injury with Fall? - 0  Comment - N/A- no falls reported today  Risk for fall due to : - Impaired mobility  Risk for fall due to: Comment - post-surgery concerns with mobility  Follow up - Education provided;Falls prevention discussed   PHQ  2/9 Scores 11/07/2018 10/27/2018  PHQ - 2 Score 0 0    Assessment: Patient continues to recover from back surgery.  Patient managing chronic illnesses with no recurrent hospitalization.    Plan:  [facility] CM Care Plan Problem One     Most Recent Value  Care Plan Problem One  Self-health management of chronic disease state of DM  Role Documenting the Problem One  Care Management Telephonic Coordinator  Care Plan for Problem One  Active  THN Long Term Goal   Over the next 60 days patient will continue monitoring and recording daily blood sugars and monitoring diet as evidenced by reporting patient during [place] outreach  [place] Term Goal Start Date  12/26/18  Interventions for Problem One Long Term Goal  RN CM discussed blood sugar ranges and importance of eating balanced diet.     RN CM will contact patient in the month of December and patient agreeable.   [name], RN, MSN [place] Management Care Management Coordinator Direct Line [phone] Cell [phone] Toll Free: [phone]  Fax: [phone]

## 2018-12-27 ENCOUNTER — Ambulatory Visit: Payer: Self-pay

## 2019-01-18 DIAGNOSIS — E669 Obesity, unspecified: Secondary | ICD-10-CM | POA: Diagnosis not present

## 2019-01-18 DIAGNOSIS — E890 Postprocedural hypothyroidism: Secondary | ICD-10-CM | POA: Diagnosis not present

## 2019-01-18 DIAGNOSIS — E119 Type 2 diabetes mellitus without complications: Secondary | ICD-10-CM | POA: Diagnosis not present

## 2019-01-24 ENCOUNTER — Other Ambulatory Visit: Payer: Self-pay

## 2019-01-24 NOTE — Patient Outreach (Signed)
[facility]) Care Management  [place]  01/24/2019   Katelyn Simmons 08-Oct-1945 [id number]  Subjective: Telephone call to patient for monthly check in.  Patient reports increasing back pain.  She states she saw surgeon and was given a muscle relaxer but it only works for a short time.  Discussed taking tylenol with muscle relaxer to help.  She states that the doctor states he may need to go back in but wants to give it 3 months.  Patient continues to manage diabetes with highest sugar 166.  She states that she started to take her blood pressure medication again as her blood pressure was creeping back up.  Discussed continued monitoring of her blood pressure and reports to PCP that she did start back taking her blood pressure medication.  She verbalized understanding and declines any needs.     Objective:   Encounter Medications:  Outpatient Encounter Medications as of 01/24/2019  Medication Sig  . aspirin EC 81 MG tablet Take 81 mg by mouth every evening.  . Biotin 1000 MCG tablet Take 1,000 mcg by mouth daily.  . Blood Glucose Monitoring Suppl (GMATE SMART METER KIT) DEVI by Does not apply route.  . Cholecalciferol (D3-1000) 1000 units capsule Take 1,000 Units by mouth every evening.   . cycloSPORINE (RESTASIS) 0.05 % ophthalmic emulsion Place 1 drop into both eyes 2 (two) times daily.  . enalapril (VASOTEC) 5 MG tablet Take 5 mg by mouth 2 (two) times daily.   . fenofibrate 160 MG tablet Take 160 mg by mouth at bedtime.   Marland Kitchen glimepiride (AMARYL) 4 MG tablet Take 4 mg by mouth daily with breakfast.  . glucose blood test strip 1 each by Other route as needed for other. Use as instructed  . hydrochlorothiazide (HYDRODIURIL) 12.5 MG tablet Take 12.5 mg by mouth daily.  . Lancets Misc. KIT by Does not apply route.  Marland Kitchen levothyroxine (SYNTHROID) 125 MCG tablet Take 125 mcg by mouth daily before breakfast.  . liothyronine (CYTOMEL) 5 MCG tablet Take 5 mcg by mouth daily.   . metFORMIN (GLUCOPHAGE) 1000 MG tablet Take 1,000 mg by mouth 2 (two) times daily with a meal.  . metoprolol succinate (TOPROL-XL) 100 MG 24 hr tablet Take 100 mg by mouth daily. Take with or immediately following a meal.  . Misc Natural Products (LUTEIN 20 PO) Take 20 mg by mouth daily.  . pantoprazole (PROTONIX) 40 MG tablet Take 40 mg by mouth daily before breakfast.   . pioglitazone (ACTOS) 45 MG tablet Take 45 mg by mouth daily.  . rosuvastatin (CRESTOR) 10 MG tablet Take 10 mg by mouth at bedtime.   Marland Kitchen venlafaxine XR (EFFEXOR-XR) 37.5 MG 24 hr capsule Take 37.5 mg by mouth daily with breakfast.  . vitamin B-12 (CYANOCOBALAMIN) 1000 MCG tablet Take 1,000 mcg by mouth daily.   No facility-administered encounter medications on file as of 01/24/2019.    Functional Status:  In your present state of health, do you have any difficulty performing the following activities: 11/13/2018 10/27/2018  Hearing? N -  Vision? N -  Difficulty concentrating or making decisions? N N  Walking or climbing stairs? [name]  Comment recent back surgery due to recent back surgery 10/25/2018- lumbar lamenectomy  Dressing or bathing? N N  Doing errands, shopping? [name]  Comment husband transports Husband provides Copywriter, advertising and eating ? Y Y  Comment husband helps Husband assists  Using the Toilet? N N  In the  past six months, have you accidently leaked urine? N -  Do you have problems with loss of bowel control? N -  Managing your Medications? N N  Managing your Finances? Y Y  Comment husband helps Husband assists  Housekeeping or managing your Housekeeping? [name]  Comment husband helps Husband assists  Some recent data might be hidden    Fall/Depression Screening: Fall Risk  11/21/2018 10/27/2018  Falls in the past year? 0 0  Number falls in past yr: - 0  Injury with Fall? - 0  Comment - N/A- no falls reported today  Risk for fall due to : - Impaired mobility  Risk for fall due to:  Comment - post-surgery concerns with mobility  Follow up - Education provided;Falls prevention discussed   PHQ 2/9 Scores 11/07/2018 10/27/2018  PHQ - 2 Score 0 0    Assessment: Patient continues to manage chronic illnesses.    Plan:  [facility] CM Care Plan Problem One     Most Recent Value  Care Plan Problem One  Self-health management of chronic disease state of DM  Role Documenting the Problem One  Care Management Telephonic Coordinator  Care Plan for Problem One  Active  [facility] Long Term Goal   Over the next 60 days patient will continue monitoring and recording daily blood sugars and monitoring diet as evidenced by reporting patient during [place] outreach  [facility] Long Term Goal Start Date  01/24/19  Interventions for Problem One Long Term Goal  Reviewed blood sugars with patient and discussed diet.       RN CM will contact patient again in the month of February and patient agreeable.    [name], RN, MSN [place] Management Care Management Coordinator Direct Line [phone] Cell [phone] Toll Free: [phone]  Fax: [phone]

## 2019-01-29 DIAGNOSIS — J019 Acute sinusitis, unspecified: Secondary | ICD-10-CM | POA: Diagnosis not present

## 2019-01-29 DIAGNOSIS — Z20828 Contact with and (suspected) exposure to other viral communicable diseases: Secondary | ICD-10-CM | POA: Diagnosis not present

## 2019-02-15 DIAGNOSIS — E119 Type 2 diabetes mellitus without complications: Secondary | ICD-10-CM | POA: Diagnosis not present

## 2019-02-15 DIAGNOSIS — H2513 Age-related nuclear cataract, bilateral: Secondary | ICD-10-CM | POA: Diagnosis not present

## 2019-02-15 DIAGNOSIS — H353131 Nonexudative age-related macular degeneration, bilateral, early dry stage: Secondary | ICD-10-CM | POA: Diagnosis not present

## 2019-02-27 DIAGNOSIS — Z0100 Encounter for examination of eyes and vision without abnormal findings: Secondary | ICD-10-CM | POA: Diagnosis not present

## 2019-03-05 DIAGNOSIS — Z0100 Encounter for examination of eyes and vision without abnormal findings: Secondary | ICD-10-CM | POA: Diagnosis not present

## 2019-03-12 ENCOUNTER — Other Ambulatory Visit: Payer: Self-pay | Admitting: Internal Medicine

## 2019-03-12 DIAGNOSIS — Z1231 Encounter for screening mammogram for malignant neoplasm of breast: Secondary | ICD-10-CM

## 2019-03-12 DIAGNOSIS — E890 Postprocedural hypothyroidism: Secondary | ICD-10-CM | POA: Diagnosis not present

## 2019-03-15 ENCOUNTER — Ambulatory Visit
Admission: RE | Admit: 2019-03-15 | Discharge: 2019-03-15 | Disposition: A | Discharge status: Home / Self Care | Payer: Medicare HMO | Source: Ambulatory Visit | Attending: Internal Medicine | Admitting: Internal Medicine

## 2019-03-15 DIAGNOSIS — Z1231 Encounter for screening mammogram for malignant neoplasm of breast: Secondary | ICD-10-CM | POA: Diagnosis not present

## 2019-03-28 ENCOUNTER — Other Ambulatory Visit: Payer: Self-pay

## 2019-03-28 NOTE — Patient Outreach (Signed)
[facility]) Care Management  [place]  03/28/2019   IAN CAVEY 1945-08-19 [id number]  Subjective: Telephone call to patient. She reports that she is still having some back pain with increased activity. Patient has discussed with surgeon and there was a possibility of another surgery but patient states she does not want to do that.  For now she will deal with pain and take tylenol as needed. She states if she changes her mind she will talk with the surgeon. She reports some urinary retention since surgery.  Advised patient on importance of making appointment with PCP to discuss.  She verbalized understanding.  Patient states that her sugars are ok but had a low sugar but frequently forgets to eat.  Discussed the importance of regular meals to control sugars and avoid hypoglycemic episodes.  She verbalized understanding. She denies any problems with her blood pressure and that she is back on her previous blood pressure medications. Patient thinking about COVID vaccine and will discuss with PCP.    Objective:   Encounter Medications:  Outpatient Encounter Medications as of 03/28/2019  Medication Sig  . aspirin EC 81 MG tablet Take 81 mg by mouth every evening.  . Biotin 1000 MCG tablet Take 1,000 mcg by mouth daily.  . Blood Glucose Monitoring Suppl (GMATE SMART METER KIT) DEVI by Does not apply route.  . Cholecalciferol (D3-1000) 1000 units capsule Take 1,000 Units by mouth every evening.   . cycloSPORINE (RESTASIS) 0.05 % ophthalmic emulsion Place 1 drop into both eyes 2 (two) times daily.  . enalapril (VASOTEC) 5 MG tablet Take 5 mg by mouth 2 (two) times daily.   . fenofibrate 160 MG tablet Take 160 mg by mouth at bedtime.   Marland Kitchen glimepiride (AMARYL) 4 MG tablet Take 4 mg by mouth daily with breakfast.  . glucose blood test strip 1 each by Other route as needed for other. Use as instructed  . hydrochlorothiazide (HYDRODIURIL) 12.5 MG tablet Take 12.5 mg by mouth  daily.  . Lancets Misc. KIT by Does not apply route.  Marland Kitchen levothyroxine (SYNTHROID) 125 MCG tablet Take 125 mcg by mouth daily before breakfast.  . liothyronine (CYTOMEL) 5 MCG tablet Take 5 mcg by mouth daily.  . metFORMIN (GLUCOPHAGE) 1000 MG tablet Take 1,000 mg by mouth 2 (two) times daily with a meal.  . metoprolol succinate (TOPROL-XL) 100 MG 24 hr tablet Take 100 mg by mouth daily. Take with or immediately following a meal.  . Misc Natural Products (LUTEIN 20 PO) Take 20 mg by mouth daily.  . pantoprazole (PROTONIX) 40 MG tablet Take 40 mg by mouth daily before breakfast.   . pioglitazone (ACTOS) 45 MG tablet Take 45 mg by mouth daily.  . rosuvastatin (CRESTOR) 10 MG tablet Take 10 mg by mouth at bedtime.   Marland Kitchen venlafaxine XR (EFFEXOR-XR) 37.5 MG 24 hr capsule Take 37.5 mg by mouth daily with breakfast.  . vitamin B-12 (CYANOCOBALAMIN) 1000 MCG tablet Take 1,000 mcg by mouth daily.   No facility-administered encounter medications on file as of 03/28/2019.    Functional Status:  In your present state of health, do you have any difficulty performing the following activities: 11/13/2018 10/27/2018  Hearing? N -  Vision? N -  Difficulty concentrating or making decisions? N N  Walking or climbing stairs? [name]  Comment recent back surgery due to recent back surgery 10/25/2018- lumbar lamenectomy  Dressing or bathing? N N  Doing errands, shopping? [name]  Comment  husband transports Husband provides Copywriter, advertising and eating ? Y Y  Comment husband helps Husband assists  Using the Toilet? N N  In the past six months, have you accidently leaked urine? N -  Do you have problems with loss of bowel control? N -  Managing your Medications? N N  Managing your Finances? Y Y  Comment husband helps Husband assists  Housekeeping or managing your Housekeeping? [name]  Comment husband helps Husband assists  Some recent data might be hidden    Fall/Depression Screening: Fall Risk   11/21/2018 10/27/2018  Falls in the past year? 0 0  Number falls in past yr: - 0  Injury with Fall? - 0  Comment - N/A- no falls reported today  Risk for fall due to : - Impaired mobility  Risk for fall due to: Comment - post-surgery concerns with mobility  Follow up - Education provided;Falls prevention discussed   PHQ 2/9 Scores 11/07/2018 10/27/2018  PHQ - 2 Score 0 0    Assessment:  Patient continues to have back problems but does not want another surgery. Patient maintaining blood sugars but does not eat regularly causing hypoglycemic episodes.    Plan:  [facility] CM Care Plan Problem One     Most Recent Value  Care Plan Problem One  Self-health management of chronic disease state of DM  Role Documenting the Problem One  Care Management Telephonic Coordinator  Care Plan for Problem One  Active  [facility] Long Term Goal   Over the next 60 days patient will continue monitoring and recording daily blood sugars and monitoring diet as evidenced by reporting patient during [place] outreach  [facility] Long Term Goal Start Date  03/28/19  Interventions for Problem One Long Term Goal  Patient reviewed blood sugars.  Lowest sugar 50.  Patient ate a snack as a result. Stressed to patient the importance of regular meals to avoid hypoglycemic episodes.       RN CM will contact patient in the month of April and patient agreeable.    [name], RN, MSN [place] Management Care Management Coordinator Direct Line [phone] Cell [phone] Toll Free: [phone]  Fax: [phone]

## 2019-04-06 DIAGNOSIS — Z79899 Other long term (current) drug therapy: Secondary | ICD-10-CM | POA: Diagnosis not present

## 2019-04-06 DIAGNOSIS — E785 Hyperlipidemia, unspecified: Secondary | ICD-10-CM | POA: Diagnosis not present

## 2019-04-06 DIAGNOSIS — R0602 Shortness of breath: Secondary | ICD-10-CM | POA: Diagnosis not present

## 2019-04-06 DIAGNOSIS — E119 Type 2 diabetes mellitus without complications: Secondary | ICD-10-CM | POA: Diagnosis not present

## 2019-04-06 DIAGNOSIS — E079 Disorder of thyroid, unspecified: Secondary | ICD-10-CM | POA: Diagnosis not present

## 2019-04-06 DIAGNOSIS — Z0000 Encounter for general adult medical examination without abnormal findings: Secondary | ICD-10-CM | POA: Diagnosis not present

## 2019-04-06 DIAGNOSIS — I10 Essential (primary) hypertension: Secondary | ICD-10-CM | POA: Diagnosis not present

## 2019-04-06 DIAGNOSIS — R339 Retention of urine, unspecified: Secondary | ICD-10-CM | POA: Diagnosis not present

## 2019-04-18 DIAGNOSIS — E782 Mixed hyperlipidemia: Secondary | ICD-10-CM | POA: Diagnosis not present

## 2019-04-18 DIAGNOSIS — E890 Postprocedural hypothyroidism: Secondary | ICD-10-CM | POA: Diagnosis not present

## 2019-04-18 DIAGNOSIS — E538 Deficiency of other specified B group vitamins: Secondary | ICD-10-CM | POA: Diagnosis not present

## 2019-04-18 DIAGNOSIS — Z79899 Other long term (current) drug therapy: Secondary | ICD-10-CM | POA: Diagnosis not present

## 2019-04-18 DIAGNOSIS — E119 Type 2 diabetes mellitus without complications: Secondary | ICD-10-CM | POA: Diagnosis not present

## 2019-04-18 DIAGNOSIS — I10 Essential (primary) hypertension: Secondary | ICD-10-CM | POA: Diagnosis not present

## 2019-04-25 DIAGNOSIS — E119 Type 2 diabetes mellitus without complications: Secondary | ICD-10-CM | POA: Diagnosis not present

## 2019-04-25 DIAGNOSIS — E669 Obesity, unspecified: Secondary | ICD-10-CM | POA: Diagnosis not present

## 2019-04-25 DIAGNOSIS — E890 Postprocedural hypothyroidism: Secondary | ICD-10-CM | POA: Diagnosis not present

## 2019-05-14 NOTE — Progress Notes (Signed)
05/15/19 8:53 PM   Katelyn Simmons 1945/08/09 [id number]  Referring provider: Idelle Crouch, MD [facility] [place],  [place] [address]  Chief Complaint  Patient presents with  . Urinary Retention    HPI: Katelyn Simmons is a 74 y.o. F who presents today for the evaluation and management of urinary issues.   Visited PCP on 04/06/19 c/o urinary retention, SOB, and difficulties with losing weight. She was advised by PCP to stop Ditropan. Ditropan started by Dr. Doy Hutching.   She reports of having back surgery last November and attributes her symptoms of not being able to empty her bladder to traumatic catheter removal. Her sister (CNA) told her that they probably did not deflate the catheter.   Her symptoms primarily started after this event.  Once in a while she reports of leakage when she wakes up which she manages with pads. She reports of normal daytime frequency however worsens at night due to anxiety regarding wetting of bed.  She does have issues with mobility post op secondary to back pain.  She reports of voiding upon laughing, coughing and sneezing, chronic.  Denies any bothersome urinary issues prior to back surgery.   Denies bulging or irritation of vagina.   She also reports of no bothersome constipation or bowel symptoms. She reports of bowel movement x5 per day and recently had x14 per day.   Most recent creatinine 1.1 04/2019 (baseline) UA from 04/18/19 negative. PVR 0 mL.   PMH: Past Medical History:  Diagnosis Date  . Abnormal finding on EKG   . Anemia    pernicious anemia  . Anxiety   . Arthritis    everywhere  . B12 deficiency   . Barrett's esophagus   . CKD (chronic kidney disease)    stage III ckd  . Collagen vascular disease (HCC)    RA  . Depression   . Diabetes mellitus without complication ([place])   . Dysphagia   . Dyspnea    with exertion  . Dysrhythmia    sinus arrythmia or just stops momentarily, per patient  .  Esophageal stricture   . Fatty liver   . GERD (gastroesophageal reflux disease)   . Goiter, nontoxic, multinodular   . History of kidney stones   . Hx of repair of left rotator cuff   . Hyperlipidemia   . Hypertension   . Hypothyroidism   . Neuromuscular disorder (HCC)    fibromyalgia  . Obesity   . Pain   . Rotator cuff tear arthropathy of both shoulders   . Shingles 2008  . Stroke [facility])    mini strokes about 20 years ago.  some memory loss residual  . Throat pain     Surgical History: Past Surgical History:  Procedure Laterality Date  . ABDOMINAL HYSTERECTOMY    . CHOLECYSTECTOMY    . endoscopic carpal tunnel release Bilateral   . ESOPHAGOGASTRODUODENOSCOPY    . ESOPHAGOGASTRODUODENOSCOPY (EGD) WITH PROPOFOL N/A 03/31/2016   Procedure: ESOPHAGOGASTRODUODENOSCOPY (EGD) WITH PROPOFOL;  Surgeon: Manya Silvas, MD;  Location: [facility] ENDOSCOPY;  Service: Endoscopy;  Laterality: N/A;  . LUMBAR LAMINECTOMY/DECOMPRESSION MICRODISCECTOMY N/A 10/25/2018   Procedure: LUMBAR LAMINECTOMY/DECOMPRESSION MICRODISCECTOMY 2 LEVELS L3-5;  Surgeon: Meade Maw, MD;  Location: [facility] ORS;  Service: Neurosurgery;  Laterality: N/A;  . UPPER GI ENDOSCOPY  10/2018   light run    Home Medications:  Allergies as of 05/15/2019      Reactions   Sulfa Antibiotics Swelling  Swelling of face   Eggs Or Egg-derived Products Nausea And Vomiting   Methocarbamol Other (See Comments)   Lips felt swollen, but were not   Parafon Forte Dsc [chlorzoxazone] Other (See Comments)   Unknown reaction. Patient does not remember taking this medication   Codeine Other (See Comments)   Jittery       Medication List       Accurate as of May 15, 2019  8:53 PM. If you have any questions, ask your nurse or doctor.        aspirin EC 81 MG tablet Take 81 mg by mouth every evening.   Biotin 1000 MCG tablet Take 1,000 mcg by mouth daily.   cycloSPORINE 0.05 % ophthalmic emulsion Commonly known as:  RESTASIS Place 1 drop into both eyes 2 (two) times daily.   D3-1000 25 MCG (1000 UT) capsule Generic drug: Cholecalciferol Take 1,000 Units by mouth every evening.   enalapril 5 MG tablet Commonly known as: VASOTEC Take 5 mg by mouth 2 (two) times daily.   fenofibrate 160 MG tablet Take 160 mg by mouth at bedtime.   glimepiride 4 MG tablet Commonly known as: AMARYL Take 4 mg by mouth daily with breakfast.   glucose blood test strip 1 each by Other route as needed for other. Use as instructed   [employer] by Does not apply route.   hydrochlorothiazide 12.5 MG tablet Commonly known as: HYDRODIURIL Take 12.5 mg by mouth daily.   Lancets Misc. Kit by Does not apply route.   levocetirizine 5 MG tablet Commonly known as: XYZAL Take by mouth.   levothyroxine 75 MCG tablet Commonly known as: SYNTHROID What changed: Another medication with the same name was removed. Continue taking this medication, and follow the directions you see here. Changed by: Hollice Espy, MD   levothyroxine 100 MCG tablet Commonly known as: SYNTHROID What changed: Another medication with the same name was removed. Continue taking this medication, and follow the directions you see here. Changed by: Hollice Espy, MD   liothyronine 5 MCG tablet Commonly known as: CYTOMEL Take 5 mcg by mouth daily.   LUTEIN 20 PO Take 20 mg by mouth daily.   metFORMIN 1000 MG tablet Commonly known as: GLUCOPHAGE Take 1,000 mg by mouth 2 (two) times daily with a meal.   metoprolol succinate 100 MG 24 hr tablet Commonly known as: TOPROL-XL Take 100 mg by mouth daily. Take with or immediately following a meal.   pantoprazole 40 MG tablet Commonly known as: PROTONIX Take 40 mg by mouth daily before breakfast.   pioglitazone 45 MG tablet Commonly known as: ACTOS Take 45 mg by mouth daily.   rosuvastatin 10 MG tablet Commonly known as: CRESTOR Take 10 mg by mouth at bedtime.     venlafaxine XR 37.5 MG 24 hr capsule Commonly known as: EFFEXOR-XR Take 37.5 mg by mouth daily with breakfast.   vitamin B-12 1000 MCG tablet Commonly known as: CYANOCOBALAMIN Take 1,000 mcg by mouth daily.       Allergies:  Allergies  Allergen Reactions  . Sulfa Antibiotics Swelling    Swelling of face  . Eggs Or Egg-Derived Products Nausea And Vomiting  . Methocarbamol Other (See Comments)    Lips felt swollen, but were not  . Parafon Forte Dsc [Chlorzoxazone] Other (See Comments)    Unknown reaction. Patient does not remember taking this medication  . Codeine Other (See Comments)    Jittery     Family History:  Family History  Problem Relation Age of Onset  . Breast cancer Neg Hx     Social History:  reports that she has never smoked. She has never used smokeless tobacco. She reports current alcohol use. She reports that she does not use drugs.   Physical Exam: BP (!) 146/75   Pulse 80   Ht _0  (1.575 m)   Wt 200 lb (90.7 kg)   BMI 36.58 kg/m   Constitutional:  Alert and oriented, No acute distress. HEENT: [place] AT, moist mucus membranes.  Trachea midline, no masses. Cardiovascular: No clubbing, cyanosis, or edema. Respiratory: Normal respiratory effort, no increased work of breathing. Skin: No rashes, bruises or suspicious lesions. Neurologic: Grossly intact, no focal deficits, moving all 4 extremities. Psychiatric: Normal mood and affect.  Laboratory Data:  Urinalysis Trace leukocytes on dip however microscopic UA negative.   Pertinent Imaging: Results for orders placed or performed in visit on 05/15/19  Microscopic Examination   URINE  Result Value Ref Range   WBC, UA 0-5 0 - 5 /hpf   RBC None seen 0 - 2 /hpf   Epithelial Cells (non renal) 0-10 0 - 10 /hpf   Casts Present (A) None seen /lpf   Cast Type Hyaline casts N/A   Bacteria, UA Few None seen/Few  Urinalysis, Complete  Result Value Ref Range   Specific Gravity, UA 1.025 1.005 - 1.030    pH, UA 5.0 5.0 - 7.5   Color, UA Yellow Yellow   Appearance Ur Clear Clear   Leukocytes,UA Trace (A) Negative   Protein,UA Negative Negative/Trace   Glucose, UA Negative Negative   Ketones, UA Negative Negative   RBC, UA Negative Negative   Bilirubin, UA Negative Negative   Urobilinogen, Ur 0.2 0.2 - 1.0 mg/dL   Nitrite, UA Negative Negative   Microscopic Examination See below:   BLADDER SCAN AMB NON-IMAGING  Result Value Ref Range   Scan Result 0 ML     Assessment & Plan:    1. Stress incontinence  Discussed and recommended pelvic floor exercises  Weight loss Handout on pelvic floor exercises given   2. Urinary rentention  Concern for urinary retention post op back surgery  Adequate emptying of bladder, PVR minimal Agreed with avoiding anticholingericscand B3 agonists if she feels like she is not emptying well  Crede maneuver and timed voiding encouraged  Discussed good bowel hygiene  Return for same or next day visit for bladder scan if concerned No concern for bladder prolapse based on symptoms   Return if symptoms worsen or fail to improve.  [place] [address], [place], [place] [address] [phone]  I, Lucas Mallow, am acting as a scribe for Dr. Hollice Espy,  I have reviewed the above documentation for accuracy and completeness, and I agree with the above.   Hollice Espy, MD  I spent 45 total minutes on the day of the encounter including pre-visit review of the medical record, face-to-face time with the patient, and post visit ordering of labs/imaging/tests.

## 2019-05-15 ENCOUNTER — Other Ambulatory Visit: Payer: Self-pay

## 2019-05-15 ENCOUNTER — Ambulatory Visit: Payer: Medicare HMO | Admitting: Urology

## 2019-05-15 VITALS — BP 146/75 | HR 80 | Ht 62.0 in | Wt 200.0 lb

## 2019-05-15 DIAGNOSIS — N393 Stress incontinence (female) (male): Secondary | ICD-10-CM

## 2019-05-15 DIAGNOSIS — E118 Type 2 diabetes mellitus with unspecified complications: Secondary | ICD-10-CM | POA: Diagnosis not present

## 2019-05-15 DIAGNOSIS — R339 Retention of urine, unspecified: Secondary | ICD-10-CM | POA: Diagnosis not present

## 2019-05-15 DIAGNOSIS — I10 Essential (primary) hypertension: Secondary | ICD-10-CM | POA: Diagnosis not present

## 2019-05-15 DIAGNOSIS — Z6836 Body mass index (BMI) 36.0-36.9, adult: Secondary | ICD-10-CM | POA: Diagnosis not present

## 2019-05-15 LAB — URINALYSIS, COMPLETE
Bilirubin, UA: NEGATIVE
Glucose, UA: NEGATIVE
Ketones, UA: NEGATIVE
Nitrite, UA: NEGATIVE
Protein,UA: NEGATIVE
RBC, UA: NEGATIVE
Specific Gravity, UA: 1.025 (ref 1.005–1.030)
Urobilinogen, Ur: 0.2 mg/dL (ref 0.2–1.0)
pH, UA: 5 (ref 5.0–7.5)

## 2019-05-15 LAB — MICROSCOPIC EXAMINATION: RBC, Urine: NONE SEEN /hpf (ref 0–2)

## 2019-05-15 LAB — BLADDER SCAN AMB NON-IMAGING: Scan Result: 0

## 2019-05-15 NOTE — Patient Instructions (Signed)

## 2019-05-22 ENCOUNTER — Other Ambulatory Visit: Payer: Self-pay

## 2019-05-22 NOTE — Patient Outreach (Signed)
[place] [facility]) Care Management  05/22/2019  Katelyn Simmons 1946-01-03 [id number]   Telephone call to patient for disease management follow up.  No answer.  HIPAA compliant voice message left.    Plan: RN CM will attempt patient again in the month of June and send letter.   [name], RN, MSN [place] Management Care Management Coordinator Direct Line [phone] Cell [phone] Toll Free: [phone]  Fax: [phone]

## 2019-07-10 DIAGNOSIS — Z79899 Other long term (current) drug therapy: Secondary | ICD-10-CM | POA: Diagnosis not present

## 2019-07-10 DIAGNOSIS — I10 Essential (primary) hypertension: Secondary | ICD-10-CM | POA: Diagnosis not present

## 2019-07-10 DIAGNOSIS — E118 Type 2 diabetes mellitus with unspecified complications: Secondary | ICD-10-CM | POA: Diagnosis not present

## 2019-07-10 DIAGNOSIS — E538 Deficiency of other specified B group vitamins: Secondary | ICD-10-CM | POA: Diagnosis not present

## 2019-07-10 DIAGNOSIS — N1832 Chronic kidney disease, stage 3b: Secondary | ICD-10-CM | POA: Diagnosis not present

## 2019-07-10 DIAGNOSIS — E782 Mixed hyperlipidemia: Secondary | ICD-10-CM | POA: Diagnosis not present

## 2019-07-10 DIAGNOSIS — H9312 Tinnitus, left ear: Secondary | ICD-10-CM | POA: Diagnosis not present

## 2019-07-10 DIAGNOSIS — E890 Postprocedural hypothyroidism: Secondary | ICD-10-CM | POA: Diagnosis not present

## 2019-07-17 ENCOUNTER — Other Ambulatory Visit: Payer: Self-pay

## 2019-07-17 NOTE — Patient Outreach (Signed)
[place] [facility]) Care Management  07/17/2019  DASHANTI BURR 02/23/1945 [id number]   Telephone call to patient for disease management follow up.  No answer.  HIPAA compliant voice message left.    Plan: RN CM will attempt patient again in the month of September.  [name], RN, MSN [place] Management Care Management Coordinator Direct Line [phone] Cell [phone] Toll Free: [phone]  Fax: [phone]

## 2019-08-03 DIAGNOSIS — H9313 Tinnitus, bilateral: Secondary | ICD-10-CM | POA: Diagnosis not present

## 2019-08-03 DIAGNOSIS — R42 Dizziness and giddiness: Secondary | ICD-10-CM | POA: Diagnosis not present

## 2019-08-03 DIAGNOSIS — H6983 Other specified disorders of Eustachian tube, bilateral: Secondary | ICD-10-CM | POA: Diagnosis not present

## 2019-08-10 DIAGNOSIS — Z79899 Other long term (current) drug therapy: Secondary | ICD-10-CM | POA: Diagnosis not present

## 2019-08-10 DIAGNOSIS — M5442 Lumbago with sciatica, left side: Secondary | ICD-10-CM | POA: Diagnosis not present

## 2019-08-15 DIAGNOSIS — M545 Low back pain: Secondary | ICD-10-CM | POA: Diagnosis not present

## 2019-08-15 DIAGNOSIS — M5442 Lumbago with sciatica, left side: Secondary | ICD-10-CM | POA: Diagnosis not present

## 2019-08-15 DIAGNOSIS — R29898 Other symptoms and signs involving the musculoskeletal system: Secondary | ICD-10-CM | POA: Diagnosis not present

## 2019-08-15 DIAGNOSIS — M5441 Lumbago with sciatica, right side: Secondary | ICD-10-CM | POA: Diagnosis not present

## 2019-08-15 DIAGNOSIS — G8929 Other chronic pain: Secondary | ICD-10-CM | POA: Diagnosis not present

## 2019-08-15 DIAGNOSIS — M25552 Pain in left hip: Secondary | ICD-10-CM | POA: Diagnosis not present

## 2019-08-15 DIAGNOSIS — M25551 Pain in right hip: Secondary | ICD-10-CM | POA: Diagnosis not present

## 2019-08-15 DIAGNOSIS — M160 Bilateral primary osteoarthritis of hip: Secondary | ICD-10-CM | POA: Diagnosis not present

## 2019-08-16 ENCOUNTER — Other Ambulatory Visit: Payer: Self-pay | Admitting: Nurse Practitioner

## 2019-08-16 DIAGNOSIS — M5442 Lumbago with sciatica, left side: Secondary | ICD-10-CM

## 2019-08-16 DIAGNOSIS — R29898 Other symptoms and signs involving the musculoskeletal system: Secondary | ICD-10-CM

## 2019-08-17 DIAGNOSIS — E118 Type 2 diabetes mellitus with unspecified complications: Secondary | ICD-10-CM | POA: Diagnosis not present

## 2019-08-17 DIAGNOSIS — M5442 Lumbago with sciatica, left side: Secondary | ICD-10-CM | POA: Diagnosis not present

## 2019-08-31 ENCOUNTER — Other Ambulatory Visit: Payer: Self-pay

## 2019-08-31 ENCOUNTER — Ambulatory Visit
Admission: RE | Admit: 2019-08-31 | Discharge: 2019-08-31 | Disposition: A | Discharge status: Home / Self Care | Payer: Medicare HMO | Source: Ambulatory Visit | Attending: Nurse Practitioner | Admitting: Nurse Practitioner

## 2019-08-31 DIAGNOSIS — R29898 Other symptoms and signs involving the musculoskeletal system: Secondary | ICD-10-CM | POA: Insufficient documentation

## 2019-08-31 DIAGNOSIS — M5442 Lumbago with sciatica, left side: Secondary | ICD-10-CM | POA: Insufficient documentation

## 2019-08-31 DIAGNOSIS — M5441 Lumbago with sciatica, right side: Secondary | ICD-10-CM | POA: Diagnosis not present

## 2019-08-31 DIAGNOSIS — M47814 Spondylosis without myelopathy or radiculopathy, thoracic region: Secondary | ICD-10-CM | POA: Diagnosis not present

## 2019-08-31 DIAGNOSIS — G8929 Other chronic pain: Secondary | ICD-10-CM | POA: Diagnosis not present

## 2019-08-31 DIAGNOSIS — M5124 Other intervertebral disc displacement, thoracic region: Secondary | ICD-10-CM | POA: Diagnosis not present

## 2019-08-31 DIAGNOSIS — R531 Weakness: Secondary | ICD-10-CM | POA: Diagnosis not present

## 2019-08-31 DIAGNOSIS — M545 Low back pain: Secondary | ICD-10-CM | POA: Diagnosis not present

## 2019-09-12 ENCOUNTER — Other Ambulatory Visit: Payer: Self-pay | Admitting: Nurse Practitioner

## 2019-09-12 DIAGNOSIS — R29898 Other symptoms and signs involving the musculoskeletal system: Secondary | ICD-10-CM

## 2019-09-12 DIAGNOSIS — M5416 Radiculopathy, lumbar region: Secondary | ICD-10-CM

## 2019-09-20 DIAGNOSIS — E538 Deficiency of other specified B group vitamins: Secondary | ICD-10-CM | POA: Diagnosis not present

## 2019-09-20 DIAGNOSIS — E559 Vitamin D deficiency, unspecified: Secondary | ICD-10-CM | POA: Diagnosis not present

## 2019-09-20 DIAGNOSIS — E038 Other specified hypothyroidism: Secondary | ICD-10-CM | POA: Diagnosis not present

## 2019-09-20 DIAGNOSIS — E118 Type 2 diabetes mellitus with unspecified complications: Secondary | ICD-10-CM | POA: Diagnosis not present

## 2019-09-20 DIAGNOSIS — R270 Ataxia, unspecified: Secondary | ICD-10-CM | POA: Diagnosis not present

## 2019-09-20 DIAGNOSIS — R531 Weakness: Secondary | ICD-10-CM | POA: Diagnosis not present

## 2019-09-20 DIAGNOSIS — Z131 Encounter for screening for diabetes mellitus: Secondary | ICD-10-CM | POA: Diagnosis not present

## 2019-09-20 DIAGNOSIS — M359 Systemic involvement of connective tissue, unspecified: Secondary | ICD-10-CM | POA: Diagnosis not present

## 2019-09-20 DIAGNOSIS — E611 Iron deficiency: Secondary | ICD-10-CM | POA: Diagnosis not present

## 2019-09-20 DIAGNOSIS — E519 Thiamine deficiency, unspecified: Secondary | ICD-10-CM | POA: Diagnosis not present

## 2019-09-24 ENCOUNTER — Other Ambulatory Visit: Payer: Self-pay | Admitting: Acute Care

## 2019-09-24 DIAGNOSIS — G35 Multiple sclerosis: Secondary | ICD-10-CM

## 2019-09-25 DIAGNOSIS — E538 Deficiency of other specified B group vitamins: Secondary | ICD-10-CM | POA: Diagnosis not present

## 2019-09-27 DIAGNOSIS — M5417 Radiculopathy, lumbosacral region: Secondary | ICD-10-CM | POA: Diagnosis not present

## 2019-09-27 DIAGNOSIS — R262 Difficulty in walking, not elsewhere classified: Secondary | ICD-10-CM | POA: Diagnosis not present

## 2019-09-27 DIAGNOSIS — M25551 Pain in right hip: Secondary | ICD-10-CM | POA: Diagnosis not present

## 2019-09-27 DIAGNOSIS — M25552 Pain in left hip: Secondary | ICD-10-CM | POA: Diagnosis not present

## 2019-09-29 ENCOUNTER — Ambulatory Visit: Payer: Medicare HMO

## 2019-10-01 DIAGNOSIS — M25552 Pain in left hip: Secondary | ICD-10-CM | POA: Diagnosis not present

## 2019-10-01 DIAGNOSIS — R262 Difficulty in walking, not elsewhere classified: Secondary | ICD-10-CM | POA: Diagnosis not present

## 2019-10-01 DIAGNOSIS — M25551 Pain in right hip: Secondary | ICD-10-CM | POA: Diagnosis not present

## 2019-10-01 DIAGNOSIS — M5417 Radiculopathy, lumbosacral region: Secondary | ICD-10-CM | POA: Diagnosis not present

## 2019-10-02 DIAGNOSIS — E119 Type 2 diabetes mellitus without complications: Secondary | ICD-10-CM | POA: Diagnosis not present

## 2019-10-02 DIAGNOSIS — E890 Postprocedural hypothyroidism: Secondary | ICD-10-CM | POA: Diagnosis not present

## 2019-10-02 DIAGNOSIS — E669 Obesity, unspecified: Secondary | ICD-10-CM | POA: Diagnosis not present

## 2019-10-02 DIAGNOSIS — E538 Deficiency of other specified B group vitamins: Secondary | ICD-10-CM | POA: Diagnosis not present

## 2019-10-02 DIAGNOSIS — R29898 Other symptoms and signs involving the musculoskeletal system: Secondary | ICD-10-CM | POA: Diagnosis not present

## 2019-10-04 DIAGNOSIS — R262 Difficulty in walking, not elsewhere classified: Secondary | ICD-10-CM | POA: Diagnosis not present

## 2019-10-04 DIAGNOSIS — M25552 Pain in left hip: Secondary | ICD-10-CM | POA: Diagnosis not present

## 2019-10-04 DIAGNOSIS — M5417 Radiculopathy, lumbosacral region: Secondary | ICD-10-CM | POA: Diagnosis not present

## 2019-10-04 DIAGNOSIS — M25551 Pain in right hip: Secondary | ICD-10-CM | POA: Diagnosis not present

## 2019-10-09 DIAGNOSIS — R262 Difficulty in walking, not elsewhere classified: Secondary | ICD-10-CM | POA: Diagnosis not present

## 2019-10-09 DIAGNOSIS — M5417 Radiculopathy, lumbosacral region: Secondary | ICD-10-CM | POA: Diagnosis not present

## 2019-10-09 DIAGNOSIS — M25551 Pain in right hip: Secondary | ICD-10-CM | POA: Diagnosis not present

## 2019-10-09 DIAGNOSIS — M25552 Pain in left hip: Secondary | ICD-10-CM | POA: Diagnosis not present

## 2019-10-11 ENCOUNTER — Ambulatory Visit: Payer: Medicare HMO

## 2019-10-11 DIAGNOSIS — M25551 Pain in right hip: Secondary | ICD-10-CM | POA: Diagnosis not present

## 2019-10-11 DIAGNOSIS — R262 Difficulty in walking, not elsewhere classified: Secondary | ICD-10-CM | POA: Diagnosis not present

## 2019-10-11 DIAGNOSIS — M5417 Radiculopathy, lumbosacral region: Secondary | ICD-10-CM | POA: Diagnosis not present

## 2019-10-11 DIAGNOSIS — M25552 Pain in left hip: Secondary | ICD-10-CM | POA: Diagnosis not present

## 2019-10-15 ENCOUNTER — Other Ambulatory Visit: Payer: Self-pay

## 2019-10-15 ENCOUNTER — Ambulatory Visit
Admission: RE | Admit: 2019-10-15 | Discharge: 2019-10-15 | Disposition: A | Discharge status: Home / Self Care | Payer: Medicare HMO | Source: Ambulatory Visit | Attending: Nurse Practitioner | Admitting: Nurse Practitioner

## 2019-10-15 ENCOUNTER — Ambulatory Visit
Admission: RE | Admit: 2019-10-15 | Discharge: 2019-10-15 | Disposition: A | Discharge status: Home / Self Care | Payer: Medicare HMO | Source: Ambulatory Visit | Attending: Acute Care | Admitting: Acute Care

## 2019-10-15 DIAGNOSIS — R200 Anesthesia of skin: Secondary | ICD-10-CM | POA: Diagnosis not present

## 2019-10-15 DIAGNOSIS — R531 Weakness: Secondary | ICD-10-CM | POA: Diagnosis not present

## 2019-10-15 DIAGNOSIS — M47812 Spondylosis without myelopathy or radiculopathy, cervical region: Secondary | ICD-10-CM | POA: Diagnosis not present

## 2019-10-15 DIAGNOSIS — E782 Mixed hyperlipidemia: Secondary | ICD-10-CM | POA: Diagnosis not present

## 2019-10-15 DIAGNOSIS — M5416 Radiculopathy, lumbar region: Secondary | ICD-10-CM | POA: Insufficient documentation

## 2019-10-15 DIAGNOSIS — R29898 Other symptoms and signs involving the musculoskeletal system: Secondary | ICD-10-CM

## 2019-10-15 DIAGNOSIS — R9082 White matter disease, unspecified: Secondary | ICD-10-CM | POA: Diagnosis not present

## 2019-10-15 DIAGNOSIS — I10 Essential (primary) hypertension: Secondary | ICD-10-CM | POA: Diagnosis not present

## 2019-10-15 DIAGNOSIS — G35 Multiple sclerosis: Secondary | ICD-10-CM | POA: Insufficient documentation

## 2019-10-15 DIAGNOSIS — M5033 Other cervical disc degeneration, cervicothoracic region: Secondary | ICD-10-CM | POA: Diagnosis not present

## 2019-10-15 MED ORDER — GADOBUTROL 1 MMOL/ML IV SOLN
9.0000 mL | Freq: Once | INTRAVENOUS | Status: AC | PRN
  Administered 2019-10-15: 9 mL via INTRAVENOUS

## 2019-10-16 ENCOUNTER — Other Ambulatory Visit: Payer: Self-pay

## 2019-10-16 NOTE — Patient Outreach (Signed)
[place] [facility]) Care Management  10/16/2019  Katelyn Simmons 1945-05-01 [id number]   Telephone call to patient for disease management follow up.   No answer.  HIPAA compliant voice message left.    Plan: If no return call, RN CM will attempt patient again in the month of December.    [name], RN, MSN [place] Management Care Management Coordinator Direct Line [phone] Cell [phone] Toll Free: [phone]  Fax: [phone]

## 2019-10-22 DIAGNOSIS — M25552 Pain in left hip: Secondary | ICD-10-CM | POA: Diagnosis not present

## 2019-10-22 DIAGNOSIS — Z79899 Other long term (current) drug therapy: Secondary | ICD-10-CM | POA: Diagnosis not present

## 2019-10-22 DIAGNOSIS — R262 Difficulty in walking, not elsewhere classified: Secondary | ICD-10-CM | POA: Diagnosis not present

## 2019-10-22 DIAGNOSIS — M25551 Pain in right hip: Secondary | ICD-10-CM | POA: Diagnosis not present

## 2019-10-22 DIAGNOSIS — E118 Type 2 diabetes mellitus with unspecified complications: Secondary | ICD-10-CM | POA: Diagnosis not present

## 2019-10-22 DIAGNOSIS — E890 Postprocedural hypothyroidism: Secondary | ICD-10-CM | POA: Diagnosis not present

## 2019-10-22 DIAGNOSIS — I10 Essential (primary) hypertension: Secondary | ICD-10-CM | POA: Diagnosis not present

## 2019-10-22 DIAGNOSIS — E782 Mixed hyperlipidemia: Secondary | ICD-10-CM | POA: Diagnosis not present

## 2019-10-22 DIAGNOSIS — M5417 Radiculopathy, lumbosacral region: Secondary | ICD-10-CM | POA: Diagnosis not present

## 2019-10-25 DIAGNOSIS — I700 Atherosclerosis of aorta: Secondary | ICD-10-CM | POA: Insufficient documentation

## 2019-10-26 DIAGNOSIS — U071 COVID-19: Secondary | ICD-10-CM | POA: Diagnosis not present

## 2019-10-27 DIAGNOSIS — R29898 Other symptoms and signs involving the musculoskeletal system: Secondary | ICD-10-CM | POA: Insufficient documentation

## 2019-10-29 DIAGNOSIS — U071 COVID-19: Secondary | ICD-10-CM | POA: Diagnosis not present

## 2019-10-31 DIAGNOSIS — Z20822 Contact with and (suspected) exposure to covid-19: Secondary | ICD-10-CM | POA: Diagnosis not present

## 2019-11-28 DIAGNOSIS — E538 Deficiency of other specified B group vitamins: Secondary | ICD-10-CM | POA: Diagnosis not present

## 2019-11-28 DIAGNOSIS — R29898 Other symptoms and signs involving the musculoskeletal system: Secondary | ICD-10-CM | POA: Diagnosis not present

## 2020-01-01 DIAGNOSIS — I10 Essential (primary) hypertension: Secondary | ICD-10-CM | POA: Diagnosis not present

## 2020-01-01 DIAGNOSIS — E782 Mixed hyperlipidemia: Secondary | ICD-10-CM | POA: Diagnosis not present

## 2020-01-01 DIAGNOSIS — Z79899 Other long term (current) drug therapy: Secondary | ICD-10-CM | POA: Diagnosis not present

## 2020-01-01 DIAGNOSIS — E890 Postprocedural hypothyroidism: Secondary | ICD-10-CM | POA: Diagnosis not present

## 2020-01-01 DIAGNOSIS — E118 Type 2 diabetes mellitus with unspecified complications: Secondary | ICD-10-CM | POA: Diagnosis not present

## 2020-01-04 DIAGNOSIS — E1122 Type 2 diabetes mellitus with diabetic chronic kidney disease: Secondary | ICD-10-CM | POA: Diagnosis not present

## 2020-01-04 DIAGNOSIS — E782 Mixed hyperlipidemia: Secondary | ICD-10-CM | POA: Diagnosis not present

## 2020-01-04 DIAGNOSIS — Z1231 Encounter for screening mammogram for malignant neoplasm of breast: Secondary | ICD-10-CM | POA: Diagnosis not present

## 2020-01-04 DIAGNOSIS — Z79899 Other long term (current) drug therapy: Secondary | ICD-10-CM | POA: Diagnosis not present

## 2020-01-04 DIAGNOSIS — I129 Hypertensive chronic kidney disease with stage 1 through stage 4 chronic kidney disease, or unspecified chronic kidney disease: Secondary | ICD-10-CM | POA: Diagnosis not present

## 2020-01-04 DIAGNOSIS — N1832 Chronic kidney disease, stage 3b: Secondary | ICD-10-CM | POA: Diagnosis not present

## 2020-01-07 ENCOUNTER — Other Ambulatory Visit: Payer: Self-pay | Admitting: Internal Medicine

## 2020-01-07 DIAGNOSIS — Z1231 Encounter for screening mammogram for malignant neoplasm of breast: Secondary | ICD-10-CM

## 2020-01-15 ENCOUNTER — Other Ambulatory Visit: Payer: Self-pay

## 2020-01-15 NOTE — Patient Outreach (Signed)
[place] [facility]) Care Management  01/15/2020  Katelyn Simmons 09-23-1945 [id number]   Telephone call to patient for disease management follow up.   No answer.  Unable to leave a message.    Plan: If no return call, RN CM will attempt patient again in the month of March.  [name], RN, MSN [place] Management Care Management Coordinator Direct Line [phone] Cell [phone] Toll Free: [phone]  Fax: [phone]

## 2020-01-28 DIAGNOSIS — H16223 Keratoconjunctivitis sicca, not specified as Sjogren's, bilateral: Secondary | ICD-10-CM | POA: Diagnosis not present

## 2020-01-28 DIAGNOSIS — H16141 Punctate keratitis, right eye: Secondary | ICD-10-CM | POA: Diagnosis not present

## 2020-01-28 DIAGNOSIS — E119 Type 2 diabetes mellitus without complications: Secondary | ICD-10-CM | POA: Diagnosis not present

## 2020-02-07 DIAGNOSIS — M9911 Subluxation complex (vertebral) of cervical region: Secondary | ICD-10-CM | POA: Diagnosis not present

## 2020-02-07 DIAGNOSIS — M5136 Other intervertebral disc degeneration, lumbar region: Secondary | ICD-10-CM | POA: Diagnosis not present

## 2020-02-07 DIAGNOSIS — M9913 Subluxation complex (vertebral) of lumbar region: Secondary | ICD-10-CM | POA: Diagnosis not present

## 2020-02-07 DIAGNOSIS — M62838 Other muscle spasm: Secondary | ICD-10-CM | POA: Diagnosis not present

## 2020-02-08 DIAGNOSIS — M62838 Other muscle spasm: Secondary | ICD-10-CM | POA: Diagnosis not present

## 2020-02-08 DIAGNOSIS — M9911 Subluxation complex (vertebral) of cervical region: Secondary | ICD-10-CM | POA: Diagnosis not present

## 2020-02-08 DIAGNOSIS — M5136 Other intervertebral disc degeneration, lumbar region: Secondary | ICD-10-CM | POA: Diagnosis not present

## 2020-02-08 DIAGNOSIS — M9913 Subluxation complex (vertebral) of lumbar region: Secondary | ICD-10-CM | POA: Diagnosis not present

## 2020-02-11 DIAGNOSIS — M9913 Subluxation complex (vertebral) of lumbar region: Secondary | ICD-10-CM | POA: Diagnosis not present

## 2020-02-11 DIAGNOSIS — M62838 Other muscle spasm: Secondary | ICD-10-CM | POA: Diagnosis not present

## 2020-02-11 DIAGNOSIS — M5136 Other intervertebral disc degeneration, lumbar region: Secondary | ICD-10-CM | POA: Diagnosis not present

## 2020-02-11 DIAGNOSIS — M9911 Subluxation complex (vertebral) of cervical region: Secondary | ICD-10-CM | POA: Diagnosis not present

## 2020-02-14 DIAGNOSIS — M5136 Other intervertebral disc degeneration, lumbar region: Secondary | ICD-10-CM | POA: Diagnosis not present

## 2020-02-14 DIAGNOSIS — M9913 Subluxation complex (vertebral) of lumbar region: Secondary | ICD-10-CM | POA: Diagnosis not present

## 2020-02-14 DIAGNOSIS — M62838 Other muscle spasm: Secondary | ICD-10-CM | POA: Diagnosis not present

## 2020-02-14 DIAGNOSIS — M9911 Subluxation complex (vertebral) of cervical region: Secondary | ICD-10-CM | POA: Diagnosis not present

## 2020-02-15 DIAGNOSIS — E538 Deficiency of other specified B group vitamins: Secondary | ICD-10-CM | POA: Diagnosis not present

## 2020-02-19 DIAGNOSIS — M9911 Subluxation complex (vertebral) of cervical region: Secondary | ICD-10-CM | POA: Diagnosis not present

## 2020-02-19 DIAGNOSIS — M9913 Subluxation complex (vertebral) of lumbar region: Secondary | ICD-10-CM | POA: Diagnosis not present

## 2020-02-19 DIAGNOSIS — M5136 Other intervertebral disc degeneration, lumbar region: Secondary | ICD-10-CM | POA: Diagnosis not present

## 2020-02-19 DIAGNOSIS — M62838 Other muscle spasm: Secondary | ICD-10-CM | POA: Diagnosis not present

## 2020-02-21 DIAGNOSIS — M62838 Other muscle spasm: Secondary | ICD-10-CM | POA: Diagnosis not present

## 2020-02-21 DIAGNOSIS — M5136 Other intervertebral disc degeneration, lumbar region: Secondary | ICD-10-CM | POA: Diagnosis not present

## 2020-02-21 DIAGNOSIS — M9913 Subluxation complex (vertebral) of lumbar region: Secondary | ICD-10-CM | POA: Diagnosis not present

## 2020-02-21 DIAGNOSIS — E538 Deficiency of other specified B group vitamins: Secondary | ICD-10-CM | POA: Diagnosis not present

## 2020-02-21 DIAGNOSIS — M9911 Subluxation complex (vertebral) of cervical region: Secondary | ICD-10-CM | POA: Diagnosis not present

## 2020-02-25 DIAGNOSIS — H16141 Punctate keratitis, right eye: Secondary | ICD-10-CM | POA: Diagnosis not present

## 2020-02-25 DIAGNOSIS — H18891 Other specified disorders of cornea, right eye: Secondary | ICD-10-CM | POA: Diagnosis not present

## 2020-03-03 DIAGNOSIS — H6980 Other specified disorders of Eustachian tube, unspecified ear: Secondary | ICD-10-CM | POA: Diagnosis not present

## 2020-03-03 DIAGNOSIS — M62838 Other muscle spasm: Secondary | ICD-10-CM | POA: Diagnosis not present

## 2020-03-03 DIAGNOSIS — M9913 Subluxation complex (vertebral) of lumbar region: Secondary | ICD-10-CM | POA: Diagnosis not present

## 2020-03-03 DIAGNOSIS — M9911 Subluxation complex (vertebral) of cervical region: Secondary | ICD-10-CM | POA: Diagnosis not present

## 2020-03-03 DIAGNOSIS — H90A22 Sensorineural hearing loss, unilateral, left ear, with restricted hearing on the contralateral side: Secondary | ICD-10-CM | POA: Diagnosis not present

## 2020-03-03 DIAGNOSIS — E538 Deficiency of other specified B group vitamins: Secondary | ICD-10-CM | POA: Diagnosis not present

## 2020-03-03 DIAGNOSIS — M5136 Other intervertebral disc degeneration, lumbar region: Secondary | ICD-10-CM | POA: Diagnosis not present

## 2020-03-03 DIAGNOSIS — H9313 Tinnitus, bilateral: Secondary | ICD-10-CM | POA: Diagnosis not present

## 2020-03-06 DIAGNOSIS — M9913 Subluxation complex (vertebral) of lumbar region: Secondary | ICD-10-CM | POA: Diagnosis not present

## 2020-03-06 DIAGNOSIS — M9911 Subluxation complex (vertebral) of cervical region: Secondary | ICD-10-CM | POA: Diagnosis not present

## 2020-03-06 DIAGNOSIS — M62838 Other muscle spasm: Secondary | ICD-10-CM | POA: Diagnosis not present

## 2020-03-06 DIAGNOSIS — M5136 Other intervertebral disc degeneration, lumbar region: Secondary | ICD-10-CM | POA: Diagnosis not present

## 2020-03-11 DIAGNOSIS — M62838 Other muscle spasm: Secondary | ICD-10-CM | POA: Diagnosis not present

## 2020-03-11 DIAGNOSIS — M9911 Subluxation complex (vertebral) of cervical region: Secondary | ICD-10-CM | POA: Diagnosis not present

## 2020-03-11 DIAGNOSIS — M9913 Subluxation complex (vertebral) of lumbar region: Secondary | ICD-10-CM | POA: Diagnosis not present

## 2020-03-11 DIAGNOSIS — M5136 Other intervertebral disc degeneration, lumbar region: Secondary | ICD-10-CM | POA: Diagnosis not present

## 2020-03-11 DIAGNOSIS — E538 Deficiency of other specified B group vitamins: Secondary | ICD-10-CM | POA: Diagnosis not present

## 2020-03-13 DIAGNOSIS — M9913 Subluxation complex (vertebral) of lumbar region: Secondary | ICD-10-CM | POA: Diagnosis not present

## 2020-03-13 DIAGNOSIS — M5136 Other intervertebral disc degeneration, lumbar region: Secondary | ICD-10-CM | POA: Diagnosis not present

## 2020-03-13 DIAGNOSIS — M62838 Other muscle spasm: Secondary | ICD-10-CM | POA: Diagnosis not present

## 2020-03-13 DIAGNOSIS — M9911 Subluxation complex (vertebral) of cervical region: Secondary | ICD-10-CM | POA: Diagnosis not present

## 2020-03-17 DIAGNOSIS — M9911 Subluxation complex (vertebral) of cervical region: Secondary | ICD-10-CM | POA: Diagnosis not present

## 2020-03-17 DIAGNOSIS — M9913 Subluxation complex (vertebral) of lumbar region: Secondary | ICD-10-CM | POA: Diagnosis not present

## 2020-03-17 DIAGNOSIS — M5136 Other intervertebral disc degeneration, lumbar region: Secondary | ICD-10-CM | POA: Diagnosis not present

## 2020-03-17 DIAGNOSIS — M62838 Other muscle spasm: Secondary | ICD-10-CM | POA: Diagnosis not present

## 2020-03-20 DIAGNOSIS — M62838 Other muscle spasm: Secondary | ICD-10-CM | POA: Diagnosis not present

## 2020-03-20 DIAGNOSIS — M9911 Subluxation complex (vertebral) of cervical region: Secondary | ICD-10-CM | POA: Diagnosis not present

## 2020-03-20 DIAGNOSIS — M5136 Other intervertebral disc degeneration, lumbar region: Secondary | ICD-10-CM | POA: Diagnosis not present

## 2020-03-20 DIAGNOSIS — M9913 Subluxation complex (vertebral) of lumbar region: Secondary | ICD-10-CM | POA: Diagnosis not present

## 2020-03-24 DIAGNOSIS — M5136 Other intervertebral disc degeneration, lumbar region: Secondary | ICD-10-CM | POA: Diagnosis not present

## 2020-03-24 DIAGNOSIS — M9911 Subluxation complex (vertebral) of cervical region: Secondary | ICD-10-CM | POA: Diagnosis not present

## 2020-03-24 DIAGNOSIS — M62838 Other muscle spasm: Secondary | ICD-10-CM | POA: Diagnosis not present

## 2020-03-24 DIAGNOSIS — M9913 Subluxation complex (vertebral) of lumbar region: Secondary | ICD-10-CM | POA: Diagnosis not present

## 2020-03-27 DIAGNOSIS — M9911 Subluxation complex (vertebral) of cervical region: Secondary | ICD-10-CM | POA: Diagnosis not present

## 2020-03-27 DIAGNOSIS — M9913 Subluxation complex (vertebral) of lumbar region: Secondary | ICD-10-CM | POA: Diagnosis not present

## 2020-03-27 DIAGNOSIS — M62838 Other muscle spasm: Secondary | ICD-10-CM | POA: Diagnosis not present

## 2020-03-27 DIAGNOSIS — M5136 Other intervertebral disc degeneration, lumbar region: Secondary | ICD-10-CM | POA: Diagnosis not present

## 2020-03-28 DIAGNOSIS — H18899 Other specified disorders of cornea, unspecified eye: Secondary | ICD-10-CM | POA: Diagnosis not present

## 2020-03-28 DIAGNOSIS — H0288B Meibomian gland dysfunction left eye, upper and lower eyelids: Secondary | ICD-10-CM | POA: Diagnosis not present

## 2020-03-28 DIAGNOSIS — H16141 Punctate keratitis, right eye: Secondary | ICD-10-CM | POA: Diagnosis not present

## 2020-03-28 DIAGNOSIS — H0288A Meibomian gland dysfunction right eye, upper and lower eyelids: Secondary | ICD-10-CM | POA: Diagnosis not present

## 2020-03-31 DIAGNOSIS — M9913 Subluxation complex (vertebral) of lumbar region: Secondary | ICD-10-CM | POA: Diagnosis not present

## 2020-03-31 DIAGNOSIS — M5136 Other intervertebral disc degeneration, lumbar region: Secondary | ICD-10-CM | POA: Diagnosis not present

## 2020-03-31 DIAGNOSIS — M62838 Other muscle spasm: Secondary | ICD-10-CM | POA: Diagnosis not present

## 2020-03-31 DIAGNOSIS — M9911 Subluxation complex (vertebral) of cervical region: Secondary | ICD-10-CM | POA: Diagnosis not present

## 2020-04-01 ENCOUNTER — Other Ambulatory Visit: Payer: Self-pay

## 2020-04-01 ENCOUNTER — Ambulatory Visit
Admission: RE | Admit: 2020-04-01 | Discharge: 2020-04-01 | Disposition: A | Discharge status: Home / Self Care | Payer: Medicare HMO | Source: Ambulatory Visit | Attending: Internal Medicine | Admitting: Internal Medicine

## 2020-04-01 DIAGNOSIS — Z1231 Encounter for screening mammogram for malignant neoplasm of breast: Secondary | ICD-10-CM | POA: Diagnosis not present

## 2020-04-03 DIAGNOSIS — M62838 Other muscle spasm: Secondary | ICD-10-CM | POA: Diagnosis not present

## 2020-04-03 DIAGNOSIS — M5136 Other intervertebral disc degeneration, lumbar region: Secondary | ICD-10-CM | POA: Diagnosis not present

## 2020-04-03 DIAGNOSIS — M9913 Subluxation complex (vertebral) of lumbar region: Secondary | ICD-10-CM | POA: Diagnosis not present

## 2020-04-03 DIAGNOSIS — M9911 Subluxation complex (vertebral) of cervical region: Secondary | ICD-10-CM | POA: Diagnosis not present

## 2020-04-07 DIAGNOSIS — M9911 Subluxation complex (vertebral) of cervical region: Secondary | ICD-10-CM | POA: Diagnosis not present

## 2020-04-07 DIAGNOSIS — M9913 Subluxation complex (vertebral) of lumbar region: Secondary | ICD-10-CM | POA: Diagnosis not present

## 2020-04-07 DIAGNOSIS — M62838 Other muscle spasm: Secondary | ICD-10-CM | POA: Diagnosis not present

## 2020-04-07 DIAGNOSIS — M5136 Other intervertebral disc degeneration, lumbar region: Secondary | ICD-10-CM | POA: Diagnosis not present

## 2020-04-08 ENCOUNTER — Other Ambulatory Visit: Payer: Self-pay

## 2020-04-08 NOTE — Patient Outreach (Signed)
[place] [facility]) Care Management  04/08/2020  Katelyn Simmons [birth year] [id number]   Telephone call to patient for disease management follow up.   No answer.  HIPAA compliant voice message left.    Plan: If no return call, RN CM will attempt patient again in the month of June.  [name], RN, MSN [place] Management Care Management Coordinator Direct Line [phone] Cell [phone] Toll Free: [phone]  Fax: [phone]

## 2020-04-10 DIAGNOSIS — M9913 Subluxation complex (vertebral) of lumbar region: Secondary | ICD-10-CM | POA: Diagnosis not present

## 2020-04-10 DIAGNOSIS — M9911 Subluxation complex (vertebral) of cervical region: Secondary | ICD-10-CM | POA: Diagnosis not present

## 2020-04-10 DIAGNOSIS — M5136 Other intervertebral disc degeneration, lumbar region: Secondary | ICD-10-CM | POA: Diagnosis not present

## 2020-04-10 DIAGNOSIS — M62838 Other muscle spasm: Secondary | ICD-10-CM | POA: Diagnosis not present

## 2020-04-14 DIAGNOSIS — M9913 Subluxation complex (vertebral) of lumbar region: Secondary | ICD-10-CM | POA: Diagnosis not present

## 2020-04-14 DIAGNOSIS — M62838 Other muscle spasm: Secondary | ICD-10-CM | POA: Diagnosis not present

## 2020-04-14 DIAGNOSIS — E538 Deficiency of other specified B group vitamins: Secondary | ICD-10-CM | POA: Diagnosis not present

## 2020-04-14 DIAGNOSIS — M5136 Other intervertebral disc degeneration, lumbar region: Secondary | ICD-10-CM | POA: Diagnosis not present

## 2020-04-14 DIAGNOSIS — M9911 Subluxation complex (vertebral) of cervical region: Secondary | ICD-10-CM | POA: Diagnosis not present

## 2020-04-17 DIAGNOSIS — M9911 Subluxation complex (vertebral) of cervical region: Secondary | ICD-10-CM | POA: Diagnosis not present

## 2020-04-17 DIAGNOSIS — M9913 Subluxation complex (vertebral) of lumbar region: Secondary | ICD-10-CM | POA: Diagnosis not present

## 2020-04-17 DIAGNOSIS — M5136 Other intervertebral disc degeneration, lumbar region: Secondary | ICD-10-CM | POA: Diagnosis not present

## 2020-04-17 DIAGNOSIS — M62838 Other muscle spasm: Secondary | ICD-10-CM | POA: Diagnosis not present

## 2020-04-21 DIAGNOSIS — M5136 Other intervertebral disc degeneration, lumbar region: Secondary | ICD-10-CM | POA: Diagnosis not present

## 2020-04-21 DIAGNOSIS — M62838 Other muscle spasm: Secondary | ICD-10-CM | POA: Diagnosis not present

## 2020-04-21 DIAGNOSIS — M9911 Subluxation complex (vertebral) of cervical region: Secondary | ICD-10-CM | POA: Diagnosis not present

## 2020-04-21 DIAGNOSIS — M9913 Subluxation complex (vertebral) of lumbar region: Secondary | ICD-10-CM | POA: Diagnosis not present

## 2020-04-24 DIAGNOSIS — M62838 Other muscle spasm: Secondary | ICD-10-CM | POA: Diagnosis not present

## 2020-04-24 DIAGNOSIS — M9911 Subluxation complex (vertebral) of cervical region: Secondary | ICD-10-CM | POA: Diagnosis not present

## 2020-04-24 DIAGNOSIS — M9913 Subluxation complex (vertebral) of lumbar region: Secondary | ICD-10-CM | POA: Diagnosis not present

## 2020-04-24 DIAGNOSIS — M5136 Other intervertebral disc degeneration, lumbar region: Secondary | ICD-10-CM | POA: Diagnosis not present

## 2020-04-28 DIAGNOSIS — M5136 Other intervertebral disc degeneration, lumbar region: Secondary | ICD-10-CM | POA: Diagnosis not present

## 2020-04-28 DIAGNOSIS — M9913 Subluxation complex (vertebral) of lumbar region: Secondary | ICD-10-CM | POA: Diagnosis not present

## 2020-04-28 DIAGNOSIS — M62838 Other muscle spasm: Secondary | ICD-10-CM | POA: Diagnosis not present

## 2020-04-28 DIAGNOSIS — M9911 Subluxation complex (vertebral) of cervical region: Secondary | ICD-10-CM | POA: Diagnosis not present

## 2020-05-01 DIAGNOSIS — M62838 Other muscle spasm: Secondary | ICD-10-CM | POA: Diagnosis not present

## 2020-05-01 DIAGNOSIS — M9913 Subluxation complex (vertebral) of lumbar region: Secondary | ICD-10-CM | POA: Diagnosis not present

## 2020-05-01 DIAGNOSIS — M5136 Other intervertebral disc degeneration, lumbar region: Secondary | ICD-10-CM | POA: Diagnosis not present

## 2020-05-01 DIAGNOSIS — M9911 Subluxation complex (vertebral) of cervical region: Secondary | ICD-10-CM | POA: Diagnosis not present

## 2020-05-05 DIAGNOSIS — M9913 Subluxation complex (vertebral) of lumbar region: Secondary | ICD-10-CM | POA: Diagnosis not present

## 2020-05-05 DIAGNOSIS — M62838 Other muscle spasm: Secondary | ICD-10-CM | POA: Diagnosis not present

## 2020-05-05 DIAGNOSIS — M9911 Subluxation complex (vertebral) of cervical region: Secondary | ICD-10-CM | POA: Diagnosis not present

## 2020-05-05 DIAGNOSIS — M5136 Other intervertebral disc degeneration, lumbar region: Secondary | ICD-10-CM | POA: Diagnosis not present

## 2020-05-08 DIAGNOSIS — M9911 Subluxation complex (vertebral) of cervical region: Secondary | ICD-10-CM | POA: Diagnosis not present

## 2020-05-08 DIAGNOSIS — M9913 Subluxation complex (vertebral) of lumbar region: Secondary | ICD-10-CM | POA: Diagnosis not present

## 2020-05-08 DIAGNOSIS — M62838 Other muscle spasm: Secondary | ICD-10-CM | POA: Diagnosis not present

## 2020-05-08 DIAGNOSIS — M5136 Other intervertebral disc degeneration, lumbar region: Secondary | ICD-10-CM | POA: Diagnosis not present

## 2020-05-09 DIAGNOSIS — X32XXXA Exposure to sunlight, initial encounter: Secondary | ICD-10-CM | POA: Diagnosis not present

## 2020-05-09 DIAGNOSIS — D2262 Melanocytic nevi of left upper limb, including shoulder: Secondary | ICD-10-CM | POA: Diagnosis not present

## 2020-05-09 DIAGNOSIS — L570 Actinic keratosis: Secondary | ICD-10-CM | POA: Diagnosis not present

## 2020-05-09 DIAGNOSIS — D2271 Melanocytic nevi of right lower limb, including hip: Secondary | ICD-10-CM | POA: Diagnosis not present

## 2020-05-09 DIAGNOSIS — D2272 Melanocytic nevi of left lower limb, including hip: Secondary | ICD-10-CM | POA: Diagnosis not present

## 2020-05-09 DIAGNOSIS — D2261 Melanocytic nevi of right upper limb, including shoulder: Secondary | ICD-10-CM | POA: Diagnosis not present

## 2020-05-09 DIAGNOSIS — L821 Other seborrheic keratosis: Secondary | ICD-10-CM | POA: Diagnosis not present

## 2020-05-09 DIAGNOSIS — D225 Melanocytic nevi of trunk: Secondary | ICD-10-CM | POA: Diagnosis not present

## 2020-05-12 DIAGNOSIS — M5136 Other intervertebral disc degeneration, lumbar region: Secondary | ICD-10-CM | POA: Diagnosis not present

## 2020-05-12 DIAGNOSIS — M62838 Other muscle spasm: Secondary | ICD-10-CM | POA: Diagnosis not present

## 2020-05-12 DIAGNOSIS — M9911 Subluxation complex (vertebral) of cervical region: Secondary | ICD-10-CM | POA: Diagnosis not present

## 2020-05-12 DIAGNOSIS — M9913 Subluxation complex (vertebral) of lumbar region: Secondary | ICD-10-CM | POA: Diagnosis not present

## 2020-05-15 DIAGNOSIS — E118 Type 2 diabetes mellitus with unspecified complications: Secondary | ICD-10-CM | POA: Diagnosis not present

## 2020-05-15 DIAGNOSIS — M9913 Subluxation complex (vertebral) of lumbar region: Secondary | ICD-10-CM | POA: Diagnosis not present

## 2020-05-15 DIAGNOSIS — M5136 Other intervertebral disc degeneration, lumbar region: Secondary | ICD-10-CM | POA: Diagnosis not present

## 2020-05-15 DIAGNOSIS — M62838 Other muscle spasm: Secondary | ICD-10-CM | POA: Diagnosis not present

## 2020-05-15 DIAGNOSIS — Z79899 Other long term (current) drug therapy: Secondary | ICD-10-CM | POA: Diagnosis not present

## 2020-05-15 DIAGNOSIS — E538 Deficiency of other specified B group vitamins: Secondary | ICD-10-CM | POA: Diagnosis not present

## 2020-05-15 DIAGNOSIS — M9911 Subluxation complex (vertebral) of cervical region: Secondary | ICD-10-CM | POA: Diagnosis not present

## 2020-05-15 DIAGNOSIS — E782 Mixed hyperlipidemia: Secondary | ICD-10-CM | POA: Diagnosis not present

## 2020-05-20 DIAGNOSIS — M62838 Other muscle spasm: Secondary | ICD-10-CM | POA: Diagnosis not present

## 2020-05-20 DIAGNOSIS — M5136 Other intervertebral disc degeneration, lumbar region: Secondary | ICD-10-CM | POA: Diagnosis not present

## 2020-05-20 DIAGNOSIS — M9911 Subluxation complex (vertebral) of cervical region: Secondary | ICD-10-CM | POA: Diagnosis not present

## 2020-05-20 DIAGNOSIS — M9913 Subluxation complex (vertebral) of lumbar region: Secondary | ICD-10-CM | POA: Diagnosis not present

## 2020-05-23 DIAGNOSIS — N189 Chronic kidney disease, unspecified: Secondary | ICD-10-CM | POA: Diagnosis not present

## 2020-05-23 DIAGNOSIS — Z79899 Other long term (current) drug therapy: Secondary | ICD-10-CM | POA: Diagnosis not present

## 2020-05-23 DIAGNOSIS — E538 Deficiency of other specified B group vitamins: Secondary | ICD-10-CM | POA: Diagnosis not present

## 2020-05-23 DIAGNOSIS — Z0000 Encounter for general adult medical examination without abnormal findings: Secondary | ICD-10-CM | POA: Diagnosis not present

## 2020-05-23 DIAGNOSIS — E785 Hyperlipidemia, unspecified: Secondary | ICD-10-CM | POA: Diagnosis not present

## 2020-05-23 DIAGNOSIS — E1122 Type 2 diabetes mellitus with diabetic chronic kidney disease: Secondary | ICD-10-CM | POA: Diagnosis not present

## 2020-05-23 DIAGNOSIS — I129 Hypertensive chronic kidney disease with stage 1 through stage 4 chronic kidney disease, or unspecified chronic kidney disease: Secondary | ICD-10-CM | POA: Diagnosis not present

## 2020-06-12 DIAGNOSIS — E890 Postprocedural hypothyroidism: Secondary | ICD-10-CM | POA: Diagnosis not present

## 2020-06-12 DIAGNOSIS — K5900 Constipation, unspecified: Secondary | ICD-10-CM | POA: Diagnosis not present

## 2020-06-12 DIAGNOSIS — I10 Essential (primary) hypertension: Secondary | ICD-10-CM | POA: Diagnosis not present

## 2020-06-17 DIAGNOSIS — E538 Deficiency of other specified B group vitamins: Secondary | ICD-10-CM | POA: Diagnosis not present

## 2020-06-17 DIAGNOSIS — R29898 Other symptoms and signs involving the musculoskeletal system: Secondary | ICD-10-CM | POA: Diagnosis not present

## 2020-06-17 DIAGNOSIS — G8929 Other chronic pain: Secondary | ICD-10-CM | POA: Diagnosis not present

## 2020-06-17 DIAGNOSIS — M5441 Lumbago with sciatica, right side: Secondary | ICD-10-CM | POA: Diagnosis not present

## 2020-06-17 DIAGNOSIS — R200 Anesthesia of skin: Secondary | ICD-10-CM | POA: Diagnosis not present

## 2020-06-17 DIAGNOSIS — R202 Paresthesia of skin: Secondary | ICD-10-CM | POA: Diagnosis not present

## 2020-06-17 DIAGNOSIS — M5442 Lumbago with sciatica, left side: Secondary | ICD-10-CM | POA: Diagnosis not present

## 2020-06-27 DIAGNOSIS — Z0100 Encounter for examination of eyes and vision without abnormal findings: Secondary | ICD-10-CM | POA: Diagnosis not present

## 2020-06-27 DIAGNOSIS — H16141 Punctate keratitis, right eye: Secondary | ICD-10-CM | POA: Diagnosis not present

## 2020-06-27 DIAGNOSIS — H18891 Other specified disorders of cornea, right eye: Secondary | ICD-10-CM | POA: Diagnosis not present

## 2020-06-27 DIAGNOSIS — H0288B Meibomian gland dysfunction left eye, upper and lower eyelids: Secondary | ICD-10-CM | POA: Diagnosis not present

## 2020-06-27 DIAGNOSIS — H0288A Meibomian gland dysfunction right eye, upper and lower eyelids: Secondary | ICD-10-CM | POA: Diagnosis not present

## 2020-07-08 ENCOUNTER — Other Ambulatory Visit: Payer: Self-pay

## 2020-07-08 DIAGNOSIS — E119 Type 2 diabetes mellitus without complications: Secondary | ICD-10-CM | POA: Insufficient documentation

## 2020-07-08 DIAGNOSIS — E7800 Pure hypercholesterolemia, unspecified: Secondary | ICD-10-CM | POA: Insufficient documentation

## 2020-07-08 NOTE — Patient Outreach (Signed)
[facility]) Care Management  [place]  07/08/2020   Katelyn CLOSSER December 30, 1945 [id number]  Subjective: Telephone call to patient for disease management follow up. Patient reports she is doing ok.  CM learned that patient lost her husband but reports that she is doing ok day by day and that her son comes daily to check on her.  She reports she is managing her health good and had been getting therapy from a chiropractor but will not be continuing due to to cost and that it is not covered by insurance.  Patient last A1c 6.9.  Discussed continues diabetes management. She verbalized understanding and voices no concerns.   Objective:   Encounter Medications:  Outpatient Encounter Medications as of 07/08/2020  Medication Sig  . aspirin EC 81 MG tablet Take 81 mg by mouth every evening.  . Biotin 1000 MCG tablet Take 1,000 mcg by mouth daily.  . Blood Glucose Monitoring Suppl (GMATE SMART METER KIT) DEVI by Does not apply route.  . Cholecalciferol 25 MCG (1000 UT) capsule Take 1,000 Units by mouth every evening.   . cycloSPORINE (RESTASIS) 0.05 % ophthalmic emulsion Place 1 drop into both eyes 2 (two) times daily.  . fenofibrate 160 MG tablet Take 160 mg by mouth at bedtime.   Marland Kitchen glucose blood test strip 1 each by Other route as needed for other. Use as instructed  . hydrochlorothiazide (HYDRODIURIL) 12.5 MG tablet Take 12.5 mg by mouth daily.  . Lancets Misc. KIT by Does not apply route.  Marland Kitchen levothyroxine (SYNTHROID) 100 MCG tablet   . liothyronine (CYTOMEL) 5 MCG tablet Take 5 mcg by mouth daily.  . metFORMIN (GLUCOPHAGE) 1000 MG tablet Take 1,000 mg by mouth 2 (two) times daily with a meal.  . metoprolol succinate (TOPROL-XL) 100 MG 24 hr tablet Take 100 mg by mouth daily. Take with or immediately following a meal.  . Misc Natural Products (LUTEIN 20 PO) Take 20 mg by mouth daily.  . pantoprazole (PROTONIX) 40 MG tablet Take 40 mg by mouth daily before breakfast.   .  pioglitazone (ACTOS) 45 MG tablet Take 45 mg by mouth daily.  . rosuvastatin (CRESTOR) 10 MG tablet Take 10 mg by mouth at bedtime.   Marland Kitchen venlafaxine XR (EFFEXOR-XR) 37.5 MG 24 hr capsule Take 37.5 mg by mouth daily with breakfast.  . vitamin B-12 (CYANOCOBALAMIN) 1000 MCG tablet Take 1,000 mcg by mouth daily.  . enalapril (VASOTEC) 5 MG tablet Take 5 mg by mouth 2 (two) times daily.  (Patient not taking: Reported on 07/08/2020)  . glimepiride (AMARYL) 4 MG tablet Take 4 mg by mouth daily with breakfast. (Patient not taking: Reported on 07/08/2020)  . levocetirizine (XYZAL) 5 MG tablet Take by mouth.  . levothyroxine (SYNTHROID) 75 MCG tablet  (Patient not taking: Reported on 07/08/2020)   No facility-administered encounter medications on file as of 07/08/2020.    Functional Status:  In your present state of health, do you have any difficulty performing the following activities: 07/08/2020  Hearing? N  Vision? N  Difficulty concentrating or making decisions? N  Walking or climbing stairs? Y  Comment recent back surgery  Dressing or bathing? N  Doing errands, shopping? Y  Comment husband transports  Conservation officer, nature and eating ? N  Using the Toilet? N  In the past six months, have you accidently leaked urine? N  Do you have problems with loss of bowel control? N  Managing your Medications? N  Managing your Finances?  N  Housekeeping or managing your Housekeeping? N  Some recent data might be hidden    Fall/Depression Screening: Fall Risk  07/08/2020 03/28/2019 11/21/2018  Falls in the past year? 0 0 0  Number falls in past yr: - - -  Injury with Fall? - - -  Comment - - -  Risk for fall due to : - - -  Risk for fall due to: Comment - - -  Follow up - - -   PHQ 2/9 Scores 07/08/2020 11/07/2018 10/27/2018  PHQ - 2 Score 0 0 0    Assessment:   Care Plan Care Plan : Diabetes Type 2 (Adult)  Updates made by Jon Billings, RN since 07/08/2020 12:00 AM    Problem: Glycemic Management (Diabetes,  Type 2)     Long-Range Goal: Glycemic Management Optimized as evidenced by A1c less than 7.0   Start Date: 07/08/2020  Expected End Date: 01/31/2021  Priority: High  Note:   Evidence-based guidance:    Anticipate use of antihyperglycemic with or without insulin and periodic adjustments; consider active involvement of pharmacist.   Provide medical nutrition therapy and development of individualized eating.   Compare self-reported symptoms of hypo or hyperglycemia to blood glucose levels, diet and fluid intake, current medications, psychosocial and physiologic stressors, change in activity and barriers to care adherence.   Promote self-monitoring of blood glucose levels.   Assess and address barriers to management plan, such as food insecurity, age, developmental ability, depression, anxiety, fear of hypoglycemia or weight gain, as well as medication cost, side effects and complicated regimen.   Consider referral to community-based diabetes education program, visiting nurse, community health worker or health coach.   Encourage regular dental care for treatment of periodontal disease; refer to dental provider when needed.   Notes:    Task: Alleviate Barriers to Glycemic Management   Due Date: 01/31/2021  Priority: Routine  Responsible User: Jon Billings, RN  Note:   Care Management Activities:    - blood glucose monitoring encouraged - blood glucose readings reviewed    Notes: Patient A1c 6.9.       Goals Addressed            This Visit's Progress   . Monitor and Manage My Blood Sugar-Diabetes Type 2       Barriers: Knowledge Timeframe:  Long-Range Goal Priority:  High Start Date:   07/08/20                          Expected End Date:   01/31/21                    Follow Up Date 10/31/20   - check blood sugar at prescribed times - take the blood sugar log to all doctor visits    Why is this important?    Checking your blood sugar at home helps to keep it from  getting very high or very low.   Writing the results in a diary or log helps the doctor know how to care for you.   Your blood sugar log should have the time, date and the results.   Also, write down the amount of insulin or other medicine that you take.   Other information, like what you ate, exercise done and how you were feeling, will also be helpful.     Notes:  07/10/20  Diabetes Management Discussed: Reviewed importance of limiting carbs such as rice, potatoes,  breads, and pastas. Also discussed limiting sweets and sugary drinks.  Discussed importance of portion control.  Also discussed importance of annual exams such as mammogram and eye exam.         Plan: RN CM will follow up in September.   Follow-up: Patient agrees to Care Plan and Follow-up. Follow-up in 3 month(s)   [name], RN, MSN [facility] Care Management Coordinator Direct Line [phone] Cell [phone] Toll Free: [phone]  Fax: [phone]

## 2020-07-08 NOTE — Patient Instructions (Addendum)
Goals Addressed            This Visit's Progress   . Monitor and Manage My Blood Sugar-Diabetes Type 2       Barriers: Knowledge Timeframe:  Long-Range Goal Priority:  High Start Date:   07/08/20                          Expected End Date:   01/31/21                    Follow Up Date 10/31/20   - check blood sugar at prescribed times - take the blood sugar log to all doctor visits    Why is this important?    Checking your blood sugar at home helps to keep it from getting very high or very low.   Writing the results in a diary or log helps the doctor know how to care for you.   Your blood sugar log should have the time, date and the results.   Also, write down the amount of insulin or other medicine that you take.   Other information, like what you ate, exercise done and how you were feeling, will also be helpful.     Notes:  07/10/20  Diabetes Management Discussed: Reviewed importance of limiting carbs such as rice, potatoes, breads, and pastas. Also discussed limiting sweets and sugary drinks.  Discussed importance of portion control.  Also discussed importance of annual exams such as mammogram and eye exam.         Diabetes Mellitus Action Plan Following a diabetes action plan is a way for you to manage your diabetes (diabetes mellitus) symptoms. The plan is color-coded to help you understand what actions you need to take based on any symptoms you are having.  If you have symptoms in the red zone, you need medical care right away.  If you have symptoms in the yellow zone, you are having problems.  If you have symptoms in the green zone, you are doing well. Learning about and understanding diabetes can take time. Follow the plan that you develop with your health care provider. Know the target range for your blood sugar (glucose) level, and review your treatment plan with your health care provider at each visit. The target range for my blood sugar level is  __________________________ mg/dL. Red zone Get medical help right away if you have any of the following symptoms:  A blood sugar test result that is below 54 mg/dL (3 mmol/L).  A blood sugar test result that is at or above 240 mg/dL (13.3 mmol/L) for 2 days in a row.  Confusion or trouble thinking clearly.  Difficulty breathing.  Sickness or a fever for 2 or more days that is not getting better.  Moderate or large ketone levels in your urine.  Feeling tired or having no energy. If you have any red zone symptoms, do not wait to see if the symptoms will go away. Get medical help right away. Call your local emergency services (911 in the [place] Do not drive yourself to the hospital. If you have severely low blood sugar (severe hypoglycemia) and you cannot eat or drink, you may need glucagon. Make sure a family member or close friend knows how to check your blood sugar and how to give you glucagon. You may need to be treated in a hospital for this condition.   Yellow zone If you have any of the following symptoms,  your diabetes is not under control and you may need to make some changes:  A blood sugar test result that is at or above 240 mg/dL (13.3 mmol/L) for 2 days in a row.  Blood sugar test results that are below 70 mg/dL (3.9 mmol/L).  Other symptoms of hypoglycemia, such as: ? Shaking or feeling light-headed. ? Confusion or irritability. ? Feeling hungry. ? Having a fast heartbeat. If you have any yellow zone symptoms:  Treat your hypoglycemia by eating or drinking 15 grams of a rapid-acting carbohydrate. Follow the 15:15 rule: ? Take 15 grams of a rapid-acting carbohydrate, such as:  1 tube of glucose gel.  4 glucose pills.  4 oz (120 mL) of fruit juice.  4 oz (120 mL) of regular (not diet) soda. ? Check your blood sugar 15 minutes after you take the carbohydrate. ? If the repeat blood sugar test is still at or below 70 mg/dL (3.9 mmol/L), take 15 grams of a  carbohydrate again. ? If your blood sugar does not increase above 70 mg/dL (3.9 mmol/L) after 3 tries, get medical help right away. ? After your blood sugar returns to normal, eat a meal or a snack within 1 hour.  Keep taking your daily medicines as told by your health care provider.  Check your blood sugar more often than you normally would. ? Write down your results. ? Call your health care provider if you have trouble keeping your blood sugar in your target range.   Green zone These signs mean you are doing well and you can continue what you are doing to manage your diabetes:  Your blood sugar is within your personal target range. For most people, a blood sugar level before a meal (preprandial) should be 80-130 mg/dL (4.4-7.2 mmol/L).  You feel well, and you are able to do daily activities. If you are in the green zone, continue to manage your diabetes as told by your health care provider. To do this:  Eat a healthy diet.  Exercise regularly.  Check your blood sugar as told by your health care provider.  Take your medicines as told by your health care provider.   Where to find more information  American Diabetes Association (ADA): diabetes.org  Association of Diabetes Care & Education Specialists (ADCES): diabeteseducator.org Summary  Following a diabetes action plan is a way for you to manage your diabetes symptoms. The plan is color-coded to help you understand what actions you need to take based on any symptoms you are having.  Follow the plan that you develop with your health care provider. Make sure you know your personal target blood sugar level.  Review your treatment plan with your health care provider at each visit. This information is not intended to replace advice given to you by your health care provider. Make sure you discuss any questions you have with your health care provider. Document Revised: 07/26/2019 Document Reviewed: 07/26/2019 Elsevier Patient  Education  [place].

## 2020-07-24 DIAGNOSIS — E538 Deficiency of other specified B group vitamins: Secondary | ICD-10-CM | POA: Diagnosis not present

## 2020-08-29 DIAGNOSIS — E118 Type 2 diabetes mellitus with unspecified complications: Secondary | ICD-10-CM | POA: Diagnosis not present

## 2020-08-29 DIAGNOSIS — E538 Deficiency of other specified B group vitamins: Secondary | ICD-10-CM | POA: Diagnosis not present

## 2020-08-29 DIAGNOSIS — Z79899 Other long term (current) drug therapy: Secondary | ICD-10-CM | POA: Diagnosis not present

## 2020-08-29 DIAGNOSIS — E890 Postprocedural hypothyroidism: Secondary | ICD-10-CM | POA: Diagnosis not present

## 2020-09-09 DIAGNOSIS — Z23 Encounter for immunization: Secondary | ICD-10-CM | POA: Diagnosis not present

## 2020-09-09 DIAGNOSIS — L0231 Cutaneous abscess of buttock: Secondary | ICD-10-CM | POA: Diagnosis not present

## 2020-09-23 DIAGNOSIS — R531 Weakness: Secondary | ICD-10-CM | POA: Diagnosis not present

## 2020-09-23 DIAGNOSIS — I10 Essential (primary) hypertension: Secondary | ICD-10-CM | POA: Diagnosis not present

## 2020-09-23 DIAGNOSIS — E119 Type 2 diabetes mellitus without complications: Secondary | ICD-10-CM | POA: Diagnosis not present

## 2020-09-23 DIAGNOSIS — E890 Postprocedural hypothyroidism: Secondary | ICD-10-CM | POA: Diagnosis not present

## 2020-09-23 DIAGNOSIS — E782 Mixed hyperlipidemia: Secondary | ICD-10-CM | POA: Diagnosis not present

## 2020-10-07 ENCOUNTER — Other Ambulatory Visit: Payer: Self-pay

## 2020-10-07 NOTE — Patient Outreach (Signed)
[facility]) Care Management  [place]  10/07/2020   Katelyn Simmons Mar 04, 1945 [id number]  Subjective: Telephone call to patient for disease management. Continues to adjust to spouse being gone.  Discussed health management and diabetes management. She verbalized understanding.   Objective:   Encounter Medications:  Outpatient Encounter Medications as of 10/07/2020  Medication Sig   aspirin EC 81 MG tablet Take 81 mg by mouth every evening.   Biotin 1000 MCG tablet Take 1,000 mcg by mouth daily.   Blood Glucose Monitoring Suppl (GMATE SMART METER KIT) DEVI by Does not apply route.   Cholecalciferol 25 MCG (1000 UT) capsule Take 1,000 Units by mouth every evening.    cycloSPORINE (RESTASIS) 0.05 % ophthalmic emulsion Place 1 drop into both eyes 2 (two) times daily.   enalapril (VASOTEC) 5 MG tablet Take 5 mg by mouth 2 (two) times daily.  (Patient not taking: Reported on 07/08/2020)   fenofibrate 160 MG tablet Take 160 mg by mouth at bedtime.    glimepiride (AMARYL) 4 MG tablet Take 4 mg by mouth daily with breakfast. (Patient not taking: Reported on 07/08/2020)   glucose blood test strip 1 each by Other route as needed for other. Use as instructed   hydrochlorothiazide (HYDRODIURIL) 12.5 MG tablet Take 12.5 mg by mouth daily.   Lancets Misc. KIT by Does not apply route.   levocetirizine (XYZAL) 5 MG tablet Take by mouth.   levothyroxine (SYNTHROID) 100 MCG tablet    levothyroxine (SYNTHROID) 75 MCG tablet  (Patient not taking: Reported on 07/08/2020)   liothyronine (CYTOMEL) 5 MCG tablet Take 5 mcg by mouth daily.   metFORMIN (GLUCOPHAGE) 1000 MG tablet Take 1,000 mg by mouth 2 (two) times daily with a meal.   metoprolol succinate (TOPROL-XL) 100 MG 24 hr tablet Take 100 mg by mouth daily. Take with or immediately following a meal.   Misc Natural Products (LUTEIN 20 PO) Take 20 mg by mouth daily.   pantoprazole (PROTONIX) 40 MG tablet Take 40 mg by mouth daily before  breakfast.    pioglitazone (ACTOS) 45 MG tablet Take 45 mg by mouth daily.   rosuvastatin (CRESTOR) 10 MG tablet Take 10 mg by mouth at bedtime.    venlafaxine XR (EFFEXOR-XR) 37.5 MG 24 hr capsule Take 37.5 mg by mouth daily with breakfast.   vitamin B-12 (CYANOCOBALAMIN) 1000 MCG tablet Take 1,000 mcg by mouth daily.   No facility-administered encounter medications on file as of 10/07/2020.    Functional Status:  In your present state of health, do you have any difficulty performing the following activities: 07/08/2020  Hearing? N  Vision? N  Difficulty concentrating or making decisions? N  Walking or climbing stairs? Y  Comment recent back surgery  Dressing or bathing? N  Doing errands, shopping? Y  Comment husband transports  Conservation officer, nature and eating ? N  Using the Toilet? N  In the past six months, have you accidently leaked urine? N  Do you have problems with loss of bowel control? N  Managing your Medications? N  Managing your Finances? N  Housekeeping or managing your Housekeeping? N  Some recent data might be hidden    Fall/Depression Screening: Fall Risk  07/08/2020 03/28/2019 11/21/2018  Falls in the past year? 0 0 0  Number falls in past yr: - - -  Injury with Fall? - - -  Comment - - -  Risk for fall due to : - - -  Risk for fall  due to: Comment - - -  Follow up - - -   PHQ 2/9 Scores 07/08/2020 11/07/2018 10/27/2018  PHQ - 2 Score 0 0 0    Assessment:   Care Plan Care Plan : Diabetes Type 2 (Adult)  Updates made by Katelyn Billings, RN since 10/07/2020 12:00 AM     Problem: Glycemic Management (Diabetes, Type 2)      Long-Range Goal: Glycemic Management Optimized as evidenced by A1c less than 7.0   Start Date: 07/08/2020  Expected End Date: 01/31/2021  This Visit's Progress: On track  Priority: High  Note:   Evidence-based guidance:   Anticipate use of antihyperglycemic with or without insulin and periodic adjustments; consider active involvement of  pharmacist.  Provide medical nutrition therapy and development of individualized eating.  Compare self-reported symptoms of hypo or hyperglycemia to blood glucose levels, diet and fluid intake, current medications, psychosocial and physiologic stressors, change in activity and barriers to care adherence.  Promote self-monitoring of blood glucose levels.  Assess and address barriers to management plan, such as food insecurity, age, developmental ability, depression, anxiety, fear of hypoglycemia or weight gain, as well as medication cost, side effects and complicated regimen.  Consider referral to community-based diabetes education program, visiting nurse, community health worker or health coach.  Encourage regular dental care for treatment of periodontal disease; refer to dental provider when needed.   Notes:     Task: Alleviate Barriers to Glycemic Management   Due Date: 01/31/2021  Priority: Routine  Responsible User: Katelyn Billings, RN  Note:   Care Management Activities:    - blood glucose monitoring encouraged - blood glucose readings reviewed    Notes: Patient A1c 6.9.   10/07/20 Blood sugar around 120.   Diabetes Management Discussed: Medication adherence Reviewed importance of limiting carbs such as rice, potatoes, breads, and pastas. Also discussed limiting sweets and sugary drinks.  Discussed importance of portion control.  Also discussed importance of annual exams, foot exams, and eye exams.        Goals Addressed               This Visit's Progress     Monitor and Manage My Blood Sugar-Diabetes Type 2 (pt-stated)   On track     Barriers: Knowledge Timeframe:  Long-Range Goal Priority:  High Start Date:   07/08/20                          Expected End Date:   01/31/21                    Follow Up Date 12/31/20   - check blood sugar at prescribed times - take the blood sugar log to all doctor visits    Why is this important?   Checking your blood sugar at home  helps to keep it from getting very high or very low.  Writing the results in a diary or log helps the doctor know how to care for you.  Your blood sugar log should have the time, date and the results.  Also, write down the amount of insulin or other medicine that you take.  Other information, like what you ate, exercise done and how you were feeling, will also be helpful.     Notes:  07/10/20  Diabetes Management Discussed: Reviewed importance of limiting carbs such as rice, potatoes, breads, and pastas. Also discussed limiting sweets and sugary drinks.  Discussed importance of  portion control.  Also discussed importance of annual exams such as mammogram and eye exam.   10/07/20 Patient blood sugar around 120.  Reviewed diabetes management.          Plan:  Follow-up: Patient agrees to Care Plan and Follow-up. Follow-up in 2 month(s)  [name], RN, MSN [place] [facility] Care Management Coordinator Direct Line [phone] Cell [phone] Toll Free: [phone]  Fax: [phone]

## 2020-11-04 DIAGNOSIS — M79641 Pain in right hand: Secondary | ICD-10-CM | POA: Diagnosis not present

## 2020-11-04 DIAGNOSIS — E1122 Type 2 diabetes mellitus with diabetic chronic kidney disease: Secondary | ICD-10-CM | POA: Diagnosis not present

## 2020-11-04 DIAGNOSIS — Z79899 Other long term (current) drug therapy: Secondary | ICD-10-CM | POA: Diagnosis not present

## 2020-11-04 DIAGNOSIS — I129 Hypertensive chronic kidney disease with stage 1 through stage 4 chronic kidney disease, or unspecified chronic kidney disease: Secondary | ICD-10-CM | POA: Diagnosis not present

## 2020-11-04 DIAGNOSIS — E782 Mixed hyperlipidemia: Secondary | ICD-10-CM | POA: Diagnosis not present

## 2020-11-04 DIAGNOSIS — E890 Postprocedural hypothyroidism: Secondary | ICD-10-CM | POA: Diagnosis not present

## 2020-11-04 DIAGNOSIS — N183 Chronic kidney disease, stage 3 unspecified: Secondary | ICD-10-CM | POA: Diagnosis not present

## 2020-11-04 DIAGNOSIS — E538 Deficiency of other specified B group vitamins: Secondary | ICD-10-CM | POA: Diagnosis not present

## 2020-11-04 DIAGNOSIS — M7989 Other specified soft tissue disorders: Secondary | ICD-10-CM | POA: Diagnosis not present

## 2020-11-05 ENCOUNTER — Other Ambulatory Visit (HOSPITAL_COMMUNITY): Payer: Self-pay | Admitting: Internal Medicine

## 2020-11-05 ENCOUNTER — Other Ambulatory Visit: Payer: Self-pay | Admitting: Internal Medicine

## 2020-11-05 DIAGNOSIS — M7989 Other specified soft tissue disorders: Secondary | ICD-10-CM

## 2020-11-06 ENCOUNTER — Ambulatory Visit
Admission: RE | Admit: 2020-11-06 | Discharge: 2020-11-06 | Disposition: A | Discharge status: Home / Self Care | Payer: Medicare HMO | Source: Ambulatory Visit | Attending: Internal Medicine | Admitting: Internal Medicine

## 2020-11-06 ENCOUNTER — Other Ambulatory Visit: Payer: Self-pay

## 2020-11-06 DIAGNOSIS — M7989 Other specified soft tissue disorders: Secondary | ICD-10-CM | POA: Insufficient documentation

## 2020-11-06 DIAGNOSIS — M79605 Pain in left leg: Secondary | ICD-10-CM | POA: Diagnosis not present

## 2020-11-06 DIAGNOSIS — R600 Localized edema: Secondary | ICD-10-CM | POA: Diagnosis not present

## 2020-11-19 DIAGNOSIS — M65312 Trigger thumb, left thumb: Secondary | ICD-10-CM | POA: Diagnosis not present

## 2020-11-19 DIAGNOSIS — M65311 Trigger thumb, right thumb: Secondary | ICD-10-CM | POA: Diagnosis not present

## 2020-11-19 DIAGNOSIS — M65332 Trigger finger, left middle finger: Secondary | ICD-10-CM | POA: Diagnosis not present

## 2020-11-26 DIAGNOSIS — M65312 Trigger thumb, left thumb: Secondary | ICD-10-CM | POA: Diagnosis not present

## 2020-11-26 DIAGNOSIS — M65311 Trigger thumb, right thumb: Secondary | ICD-10-CM | POA: Diagnosis not present

## 2020-12-10 ENCOUNTER — Other Ambulatory Visit: Payer: Self-pay

## 2020-12-10 NOTE — Patient Outreach (Signed)
[facility]) Care Management  [place]  12/10/2020   Katelyn Simmons 1945-06-07 [id number]  Subjective: Telephone call to patient for disease management follow up. Patient doing ok.  Blood sugar management continues with sugars around 115.    Objective:   Encounter Medications:  Outpatient Encounter Medications as of 12/10/2020  Medication Sig   aspirin EC 81 MG tablet Take 81 mg by mouth every evening.   Biotin 1000 MCG tablet Take 1,000 mcg by mouth daily.   Blood Glucose Monitoring Suppl (GMATE SMART METER KIT) DEVI by Does not apply route.   Cholecalciferol 25 MCG (1000 UT) capsule Take 1,000 Units by mouth every evening.    cycloSPORINE (RESTASIS) 0.05 % ophthalmic emulsion Place 1 drop into both eyes 2 (two) times daily.   enalapril (VASOTEC) 5 MG tablet Take 5 mg by mouth 2 (two) times daily.  (Patient not taking: Reported on 07/08/2020)   fenofibrate 160 MG tablet Take 160 mg by mouth at bedtime.    glimepiride (AMARYL) 4 MG tablet Take 4 mg by mouth daily with breakfast. (Patient not taking: Reported on 07/08/2020)   glucose blood test strip 1 each by Other route as needed for other. Use as instructed   hydrochlorothiazide (HYDRODIURIL) 12.5 MG tablet Take 12.5 mg by mouth daily.   Lancets Misc. KIT by Does not apply route.   levocetirizine (XYZAL) 5 MG tablet Take by mouth.   levothyroxine (SYNTHROID) 100 MCG tablet    levothyroxine (SYNTHROID) 75 MCG tablet  (Patient not taking: Reported on 07/08/2020)   liothyronine (CYTOMEL) 5 MCG tablet Take 5 mcg by mouth daily.   metFORMIN (GLUCOPHAGE) 1000 MG tablet Take 1,000 mg by mouth 2 (two) times daily with a meal.   metoprolol succinate (TOPROL-XL) 100 MG 24 hr tablet Take 100 mg by mouth daily. Take with or immediately following a meal.   Misc Natural Products (LUTEIN 20 PO) Take 20 mg by mouth daily.   pantoprazole (PROTONIX) 40 MG tablet Take 40 mg by mouth daily before breakfast.    pioglitazone  (ACTOS) 45 MG tablet Take 45 mg by mouth daily.   rosuvastatin (CRESTOR) 10 MG tablet Take 10 mg by mouth at bedtime.    venlafaxine XR (EFFEXOR-XR) 37.5 MG 24 hr capsule Take 37.5 mg by mouth daily with breakfast.   vitamin B-12 (CYANOCOBALAMIN) 1000 MCG tablet Take 1,000 mcg by mouth daily.   No facility-administered encounter medications on file as of 12/10/2020.    Functional Status:  In your present state of health, do you have any difficulty performing the following activities: 07/08/2020  Hearing? N  Vision? N  Difficulty concentrating or making decisions? N  Walking or climbing stairs? Y  Comment recent back surgery  Dressing or bathing? N  Doing errands, shopping? Y  Comment husband transports  Conservation officer, nature and eating ? N  Using the Toilet? N  In the past six months, have you accidently leaked urine? N  Do you have problems with loss of bowel control? N  Managing your Medications? N  Managing your Finances? N  Housekeeping or managing your Housekeeping? N  Some recent data might be hidden    Fall/Depression Screening: Fall Risk  07/08/2020 03/28/2019 11/21/2018  Falls in the past year? 0 0 0  Number falls in past yr: - - -  Injury with Fall? - - -  Comment - - -  Risk for fall due to : - - -  Risk for fall due to:  Comment - - -  Follow up - - -   PHQ 2/9 Scores 07/08/2020 11/07/2018 10/27/2018  PHQ - 2 Score 0 0 0    Assessment:   Care Plan Care Plan : Diabetes Type 2 (Adult)  Updates made by Jon Billings, RN since 12/10/2020 12:00 AM  Completed 12/10/2020   Problem: Glycemic Management (Diabetes, Type 2) Resolved 12/10/2020     Long-Range Goal: Glycemic Management Optimized as evidenced by A1c less than 7.0 Completed 12/10/2020  Start Date: 07/08/2020  Expected End Date: 01/31/2021  Recent Progress: On track  Priority: High  Note:   Evidence-based guidance:   Anticipate use of antihyperglycemic with or without insulin and periodic adjustments; consider active  involvement of pharmacist.  Provide medical nutrition therapy and development of individualized eating.  Compare self-reported symptoms of hypo or hyperglycemia to blood glucose levels, diet and fluid intake, current medications, psychosocial and physiologic stressors, change in activity and barriers to care adherence.  Promote self-monitoring of blood glucose levels.  Assess and address barriers to management plan, such as food insecurity, age, developmental ability, depression, anxiety, fear of hypoglycemia or weight gain, as well as medication cost, side effects and complicated regimen.  Consider referral to community-based diabetes education program, visiting nurse, community health worker or health coach.  Encourage regular dental care for treatment of periodontal disease; refer to dental provider when needed.   Notes: 24/4/62 Resolving duplicate goal    Task: Alleviate Barriers to Glycemic Management Completed 12/10/2020  Due Date: 01/31/2021  Priority: Routine  Responsible User: Jon Billings, RN  Note:   Care Management Activities:    - blood glucose monitoring encouraged - blood glucose readings reviewed    Notes: Patient A1c 6.9.   10/07/20 Blood sugar around 120.   Diabetes Management Discussed: Medication adherence Reviewed importance of limiting carbs such as rice, potatoes, breads, and pastas. Also discussed limiting sweets and sugary drinks.  Discussed importance of portion control.  Also discussed importance of annual exams, foot exams, and eye exams.      Care Plan : RN Care Manager Plan of Care  Updates made by Jon Billings, RN since 12/10/2020 12:00 AM     Problem: Chronic Disease Management and Care Coordination Needs Diabetes   Priority: High     Long-Range Goal: Development of Plan of Care for management of diabetes   Start Date: 12/10/2020  Expected End Date: 12/31/2021  Priority: High  Note:   Current Barriers:  Chronic Disease Management support and  education needs related to DMII  RNCM Clinical Goal(s):  Patient will verbalize understanding of plan for management of DMII take all medications exactly as prescribed and will call provider for medication related questions through collaboration with RN Care manager, provider, and care team.   Interventions: Education in management of diabetes Inter-disciplinary care team collaboration (see longitudinal plan of care) Evaluation of current treatment plan related to  self management and patient's adherence to plan as established by provider   Diabetes Interventions: Assessed patient's understanding of A1c goal: <7% Provided education to patient about basic DM disease process; Reviewed medications with patient and discussed importance of medication adherence; Discussed plans with patient for ongoing care management follow up and provided patient with direct contact information for care management team; Lab Results  Component Value Date   HGBA1C 5.7 (H) 10/25/2018   Patient Goals/Self-Care Activities: Patient will self administer medications as prescribed Patient will attend all scheduled provider appointments Reviewed importance of limiting carbs such as rice, potatoes,  breads, and pasta Patient will follow low carbohydrate diet Patient will check blood sugars as prescribed  Follow Up Plan:  Telephone follow up appointment with care management team member scheduled for:  2 months The patient has been provided with contact information for the care management team and has been advised to call with any health related questions or concerns.        Goals Addressed               This Visit's Progress     COMPLETED: Monitor and Manage My Blood Sugar-Diabetes Type 2 (pt-stated)   On track     Barriers: Knowledge Timeframe:  Long-Range Goal Priority:  High Start Date:   07/08/20                          Expected End Date:   01/31/21                    Follow Up Date 12/31/20   -  check blood sugar at prescribed times - take the blood sugar log to all doctor visits    Why is this important?   Checking your blood sugar at home helps to keep it from getting very high or very low.  Writing the results in a diary or log helps the doctor know how to care for you.  Your blood sugar log should have the time, date and the results.  Also, write down the amount of insulin or other medicine that you take.  Other information, like what you ate, exercise done and how you were feeling, will also be helpful.     Notes:  07/10/20  Diabetes Management Discussed: Reviewed importance of limiting carbs such as rice, potatoes, breads, and pastas. Also discussed limiting sweets and sugary drinks.  Discussed importance of portion control.  Also discussed importance of annual exams such as mammogram and eye exam.   10/07/20 Patient blood sugar around 120.  Reviewed diabetes management.   78/6/75 Resolving,duplicate goal.         Plan:  Follow-up: Patient agrees to Care Plan and Follow-up. Follow-up in 2 month(s)  [name], RN, MSN [place] Management Care Management Coordinator Direct Line [phone] Cell [phone] Toll Free: [phone]  Fax: [phone]

## 2020-12-16 DIAGNOSIS — R202 Paresthesia of skin: Secondary | ICD-10-CM | POA: Diagnosis not present

## 2020-12-16 DIAGNOSIS — G8929 Other chronic pain: Secondary | ICD-10-CM | POA: Diagnosis not present

## 2020-12-16 DIAGNOSIS — R2689 Other abnormalities of gait and mobility: Secondary | ICD-10-CM | POA: Diagnosis not present

## 2020-12-16 DIAGNOSIS — R531 Weakness: Secondary | ICD-10-CM | POA: Diagnosis not present

## 2020-12-16 DIAGNOSIS — M545 Low back pain, unspecified: Secondary | ICD-10-CM | POA: Diagnosis not present

## 2020-12-16 DIAGNOSIS — R200 Anesthesia of skin: Secondary | ICD-10-CM | POA: Diagnosis not present

## 2021-01-01 DIAGNOSIS — E782 Mixed hyperlipidemia: Secondary | ICD-10-CM | POA: Diagnosis not present

## 2021-01-01 DIAGNOSIS — Z79899 Other long term (current) drug therapy: Secondary | ICD-10-CM | POA: Diagnosis not present

## 2021-01-01 DIAGNOSIS — E890 Postprocedural hypothyroidism: Secondary | ICD-10-CM | POA: Diagnosis not present

## 2021-01-01 DIAGNOSIS — E118 Type 2 diabetes mellitus with unspecified complications: Secondary | ICD-10-CM | POA: Diagnosis not present

## 2021-01-01 DIAGNOSIS — I10 Essential (primary) hypertension: Secondary | ICD-10-CM | POA: Diagnosis not present

## 2021-01-08 DIAGNOSIS — E782 Mixed hyperlipidemia: Secondary | ICD-10-CM | POA: Diagnosis not present

## 2021-01-08 DIAGNOSIS — E1122 Type 2 diabetes mellitus with diabetic chronic kidney disease: Secondary | ICD-10-CM | POA: Diagnosis not present

## 2021-01-08 DIAGNOSIS — I129 Hypertensive chronic kidney disease with stage 1 through stage 4 chronic kidney disease, or unspecified chronic kidney disease: Secondary | ICD-10-CM | POA: Diagnosis not present

## 2021-01-08 DIAGNOSIS — E890 Postprocedural hypothyroidism: Secondary | ICD-10-CM | POA: Diagnosis not present

## 2021-01-08 DIAGNOSIS — Z79899 Other long term (current) drug therapy: Secondary | ICD-10-CM | POA: Diagnosis not present

## 2021-01-08 DIAGNOSIS — N183 Chronic kidney disease, stage 3 unspecified: Secondary | ICD-10-CM | POA: Diagnosis not present

## 2021-01-08 DIAGNOSIS — Z1231 Encounter for screening mammogram for malignant neoplasm of breast: Secondary | ICD-10-CM | POA: Diagnosis not present

## 2021-01-16 DIAGNOSIS — M545 Low back pain, unspecified: Secondary | ICD-10-CM | POA: Diagnosis not present

## 2021-01-26 ENCOUNTER — Emergency Department
Admission: EM | Admit: 2021-01-26 | Discharge: 2021-01-26 | Disposition: A | Discharge status: Home / Self Care | Payer: Medicare HMO | Attending: Emergency Medicine | Admitting: Emergency Medicine

## 2021-01-26 ENCOUNTER — Emergency Department: Payer: Medicare HMO

## 2021-01-26 ENCOUNTER — Other Ambulatory Visit: Payer: Self-pay

## 2021-01-26 DIAGNOSIS — E1122 Type 2 diabetes mellitus with diabetic chronic kidney disease: Secondary | ICD-10-CM | POA: Insufficient documentation

## 2021-01-26 DIAGNOSIS — I129 Hypertensive chronic kidney disease with stage 1 through stage 4 chronic kidney disease, or unspecified chronic kidney disease: Secondary | ICD-10-CM | POA: Insufficient documentation

## 2021-01-26 DIAGNOSIS — K8689 Other specified diseases of pancreas: Secondary | ICD-10-CM | POA: Diagnosis not present

## 2021-01-26 DIAGNOSIS — Z7982 Long term (current) use of aspirin: Secondary | ICD-10-CM | POA: Diagnosis not present

## 2021-01-26 DIAGNOSIS — E039 Hypothyroidism, unspecified: Secondary | ICD-10-CM | POA: Diagnosis not present

## 2021-01-26 DIAGNOSIS — M545 Low back pain, unspecified: Secondary | ICD-10-CM | POA: Diagnosis not present

## 2021-01-26 DIAGNOSIS — Z79899 Other long term (current) drug therapy: Secondary | ICD-10-CM | POA: Insufficient documentation

## 2021-01-26 DIAGNOSIS — R1031 Right lower quadrant pain: Secondary | ICD-10-CM | POA: Insufficient documentation

## 2021-01-26 DIAGNOSIS — N183 Chronic kidney disease, stage 3 unspecified: Secondary | ICD-10-CM | POA: Diagnosis not present

## 2021-01-26 DIAGNOSIS — M4316 Spondylolisthesis, lumbar region: Secondary | ICD-10-CM | POA: Diagnosis not present

## 2021-01-26 DIAGNOSIS — Z7984 Long term (current) use of oral hypoglycemic drugs: Secondary | ICD-10-CM | POA: Insufficient documentation

## 2021-01-26 DIAGNOSIS — K5730 Diverticulosis of large intestine without perforation or abscess without bleeding: Secondary | ICD-10-CM | POA: Diagnosis not present

## 2021-01-26 DIAGNOSIS — K6389 Other specified diseases of intestine: Secondary | ICD-10-CM | POA: Diagnosis not present

## 2021-01-26 LAB — URINALYSIS, ROUTINE W REFLEX MICROSCOPIC
Bilirubin Urine: NEGATIVE
Glucose, UA: NEGATIVE mg/dL
Hgb urine dipstick: NEGATIVE
Ketones, ur: NEGATIVE mg/dL
Nitrite: NEGATIVE
Protein, ur: NEGATIVE mg/dL
Specific Gravity, Urine: 1.015 (ref 1.005–1.030)
pH: 7.5 (ref 5.0–8.0)

## 2021-01-26 LAB — URINALYSIS, MICROSCOPIC (REFLEX)

## 2021-01-26 MED ORDER — PREDNISONE 20 MG PO TABS: ORAL_TABLET | ORAL | 0 refills | Status: AC

## 2021-01-26 MED ORDER — CYCLOBENZAPRINE HCL 10 MG PO TABS
10.0000 mg | ORAL_TABLET | Freq: Three times a day (TID) | ORAL | 0 refills | Status: DC | PRN
Start: 2021-01-26 — End: 2021-11-19

## 2021-01-26 MED ORDER — IBUPROFEN 600 MG PO TABS
600.0000 mg | ORAL_TABLET | Freq: Once | ORAL | Status: AC
  Administered 2021-01-26: 16:00:00 600 mg via ORAL
  Filled 2021-01-26: qty 1

## 2021-01-26 MED ORDER — CYCLOBENZAPRINE HCL 10 MG PO TABS
10.0000 mg | ORAL_TABLET | Freq: Once | ORAL | Status: AC
  Administered 2021-01-26: 16:00:00 10 mg via ORAL
  Filled 2021-01-26: qty 1

## 2021-01-26 NOTE — ED Triage Notes (Signed)
Pt c/o right lower back into hip for the past week, has a hx of back pain in the past , denies any specific injury, was rx abx and prednisone by PCP

## 2021-01-26 NOTE — ED Provider Notes (Signed)
[facility] Emergency Department Provider Note ____________________________________________   Event Date/Time   First MD Initiated Contact with Patient 01/26/21 1127     (approximate)  I have reviewed the triage vital signs and the nursing notes.   HISTORY  Chief Complaint Back Pain    HPI Katelyn Simmons is a 75 y.o. female with PMH as noted below who presents with right lower back pain over the last couple of weeks, worse in certain positions and when she is walking.  She reports that the pain sometimes radiates around to the right flank and lower abdomen but not into the leg.  She has chronic tingling in the right leg due to chronic back problems but denies any acute change.  She has no urinary incontinence or retention, denies any dysuria or frequency.  She also has no abdominal pain or diarrhea.  The patient denies any trauma.  She was put on a course of prednisone 10 days ago by her primary care doctor but states that she was taking it incorrectly, 10 mg twice daily.  She was also given Macrobid for possible UTI and is still taking this.  Past Medical History:  Diagnosis Date   Abnormal finding on EKG    Anemia    pernicious anemia   Anxiety    Arthritis    everywhere   B12 deficiency    Barrett's esophagus    CKD (chronic kidney disease)    stage III ckd   Collagen vascular disease (HCC)    RA   Depression    Diabetes mellitus without complication (HCC)    Dysphagia    Dyspnea    with exertion   Dysrhythmia    sinus arrythmia or just stops momentarily, per patient   Esophageal stricture    Fatty liver    GERD (gastroesophageal reflux disease)    Goiter, nontoxic, multinodular    History of kidney stones    Hx of repair of left rotator cuff    Hyperlipidemia    Hypertension    Hypothyroidism    Neuromuscular disorder (HCC)    fibromyalgia   Obesity    Pain    Rotator cuff tear arthropathy of both shoulders    Shingles 2008   Stroke  [facility])    mini strokes about 20 years ago.  some memory loss residual   Throat pain     Patient Active Problem List   Diagnosis Date Noted   Hypercholesterolemia 07/08/2020   Diabetes mellitus ([place]) 07/08/2020   S/P lumbar laminectomy 10/25/2018   Essential hypertension 02/20/2015    Past Surgical History:  Procedure Laterality Date   ABDOMINAL HYSTERECTOMY     CHOLECYSTECTOMY     endoscopic carpal tunnel release Bilateral    ESOPHAGOGASTRODUODENOSCOPY     ESOPHAGOGASTRODUODENOSCOPY (EGD) WITH PROPOFOL N/A 03/31/2016   Procedure: ESOPHAGOGASTRODUODENOSCOPY (EGD) WITH PROPOFOL;  Surgeon: Manya Silvas, MD;  Location: [facility];  Service: Endoscopy;  Laterality: N/A;   LUMBAR LAMINECTOMY/DECOMPRESSION MICRODISCECTOMY N/A 10/25/2018   Procedure: LUMBAR LAMINECTOMY/DECOMPRESSION MICRODISCECTOMY 2 LEVELS L3-5;  Surgeon: Meade Maw, MD;  Location: [facility];  Service: Neurosurgery;  Laterality: N/A;   UPPER GI ENDOSCOPY  10/2018   light run    Prior to Admission medications   Medication Sig Start Date End Date Taking? Authorizing Provider  cyclobenzaprine (FLEXERIL) 10 MG tablet Take 1 tablet (10 mg total) by mouth 3 (three) times daily as needed for muscle spasms. 01/26/21  Yes Arta Silence, MD  predniSONE (DELTASONE) 20  MG tablet Take 2 tablets (40 mg total) by mouth daily with breakfast for 4 days, THEN 1 tablet (20 mg total) daily with breakfast for 3 days. 01/27/21 02/03/21 Yes Arta Silence, MD  aspirin EC 81 MG tablet Take 81 mg by mouth every evening.    [provider]  Biotin 1000 MCG tablet Take 1,000 mcg by mouth daily.    [provider]  Blood Glucose Monitoring Suppl (GMATE SMART METER KIT) DEVI by Does not apply route.    [provider]  Cholecalciferol 25 MCG (1000 UT) capsule Take 1,000 Units by mouth every evening.     [provider]  cycloSPORINE (RESTASIS) 0.05 % ophthalmic emulsion Place 1 drop into both  eyes 2 (two) times daily.    [provider]  enalapril (VASOTEC) 5 MG tablet Take 5 mg by mouth 2 (two) times daily.  Patient not taking: Reported on 07/08/2020    [provider]  fenofibrate 160 MG tablet Take 160 mg by mouth at bedtime.     [provider]  glimepiride (AMARYL) 4 MG tablet Take 4 mg by mouth daily with breakfast. Patient not taking: Reported on 07/08/2020    [provider]  glucose blood test strip 1 each by Other route as needed for other. Use as instructed    [provider]  hydrochlorothiazide (HYDRODIURIL) 12.5 MG tablet Take 12.5 mg by mouth daily.    [provider]  Lancets Misc. KIT by Does not apply route.    [provider]  levocetirizine Harlow Ohms) 5 MG tablet Take by mouth. 04/27/19 04/26/20  [provider]  levothyroxine (SYNTHROID) 100 MCG tablet  04/27/19   [provider]  levothyroxine (SYNTHROID) 75 MCG tablet  11/27/18   [provider]  liothyronine (CYTOMEL) 5 MCG tablet Take 5 mcg by mouth daily.    [provider]  metFORMIN (GLUCOPHAGE) 1000 MG tablet Take 1,000 mg by mouth 2 (two) times daily with a meal.    [provider]  metoprolol succinate (TOPROL-XL) 100 MG 24 hr tablet Take 100 mg by mouth daily. Take with or immediately following a meal.    [provider]  Misc Natural Products (LUTEIN 20 PO) Take 20 mg by mouth daily.    [provider]  pantoprazole (PROTONIX) 40 MG tablet Take 40 mg by mouth daily before breakfast.     [provider]  pioglitazone (ACTOS) 45 MG tablet Take 45 mg by mouth daily.    [provider]  rosuvastatin (CRESTOR) 10 MG tablet Take 10 mg by mouth at bedtime.     [provider]  venlafaxine XR (EFFEXOR-XR) 37.5 MG 24 hr capsule Take 37.5 mg by mouth daily with breakfast.    [provider]  vitamin B-12 (CYANOCOBALAMIN) 1000 MCG tablet Take 1,000 mcg by mouth  daily.    [provider]    Allergies Sulfa antibiotics, Eggs or egg-derived products, Methocarbamol, Parafon forte dsc [chlorzoxazone], and Codeine  Family History  Problem Relation Age of Onset   Breast cancer Neg Hx     Social History Social History   Tobacco Use   Smoking status: Never   Smokeless tobacco: Never  Vaping Use   Vaping Use: Never used  Substance Use Topics   Alcohol use: Yes    Comment: rare   Drug use: No    Review of Systems  Constitutional: No fever. Eyes: No visual changes. ENT: No sore throat. Cardiovascular: Denies chest  pain. Respiratory: Denies shortness of breath. Gastrointestinal: No vomiting or diarrhea.  Genitourinary: Negative for dysuria, frequency, incontinence, retention.  Musculoskeletal: Positive for back pain. Skin: Negative for rash. Neurological: Negative for headaches, focal weakness or numbness.   ____________________________________________   PHYSICAL EXAM:  VITAL SIGNS: ED Triage Vitals  Enc Vitals Group     BP 01/26/21 1126 (!) 150/70     Pulse Rate 01/26/21 1125 73     Resp 01/26/21 1125 18     Temp 01/26/21 1125 97.7 F (36.5 C)     Temp Source 01/26/21 1125 Oral     SpO2 01/26/21 1125 98 %     Weight 01/26/21 1126 172 lb (78 kg)     Height 01/26/21 1126 '5\' 2"'  (1.575 m)     Head Circumference --      Peak Flow --      Pain Score 01/26/21 1126 10     Pain Loc --      Pain Edu? --      Excl. in [place]? --     Constitutional: Alert and oriented. Well appearing and in no acute distress. Eyes: Conjunctivae are normal.  Head: Atraumatic. Nose: No congestion/rhinnorhea. Mouth/Throat: Mucous membranes are moist.   Neck: Normal range of motion.  Cardiovascular: Normal rate, regular rhythm. Good peripheral circulation. Respiratory: Normal respiratory effort.  No retractions.  Gastrointestinal: Soft and nontender. No distention.  Genitourinary: No CVA tenderness. Musculoskeletal: No lower extremity  edema.  Extremities warm and well perfused.  Negative straight leg raise test.  No midline spinal tenderness.  Mild right lumbar paraspinal muscle tenderness. Neurologic:  Normal speech and language.  5/5 motor strength and intact sensation to bilateral lower extremities.  No gross focal neurologic deficits are appreciated.  Skin:  Skin is warm and dry. No rash noted. Psychiatric: Mood and affect are normal. Speech and behavior are normal.  ____________________________________________   LABS (all labs ordered are listed, but only abnormal results are displayed)  Labs Reviewed  URINALYSIS, ROUTINE W REFLEX MICROSCOPIC - Abnormal; Notable for the following components:      Result Value   Leukocytes,Ua TRACE (*)    All other components within normal limits  URINALYSIS, MICROSCOPIC (REFLEX) - Abnormal; Notable for the following components:   Bacteria, UA FEW (*)    All other components within normal limits   ____________________________________________  EKG   ____________________________________________  RADIOLOGY  CT abdomen/pelvis: IMPRESSION:  Sigmoid diverticulosis with mild wall thickening and minimal  pericolic infiltration at mid sigmoid colon suspicious for subtle  diverticulitis.     No evidence of perforation or abscess.     No urinary tract calcification or dilatation.     Aortic Atherosclerosis (ICD10-I70.0).    ____________________________________________   PROCEDURES  Procedure(s) performed: No  Procedures  Critical Care performed: No ____________________________________________   INITIAL IMPRESSION / ASSESSMENT AND PLAN / ED COURSE  Pertinent labs & imaging results that were available during my care of the patient were reviewed by me and considered in my medical decision making (see chart for details).   75 year old female presents with acute to subacute atraumatic right lower back pain with no radiation to the leg and no neurologic symptoms.  On  exam she is well-appearing and her vital signs are normal except for hypertension.  There is no midline spinal tenderness and straight leg raise test is negative.  Neurologic exam is normal.  Overall presentation is most consistent with muscle strain/spasm versus radicular pain.  Differential also includes ureteral stone.  I have a very low suspicion for pyelonephritis given the patient's lack of urinary symptoms or fever.  We will obtain a CT to rule out ureteral stone.  ----------------------------------------- 4:27 PM on 01/26/2021 -----------------------------------------  CT is negative for ureteral stone.  The patient's urinalysis does show small quantity of white blood cells and few bacteria, consistent with partially treated UTI although clinically there is still no evidence of pyelonephritis.  I have instructed her to finish the Macrobid course.  I will prescribe Flexeril and a slightly higher dose of prednisone (40 mg daily down to 20 mg after 4 days) to treat likely radiculopathy versus muscle strain.  The CT does show findings of possible diverticulitis, however the patient has no abdominal pain, diarrhea, or other clinical findings of acute diverticulitis so there is no indication for treatment.  At this time, the patient is stable for discharge home.  I counseled her on the results of the work-up and the plan of care.  Return precautions given, she expresses understanding.   ____________________________________________   FINAL CLINICAL IMPRESSION(S) / ED DIAGNOSES  Final diagnoses:  Acute right-sided low back pain without sciatica      NEW MEDICATIONS STARTED DURING THIS VISIT:  New Prescriptions   CYCLOBENZAPRINE (FLEXERIL) 10 MG TABLET    Take 1 tablet (10 mg total) by mouth 3 (three) times daily as needed for muscle spasms.   PREDNISONE (DELTASONE) 20 MG TABLET    Take 2 tablets (40 mg total) by mouth daily with breakfast for 4 days, THEN 1 tablet (20 mg total) daily  with breakfast for 3 days.     Note:  This document was prepared using Dragon voice recognition software and may include unintentional dictation errors.    Arta Silence, MD 01/26/21 [phone]

## 2021-01-26 NOTE — Discharge Instructions (Addendum)
Take the slightly higher dose of prednisone as prescribed.  You can take the Flexeril (cyclobenzaprine) as well.  This is a muscle relaxant.  In addition you may take over-the-counter ibuprofen or Tylenol.  Finish the [employer] (nitrofurantoin) antibiotic you are already on.  Follow-up with your regular doctor.  Return to the ER for new, worsening, or persistent severe pain, weakness or numbness in the leg, difficulty walking, difficulty urinating, incontinence, or any other new or worsening symptoms that concern you.

## 2021-01-26 NOTE — ED Provider Notes (Signed)
Emergency Medicine Provider Triage Evaluation Note  Katelyn Simmons , a 75 y.o. female  was evaluated in triage.  Pt complains of back pain. She was treated by primary car for possible UTI. And was prescribed prednisone and antibiotic. Pain has not improved. No specific injury..  Review of Systems  Positive: Back pain Negative: Dysuria  Physical Exam  There were no vitals taken for this visit. Gen:   Awake, no distress   Resp:  Normal effort  MSK:   Moves extremities without difficulty  Other:    Medical Decision Making  Medically screening exam initiated at 11:24 AM.  Appropriate orders placed.  Kinya L Kettering was informed that the remainder of the evaluation will be completed by another provider, this initial triage assessment does not replace that evaluation, and the importance of remaining in the ED until their evaluation is complete.    Victorino Dike, FNP 01/26/21 1127    Arta Silence, MD 01/26/21 [id number]

## 2021-02-05 ENCOUNTER — Telehealth: Payer: Self-pay

## 2021-02-05 NOTE — Patient Outreach (Signed)
Called Katelyn Simmons to let her know her appointment on January 11 with Jon Billings, RN her Nurse Care Manager will be reschedule in February.  [place] Care, [place], [place] Management [phone]

## 2021-02-11 ENCOUNTER — Ambulatory Visit: Payer: Self-pay

## 2021-02-11 DIAGNOSIS — I10 Essential (primary) hypertension: Secondary | ICD-10-CM | POA: Diagnosis not present

## 2021-02-11 DIAGNOSIS — R1032 Left lower quadrant pain: Secondary | ICD-10-CM | POA: Diagnosis not present

## 2021-02-11 DIAGNOSIS — M545 Low back pain, unspecified: Secondary | ICD-10-CM | POA: Diagnosis not present

## 2021-03-04 DIAGNOSIS — H6982 Other specified disorders of Eustachian tube, left ear: Secondary | ICD-10-CM | POA: Diagnosis not present

## 2021-03-04 DIAGNOSIS — H90A22 Sensorineural hearing loss, unilateral, left ear, with restricted hearing on the contralateral side: Secondary | ICD-10-CM | POA: Diagnosis not present

## 2021-03-04 DIAGNOSIS — R490 Dysphonia: Secondary | ICD-10-CM | POA: Diagnosis not present

## 2021-03-04 DIAGNOSIS — J301 Allergic rhinitis due to pollen: Secondary | ICD-10-CM | POA: Diagnosis not present

## 2021-04-02 DIAGNOSIS — E118 Type 2 diabetes mellitus with unspecified complications: Secondary | ICD-10-CM | POA: Diagnosis not present

## 2021-04-02 DIAGNOSIS — Z79899 Other long term (current) drug therapy: Secondary | ICD-10-CM | POA: Diagnosis not present

## 2021-04-02 DIAGNOSIS — E890 Postprocedural hypothyroidism: Secondary | ICD-10-CM | POA: Diagnosis not present

## 2021-04-06 ENCOUNTER — Other Ambulatory Visit: Payer: Self-pay

## 2021-04-06 NOTE — Patient Outreach (Signed)
[facility]) Care Management ? ?04/06/2021 ? ?Katelyn Simmons ?[birth year] ?[id number] ? ? ?Telephone call to patient for disease management follow up.   No answer.  HIPAA compliant voice message left.   ? ?Plan: If no return call, RN CM will attempt patient again in May. ? ?[name], RN, MSN ?[facility] Care Management ?Care Management Coordinator ?Direct Line [phone] ?Toll Free: [phone]  ?Fax: [phone] ? ?

## 2021-04-09 DIAGNOSIS — N189 Chronic kidney disease, unspecified: Secondary | ICD-10-CM | POA: Diagnosis not present

## 2021-04-09 DIAGNOSIS — E1122 Type 2 diabetes mellitus with diabetic chronic kidney disease: Secondary | ICD-10-CM | POA: Diagnosis not present

## 2021-04-09 DIAGNOSIS — I129 Hypertensive chronic kidney disease with stage 1 through stage 4 chronic kidney disease, or unspecified chronic kidney disease: Secondary | ICD-10-CM | POA: Diagnosis not present

## 2021-04-09 DIAGNOSIS — E785 Hyperlipidemia, unspecified: Secondary | ICD-10-CM | POA: Diagnosis not present

## 2021-04-09 DIAGNOSIS — Z1231 Encounter for screening mammogram for malignant neoplasm of breast: Secondary | ICD-10-CM | POA: Diagnosis not present

## 2021-04-09 DIAGNOSIS — E538 Deficiency of other specified B group vitamins: Secondary | ICD-10-CM | POA: Diagnosis not present

## 2021-04-09 DIAGNOSIS — Z79899 Other long term (current) drug therapy: Secondary | ICD-10-CM | POA: Diagnosis not present

## 2021-04-09 DIAGNOSIS — E079 Disorder of thyroid, unspecified: Secondary | ICD-10-CM | POA: Diagnosis not present

## 2021-04-09 DIAGNOSIS — Z0000 Encounter for general adult medical examination without abnormal findings: Secondary | ICD-10-CM | POA: Diagnosis not present

## 2021-04-10 ENCOUNTER — Other Ambulatory Visit: Payer: Self-pay | Admitting: Internal Medicine

## 2021-04-10 DIAGNOSIS — Z1231 Encounter for screening mammogram for malignant neoplasm of breast: Secondary | ICD-10-CM

## 2021-04-13 DIAGNOSIS — H16223 Keratoconjunctivitis sicca, not specified as Sjogren's, bilateral: Secondary | ICD-10-CM | POA: Diagnosis not present

## 2021-04-13 DIAGNOSIS — H5213 Myopia, bilateral: Secondary | ICD-10-CM | POA: Diagnosis not present

## 2021-04-13 DIAGNOSIS — H16141 Punctate keratitis, right eye: Secondary | ICD-10-CM | POA: Diagnosis not present

## 2021-04-13 DIAGNOSIS — E119 Type 2 diabetes mellitus without complications: Secondary | ICD-10-CM | POA: Diagnosis not present

## 2021-04-13 DIAGNOSIS — H353132 Nonexudative age-related macular degeneration, bilateral, intermediate dry stage: Secondary | ICD-10-CM | POA: Diagnosis not present

## 2021-04-13 DIAGNOSIS — Z0100 Encounter for examination of eyes and vision without abnormal findings: Secondary | ICD-10-CM | POA: Diagnosis not present

## 2021-04-15 DIAGNOSIS — G8929 Other chronic pain: Secondary | ICD-10-CM | POA: Diagnosis not present

## 2021-04-15 DIAGNOSIS — R200 Anesthesia of skin: Secondary | ICD-10-CM | POA: Diagnosis not present

## 2021-04-15 DIAGNOSIS — R531 Weakness: Secondary | ICD-10-CM | POA: Diagnosis not present

## 2021-04-15 DIAGNOSIS — R2689 Other abnormalities of gait and mobility: Secondary | ICD-10-CM | POA: Diagnosis not present

## 2021-04-15 DIAGNOSIS — R202 Paresthesia of skin: Secondary | ICD-10-CM | POA: Diagnosis not present

## 2021-04-15 DIAGNOSIS — G5603 Carpal tunnel syndrome, bilateral upper limbs: Secondary | ICD-10-CM | POA: Diagnosis not present

## 2021-04-15 DIAGNOSIS — M545 Low back pain, unspecified: Secondary | ICD-10-CM | POA: Diagnosis not present

## 2021-04-15 DIAGNOSIS — R29898 Other symptoms and signs involving the musculoskeletal system: Secondary | ICD-10-CM | POA: Diagnosis not present

## 2021-04-15 DIAGNOSIS — M25511 Pain in right shoulder: Secondary | ICD-10-CM | POA: Diagnosis not present

## 2021-04-28 DIAGNOSIS — D510 Vitamin B12 deficiency anemia due to intrinsic factor deficiency: Secondary | ICD-10-CM | POA: Diagnosis not present

## 2021-04-28 DIAGNOSIS — M1990 Unspecified osteoarthritis, unspecified site: Secondary | ICD-10-CM | POA: Diagnosis not present

## 2021-04-28 DIAGNOSIS — E119 Type 2 diabetes mellitus without complications: Secondary | ICD-10-CM | POA: Diagnosis not present

## 2021-04-28 DIAGNOSIS — G5603 Carpal tunnel syndrome, bilateral upper limbs: Secondary | ICD-10-CM | POA: Diagnosis not present

## 2021-04-28 DIAGNOSIS — I10 Essential (primary) hypertension: Secondary | ICD-10-CM | POA: Diagnosis not present

## 2021-04-28 DIAGNOSIS — M545 Low back pain, unspecified: Secondary | ICD-10-CM | POA: Diagnosis not present

## 2021-04-28 DIAGNOSIS — G8929 Other chronic pain: Secondary | ICD-10-CM | POA: Diagnosis not present

## 2021-05-13 DIAGNOSIS — D2271 Melanocytic nevi of right lower limb, including hip: Secondary | ICD-10-CM | POA: Diagnosis not present

## 2021-05-13 DIAGNOSIS — D2262 Melanocytic nevi of left upper limb, including shoulder: Secondary | ICD-10-CM | POA: Diagnosis not present

## 2021-05-13 DIAGNOSIS — L570 Actinic keratosis: Secondary | ICD-10-CM | POA: Diagnosis not present

## 2021-05-13 DIAGNOSIS — D2272 Melanocytic nevi of left lower limb, including hip: Secondary | ICD-10-CM | POA: Diagnosis not present

## 2021-05-13 DIAGNOSIS — E538 Deficiency of other specified B group vitamins: Secondary | ICD-10-CM | POA: Diagnosis not present

## 2021-05-13 DIAGNOSIS — X32XXXA Exposure to sunlight, initial encounter: Secondary | ICD-10-CM | POA: Diagnosis not present

## 2021-05-13 DIAGNOSIS — D225 Melanocytic nevi of trunk: Secondary | ICD-10-CM | POA: Diagnosis not present

## 2021-05-13 DIAGNOSIS — D2261 Melanocytic nevi of right upper limb, including shoulder: Secondary | ICD-10-CM | POA: Diagnosis not present

## 2021-05-13 DIAGNOSIS — L821 Other seborrheic keratosis: Secondary | ICD-10-CM | POA: Diagnosis not present

## 2021-05-20 ENCOUNTER — Other Ambulatory Visit: Payer: Self-pay | Admitting: Internal Medicine

## 2021-05-20 ENCOUNTER — Ambulatory Visit
Admission: RE | Admit: 2021-05-20 | Discharge: 2021-05-20 | Disposition: A | Discharge status: Home / Self Care | Payer: Medicare HMO | Source: Ambulatory Visit | Attending: Internal Medicine | Admitting: Internal Medicine

## 2021-05-20 DIAGNOSIS — R928 Other abnormal and inconclusive findings on diagnostic imaging of breast: Secondary | ICD-10-CM

## 2021-05-20 DIAGNOSIS — N6489 Other specified disorders of breast: Secondary | ICD-10-CM

## 2021-05-20 DIAGNOSIS — Z1231 Encounter for screening mammogram for malignant neoplasm of breast: Secondary | ICD-10-CM | POA: Diagnosis not present

## 2021-06-04 ENCOUNTER — Ambulatory Visit
Admission: RE | Admit: 2021-06-04 | Discharge: 2021-06-04 | Disposition: A | Discharge status: Home / Self Care | Payer: Medicare HMO | Source: Ambulatory Visit | Attending: Internal Medicine | Admitting: Internal Medicine

## 2021-06-04 DIAGNOSIS — N6489 Other specified disorders of breast: Secondary | ICD-10-CM

## 2021-06-04 DIAGNOSIS — N6311 Unspecified lump in the right breast, upper outer quadrant: Secondary | ICD-10-CM | POA: Diagnosis not present

## 2021-06-04 DIAGNOSIS — R928 Other abnormal and inconclusive findings on diagnostic imaging of breast: Secondary | ICD-10-CM | POA: Insufficient documentation

## 2021-06-11 ENCOUNTER — Other Ambulatory Visit: Payer: Self-pay

## 2021-06-11 NOTE — Patient Outreach (Signed)
[facility]) Care Management ? ?06/11/2021 ? ?Katelyn Simmons ?1946-01-20 ?[id number] ? ? ?Telephone call to patient for follow up.  No answer.  Unable able to leave a message.   ? ?Plan: RN CM will attempt again in the month of July and send letter. ? ?[name], RN, MSN ?[facility] Care Management ?Care Management Coordinator ?Direct Line [phone] ?Toll Free: [phone]  ?Fax: [phone] ? ? ? ?

## 2021-06-16 DIAGNOSIS — E538 Deficiency of other specified B group vitamins: Secondary | ICD-10-CM | POA: Diagnosis not present

## 2021-06-17 ENCOUNTER — Observation Stay
Admission: EM | Admit: 2021-06-17 | Discharge: 2021-06-18 | Disposition: A | Discharge status: Home / Self Care | Payer: Medicare HMO | Attending: Internal Medicine | Admitting: Internal Medicine

## 2021-06-17 ENCOUNTER — Emergency Department: Payer: Medicare HMO

## 2021-06-17 ENCOUNTER — Other Ambulatory Visit: Payer: Self-pay

## 2021-06-17 DIAGNOSIS — E785 Hyperlipidemia, unspecified: Secondary | ICD-10-CM | POA: Diagnosis present

## 2021-06-17 DIAGNOSIS — E039 Hypothyroidism, unspecified: Secondary | ICD-10-CM | POA: Diagnosis present

## 2021-06-17 DIAGNOSIS — E1122 Type 2 diabetes mellitus with diabetic chronic kidney disease: Secondary | ICD-10-CM | POA: Insufficient documentation

## 2021-06-17 DIAGNOSIS — Z79899 Other long term (current) drug therapy: Secondary | ICD-10-CM | POA: Diagnosis not present

## 2021-06-17 DIAGNOSIS — K219 Gastro-esophageal reflux disease without esophagitis: Secondary | ICD-10-CM | POA: Diagnosis not present

## 2021-06-17 DIAGNOSIS — Z7982 Long term (current) use of aspirin: Secondary | ICD-10-CM | POA: Insufficient documentation

## 2021-06-17 DIAGNOSIS — Z6829 Body mass index (BMI) 29.0-29.9, adult: Secondary | ICD-10-CM | POA: Diagnosis not present

## 2021-06-17 DIAGNOSIS — N183 Chronic kidney disease, stage 3 unspecified: Secondary | ICD-10-CM | POA: Insufficient documentation

## 2021-06-17 DIAGNOSIS — Z7984 Long term (current) use of oral hypoglycemic drugs: Secondary | ICD-10-CM | POA: Diagnosis not present

## 2021-06-17 DIAGNOSIS — E663 Overweight: Secondary | ICD-10-CM | POA: Diagnosis present

## 2021-06-17 DIAGNOSIS — G454 Transient global amnesia: Secondary | ICD-10-CM | POA: Diagnosis not present

## 2021-06-17 DIAGNOSIS — G459 Transient cerebral ischemic attack, unspecified: Secondary | ICD-10-CM | POA: Diagnosis not present

## 2021-06-17 DIAGNOSIS — Z8673 Personal history of transient ischemic attack (TIA), and cerebral infarction without residual deficits: Secondary | ICD-10-CM | POA: Insufficient documentation

## 2021-06-17 DIAGNOSIS — F32A Depression, unspecified: Secondary | ICD-10-CM | POA: Diagnosis present

## 2021-06-17 DIAGNOSIS — I129 Hypertensive chronic kidney disease with stage 1 through stage 4 chronic kidney disease, or unspecified chronic kidney disease: Secondary | ICD-10-CM | POA: Insufficient documentation

## 2021-06-17 DIAGNOSIS — E1140 Type 2 diabetes mellitus with diabetic neuropathy, unspecified: Secondary | ICD-10-CM | POA: Diagnosis not present

## 2021-06-17 DIAGNOSIS — E876 Hypokalemia: Secondary | ICD-10-CM | POA: Diagnosis present

## 2021-06-17 DIAGNOSIS — E1142 Type 2 diabetes mellitus with diabetic polyneuropathy: Secondary | ICD-10-CM | POA: Diagnosis present

## 2021-06-17 DIAGNOSIS — R4182 Altered mental status, unspecified: Secondary | ICD-10-CM | POA: Diagnosis present

## 2021-06-17 DIAGNOSIS — R413 Other amnesia: Secondary | ICD-10-CM

## 2021-06-17 LAB — COMPREHENSIVE METABOLIC PANEL
ALT: 26 U/L (ref 0–44)
AST: 29 U/L (ref 15–41)
Albumin: 3.7 g/dL (ref 3.5–5.0)
Alkaline Phosphatase: 43 U/L (ref 38–126)
Anion gap: 7 (ref 5–15)
BUN: 19 mg/dL (ref 8–23)
CO2: 26 mmol/L (ref 22–32)
Calcium: 9.3 mg/dL (ref 8.9–10.3)
Chloride: 103 mmol/L (ref 98–111)
Creatinine, Ser: 1.04 mg/dL — ABNORMAL HIGH (ref 0.44–1.00)
GFR, Estimated: 56 mL/min — ABNORMAL LOW (ref >60)
Glucose, Bld: 95 mg/dL (ref 70–99)
Potassium: 3 mmol/L — ABNORMAL LOW (ref 3.5–5.1)
Sodium: 136 mmol/L (ref 135–145)
Total Bilirubin: 0.5 mg/dL (ref 0.3–1.2)
Total Protein: 7.3 g/dL (ref 6.5–8.1)

## 2021-06-17 LAB — CBC
HCT: 34.5 % — ABNORMAL LOW (ref 36.0–46.0)
Hemoglobin: 11.3 g/dL — ABNORMAL LOW (ref 12.0–15.0)
MCH: 29.4 pg (ref 26.0–34.0)
MCHC: 32.8 g/dL (ref 30.0–36.0)
MCV: 89.6 fL (ref 80.0–100.0)
Platelets: 389 10*3/uL (ref 150–400)
RBC: 3.85 MIL/uL — ABNORMAL LOW (ref 3.87–5.11)
RDW: 15.4 % (ref 11.5–15.5)
WBC: 7.2 10*3/uL (ref 4.0–10.5)
nRBC: 0 % (ref 0.0–0.2)

## 2021-06-17 LAB — DIFFERENTIAL
Abs Immature Granulocytes: 0.04 10*3/uL (ref 0.00–0.07)
Basophils Absolute: 0.1 10*3/uL (ref 0.0–0.1)
Basophils Relative: 1 %
Eosinophils Absolute: 0.1 10*3/uL (ref 0.0–0.5)
Eosinophils Relative: 2 %
Immature Granulocytes: 1 %
Lymphocytes Relative: 24 %
Lymphs Abs: 1.7 10*3/uL (ref 0.7–4.0)
Monocytes Absolute: 0.6 10*3/uL (ref 0.1–1.0)
Monocytes Relative: 8 %
Neutro Abs: 4.7 10*3/uL (ref 1.7–7.7)
Neutrophils Relative %: 64 %

## 2021-06-17 LAB — CBG MONITORING, ED: Glucose-Capillary: 86 mg/dL (ref 70–99)

## 2021-06-17 LAB — PROTIME-INR
INR: 1 (ref 0.8–1.2)
Prothrombin Time: 13.4 seconds (ref 11.4–15.2)

## 2021-06-17 LAB — APTT: aPTT: 32 seconds (ref 24–36)

## 2021-06-17 MED ORDER — METOPROLOL SUCCINATE ER 50 MG PO TB24
100.0000 mg | ORAL_TABLET | Freq: Every day | ORAL | Status: DC
Start: 2021-06-18 — End: 2021-06-18
  Administered 2021-06-18: 100 mg via ORAL
  Filled 2021-06-17: qty 2

## 2021-06-17 MED ORDER — STROKE: EARLY STAGES OF RECOVERY BOOK
Freq: Once | Status: AC
Start: 2021-06-17 — End: 2021-06-18

## 2021-06-17 MED ORDER — INSULIN ASPART 100 UNIT/ML IJ SOLN: 0.0000 [IU] | Freq: Three times a day (TID) | INTRAMUSCULAR | Status: DC

## 2021-06-17 MED ORDER — POTASSIUM CHLORIDE 20 MEQ PO PACK
40.0000 meq | PACK | Freq: Once | ORAL | Status: AC
  Administered 2021-06-18: 40 meq via ORAL
  Filled 2021-06-17: qty 2

## 2021-06-17 MED ORDER — SODIUM CHLORIDE 0.9 % IV SOLN: INTRAVENOUS | Status: DC

## 2021-06-17 MED ORDER — SENNOSIDES-DOCUSATE SODIUM 8.6-50 MG PO TABS: 1.0000 | ORAL_TABLET | Freq: Every evening | ORAL | Status: DC | PRN

## 2021-06-17 MED ORDER — LEVOTHYROXINE SODIUM 125 MCG PO TABS
125.0000 ug | ORAL_TABLET | Freq: Every day | ORAL | Status: DC
  Administered 2021-06-18: 125 ug via ORAL
  Filled 2021-06-17: qty 1

## 2021-06-17 MED ORDER — SODIUM CHLORIDE 0.9% FLUSH
3.0000 mL | Freq: Once | INTRAVENOUS | Status: AC
  Administered 2021-06-17: 3 mL via INTRAVENOUS

## 2021-06-17 MED ORDER — ACETAMINOPHEN 650 MG RE SUPP: 650.0000 mg | RECTAL | Status: DC | PRN

## 2021-06-17 MED ORDER — TRAZODONE HCL 50 MG PO TABS
25.0000 mg | ORAL_TABLET | Freq: Every evening | ORAL | Status: DC | PRN
Start: 2021-06-17 — End: 2021-06-18

## 2021-06-17 MED ORDER — FENOFIBRATE 160 MG PO TABS: 160.0000 mg | ORAL_TABLET | Freq: Every day | ORAL | Status: DC

## 2021-06-17 MED ORDER — ENOXAPARIN SODIUM 40 MG/0.4ML IJ SOSY
40.0000 mg | PREFILLED_SYRINGE | INTRAMUSCULAR | Status: DC
  Administered 2021-06-18: 40 mg via SUBCUTANEOUS
  Filled 2021-06-17: qty 0.4

## 2021-06-17 MED ORDER — ASPIRIN 81 MG PO TBEC
81.0000 mg | DELAYED_RELEASE_TABLET | Freq: Every day | ORAL | Status: DC
  Administered 2021-06-18: 81 mg via ORAL
  Filled 2021-06-17: qty 1

## 2021-06-17 MED ORDER — ONDANSETRON HCL 4 MG/2ML IJ SOLN: 4.0000 mg | INTRAMUSCULAR | Status: DC | PRN

## 2021-06-17 MED ORDER — LIOTHYRONINE SODIUM 5 MCG PO TABS
5.0000 ug | ORAL_TABLET | Freq: Every day | ORAL | Status: DC
  Administered 2021-06-18: 5 ug via ORAL
  Filled 2021-06-17: qty 1

## 2021-06-17 MED ORDER — HYDROCHLOROTHIAZIDE 12.5 MG PO TABS
25.0000 mg | ORAL_TABLET | Freq: Every day | ORAL | Status: DC
  Administered 2021-06-18: 25 mg via ORAL
  Filled 2021-06-17: qty 2

## 2021-06-17 MED ORDER — VITAMIN B-12 1000 MCG PO TABS
1000.0000 ug | ORAL_TABLET | Freq: Every day | ORAL | Status: DC
  Administered 2021-06-18: 1000 ug via ORAL
  Filled 2021-06-17: qty 1

## 2021-06-17 MED ORDER — ACETAMINOPHEN 160 MG/5ML PO SOLN: 650.0000 mg | ORAL | Status: DC | PRN

## 2021-06-17 MED ORDER — PANTOPRAZOLE SODIUM 40 MG PO TBEC
40.0000 mg | DELAYED_RELEASE_TABLET | Freq: Every day | ORAL | Status: DC
  Administered 2021-06-18: 40 mg via ORAL
  Filled 2021-06-17: qty 1

## 2021-06-17 MED ORDER — ACETAMINOPHEN 325 MG PO TABS: 650.0000 mg | ORAL_TABLET | ORAL | Status: DC | PRN

## 2021-06-17 MED ORDER — ROSUVASTATIN CALCIUM 10 MG PO TABS
10.0000 mg | ORAL_TABLET | Freq: Every day | ORAL | Status: DC
Start: 2021-06-18 — End: 2021-06-18

## 2021-06-17 MED ORDER — LORAZEPAM 2 MG/ML IJ SOLN
1.0000 mg | Freq: Once | INTRAMUSCULAR | Status: AC
  Administered 2021-06-17: 1 mg via INTRAVENOUS
  Filled 2021-06-17: qty 1

## 2021-06-17 MED ORDER — PIOGLITAZONE HCL 15 MG PO TABS
45.0000 mg | ORAL_TABLET | Freq: Every day | ORAL | Status: DC
  Administered 2021-06-18: 45 mg via ORAL
  Filled 2021-06-17: qty 3

## 2021-06-17 MED ORDER — VENLAFAXINE HCL ER 37.5 MG PO CP24
37.5000 mg | ORAL_CAPSULE | Freq: Every day | ORAL | Status: DC
Start: 2021-06-18 — End: 2021-06-18
  Administered 2021-06-18: 37.5 mg via ORAL
  Filled 2021-06-17: qty 1

## 2021-06-17 NOTE — ED Triage Notes (Signed)
Pt presents to ER with son.  Son states pt got out of shower around [employer] and had sudden onset of short term memory loss.  Per son, pt is normally A&O x4, but today after shower has been unable to remember stuff that happened a few hours ago.  Pt is currently A&O x2 in NAD.  NIH 1 at this time.   ?

## 2021-06-17 NOTE — ED Notes (Signed)
Neurologist cancelled stroke alert ?

## 2021-06-17 NOTE — H&P (Addendum)
[place]   PATIENT NAME: Katelyn Simmons    MR#:  [id number]  DATE OF BIRTH:  18--1947  DATE OF ADMISSION:  06/17/2021  PRIMARY CARE PHYSICIAN: Idelle Crouch, MD   Patient is coming from: Home  REQUESTING/REFERRING PHYSICIAN: Vladimir Crofts, MD  CHIEF COMPLAINT:   Chief Complaint  Patient presents with   Altered Mental Status    HISTORY OF PRESENT ILLNESS:  Katelyn Simmons is a 76 y.o. Caucasian female with medical history significant for hypertension, dyslipidemia, hypothyroidism, stage III chronic kidney disease, type 2 diabetes mellitus, GERD, and fibromyalgia, who presented to the emergency room with acute onset of transient global amnesia since 7:30 PM with associated short-term memory loss.  She denies any head injuries or presyncope or syncope or falls.  No paresthesias or focal muscle weakness.  No tinnitus or vertigo.  No urinary or stool incontinence.  No dysphagia or dysarthria.  No witnessed seizures.  No chest pain or dyspnea or palpitations.  No cough or wheezing.  No dysuria, oliguria or hematuria or flank pain.  ED Course: Upon presenting to the emergency room, BP was 159/63 with otherwise normal vital signs.  Labs revealed potassium of 3 and CBC showed hemoglobin of 11.3 hematocrit 34.5 above previous levels coagulation profile was within normal. EKG as reviewed by me : EKG showed normal sinus rhythm with a rate of 73 with first-degree AV block, low voltage QRS and right bundle branch block with T wave inversion inferiorly. Imaging: Noncontrast head CT scan came back normal and brain MRI was normal.  The patient was given 1 mg of IV Ativan and 40 M EQ p.o. potassium chloride.  She will be admitted to an observation medical telemetry bed for further evaluation and management. PAST MEDICAL HISTORY:   Past Medical History:  Diagnosis Date   Abnormal finding on EKG    Anemia    pernicious anemia   Anxiety    Arthritis    everywhere   B12 deficiency     Barrett's esophagus    CKD (chronic kidney disease)    stage III ckd   Collagen vascular disease (HCC)    RA   Depression    Diabetes mellitus without complication (HCC)    Dysphagia    Dyspnea    with exertion   Dysrhythmia    sinus arrythmia or just stops momentarily, per patient   Esophageal stricture    Fatty liver    GERD (gastroesophageal reflux disease)    Goiter, nontoxic, multinodular    History of kidney stones    Hx of repair of left rotator cuff    Hyperlipidemia    Hypertension    Hypothyroidism    Neuromuscular disorder (HCC)    fibromyalgia   Obesity    Pain    Rotator cuff tear arthropathy of both shoulders    Shingles 2008   Stroke [facility])    mini strokes about 20 years ago.  some memory loss residual   Throat pain     PAST SURGICAL HISTORY:   Past Surgical History:  Procedure Laterality Date   ABDOMINAL HYSTERECTOMY     CHOLECYSTECTOMY     endoscopic carpal tunnel release Bilateral    ESOPHAGOGASTRODUODENOSCOPY     ESOPHAGOGASTRODUODENOSCOPY (EGD) WITH PROPOFOL N/A 03/31/2016   Procedure: ESOPHAGOGASTRODUODENOSCOPY (EGD) WITH PROPOFOL;  Surgeon: Manya Silvas, MD;  Location: [place];  Service: Endoscopy;  Laterality: N/A;   LUMBAR LAMINECTOMY/DECOMPRESSION MICRODISCECTOMY N/A 10/25/2018   Procedure: LUMBAR LAMINECTOMY/DECOMPRESSION  MICRODISCECTOMY 2 LEVELS L3-5;  Surgeon: Meade Maw, MD;  Location: [facility];  Service: Neurosurgery;  Laterality: N/A;   UPPER GI ENDOSCOPY  10/2018   light run    SOCIAL HISTORY:   Social History   Tobacco Use   Smoking status: Never   Smokeless tobacco: Never  Substance Use Topics   Alcohol use: Yes    Comment: rare    FAMILY HISTORY:   Family History  Problem Relation Age of Onset   Breast cancer Neg Hx     DRUG ALLERGIES:   Allergies  Allergen Reactions   Sulfa Antibiotics Swelling    Swelling of face   Eggs Or Egg-Derived Products Nausea And Vomiting   Methocarbamol Other (See  Comments)    Lips felt swollen, but were not   Parafon Forte Dsc [Chlorzoxazone] Other (See Comments)    Unknown reaction. Patient does not remember taking this medication   Codeine Other (See Comments)    Jittery     REVIEW OF SYSTEMS:   ROS As per history of present illness. All pertinent systems were reviewed above. Constitutional, HEENT, cardiovascular, respiratory, GI, GU, musculoskeletal, neuro, psychiatric, endocrine, integumentary and hematologic systems were reviewed and are otherwise negative/unremarkable except for positive findings mentioned above in the HPI.   MEDICATIONS AT HOME:   Prior to Admission medications   Medication Sig Start Date End Date Taking? Authorizing Provider  aspirin EC 81 MG tablet Take 81 mg by mouth every evening.    [provider]  Biotin 1000 MCG tablet Take 1,000 mcg by mouth daily.    [provider]  Blood Glucose Monitoring Suppl (GMATE SMART METER KIT) DEVI by Does not apply route.    [provider]  Cholecalciferol 25 MCG (1000 UT) capsule Take 1,000 Units by mouth every evening.     [provider]  cyclobenzaprine (FLEXERIL) 10 MG tablet Take 1 tablet (10 mg total) by mouth 3 (three) times daily as needed for muscle spasms. 01/26/21   Arta Silence, MD  cycloSPORINE (RESTASIS) 0.05 % ophthalmic emulsion Place 1 drop into both eyes 2 (two) times daily.    [provider]  enalapril (VASOTEC) 5 MG tablet Take 5 mg by mouth 2 (two) times daily.  Patient not taking: Reported on 07/08/2020    [provider]  fenofibrate 160 MG tablet Take 160 mg by mouth at bedtime.     [provider]  glimepiride (AMARYL) 4 MG tablet Take 4 mg by mouth daily with breakfast. Patient not taking: Reported on 07/08/2020    [provider]  glucose blood test strip 1 each by Other route as needed for other. Use as instructed    [provider]  hydrochlorothiazide (HYDRODIURIL)  12.5 MG tablet Take 12.5 mg by mouth daily.    [provider]  Lancets Misc. KIT by Does not apply route.    [provider]  levocetirizine Harlow Ohms) 5 MG tablet Take by mouth. 04/27/19 04/26/20  [provider]  levothyroxine (SYNTHROID) 100 MCG tablet  04/27/19   [provider]  levothyroxine (SYNTHROID) 75 MCG tablet  11/27/18   [provider]  liothyronine (CYTOMEL) 5 MCG tablet Take 5 mcg by mouth daily.    [provider]  metFORMIN (GLUCOPHAGE) 1000 MG tablet Take 1,000 mg by mouth 2 (two) times daily with a meal.    [provider]  metoprolol succinate (TOPROL-XL) 100 MG 24 hr tablet Take 100 mg by mouth daily. Take with  or immediately following a meal.    [provider]  Misc Natural Products (LUTEIN 20 PO) Take 20 mg by mouth daily.    [provider]  pantoprazole (PROTONIX) 40 MG tablet Take 40 mg by mouth daily before breakfast.     [provider]  pioglitazone (ACTOS) 45 MG tablet Take 45 mg by mouth daily.    [provider]  rosuvastatin (CRESTOR) 10 MG tablet Take 10 mg by mouth at bedtime.     [provider]  venlafaxine XR (EFFEXOR-XR) 37.5 MG 24 hr capsule Take 37.5 mg by mouth daily with breakfast.    [provider]  vitamin B-12 (CYANOCOBALAMIN) 1000 MCG tablet Take 1,000 mcg by mouth daily.    [provider]      VITAL SIGNS:  Blood pressure (!) 121/49, pulse 78, temperature 97.7 F (36.5 C), resp. rate 18, height '5\' 3"'  (1.6 m), weight 72.6 kg, SpO2 98 %.  PHYSICAL EXAMINATION:  Physical Exam  GENERAL:  76 y.o.-year-old Caucasian female patient lying in the bed with no acute distress.  EYES: Pupils equal, round, reactive to light and accommodation. No scleral icterus. Extraocular muscles intact.  HEENT: Head atraumatic, normocephalic. Oropharynx and nasopharynx clear.  NECK:  Supple, no jugular venous distention. No thyroid enlargement,  no tenderness.  LUNGS: Normal breath sounds bilaterally, no wheezing, rales,rhonchi or crepitation. No use of accessory muscles of respiration.  CARDIOVASCULAR: Regular rate and rhythm, S1, S2 normal. No murmurs, rubs, or gallops.  ABDOMEN: Soft, nondistended, nontender. Bowel sounds present. No organomegaly or mass.  EXTREMITIES: No pedal edema, cyanosis, or clubbing.  NEUROLOGIC: Cranial nerves II through XII are intact. Muscle strength 5/5 in all extremities. Sensation intact. Gait not checked.  The patient was still having memory last 2 events from 7:30 PM. PSYCHIATRIC: The patient is alert and oriented x 3.  Normal affect and good eye contact. SKIN: No obvious rash, lesion, or ulcer.   LABORATORY PANEL:   CBC Recent Labs  Lab 06/17/21 2144  WBC 7.2  HGB 11.3*  HCT 34.5*  PLT 389   ------------------------------------------------------------------------------------------------------------------  Chemistries  Recent Labs  Lab 06/17/21 2144  NA 136  K 3.0*  CL 103  CO2 26  GLUCOSE 95  BUN 19  CREATININE 1.04*  CALCIUM 9.3  AST 29  ALT 26  ALKPHOS 43  BILITOT 0.5   ------------------------------------------------------------------------------------------------------------------  Cardiac Enzymes No results for input(s): TROPONINI in the last 168 hours. ------------------------------------------------------------------------------------------------------------------  RADIOLOGY:  MR BRAIN WO CONTRAST  Result Date: 06/17/2021 CLINICAL DATA:  Acute short-term memory loss EXAM: MRI HEAD WITHOUT CONTRAST TECHNIQUE: Multiplanar, multiecho pulse sequences of the brain and surrounding structures were obtained without intravenous contrast. COMPARISON:  None Available. FINDINGS: Brain: No acute infarct, mass effect or extra-axial collection. No acute or chronic hemorrhage. Normal white matter signal, parenchymal volume and CSF spaces. The midline structures are normal. Vascular:  Major flow voids are preserved. Skull and upper cervical spine: Normal calvarium and skull base. Visualized upper cervical spine and soft tissues are normal. Sinuses/Orbits:Left mastoid fluid.  Normal orbits. IMPRESSION: Normal brain MRI. Electronically Signed   By: Ulyses Jarred M.D.   On: 06/17/2021 23:30   CT HEAD CODE STROKE WO CONTRAST  Result Date: 06/17/2021 CLINICAL DATA:  Code stroke.  Sudden onset short-term memory loss EXAM: CT HEAD WITHOUT CONTRAST TECHNIQUE: Contiguous axial images were obtained from the base of the skull through the vertex without intravenous contrast. RADIATION DOSE REDUCTION: This exam was performed according to  the departmental dose-optimization program which includes automated exposure control, adjustment of the mA and/or kV according to patient size and/or use of iterative reconstruction technique. COMPARISON:  None Available. FINDINGS: Brain: No acute hemorrhage. The size and configuration of the ventricles and extra-axial CSF spaces are normal. The brain parenchyma is normal, without evidence of acute or chronic infarction. Vascular: No abnormal hyperdensity of the major intracranial arteries or dural venous sinuses. No intracranial atherosclerosis. Skull: The visualized skull base, calvarium and extracranial soft tissues are normal. Sinuses/Orbits: No fluid levels or advanced mucosal thickening of the visualized paranasal sinuses. No mastoid or middle ear effusion. The orbits are normal. ASPECTS [facility] Stroke Program Early CT Score) - Ganglionic level infarction (caudate, lentiform nuclei, internal capsule, insula, M1-M3 cortex): 7 - Supraganglionic infarction (M4-M6 cortex): 3 Total score (0-10 with 10 being normal): 10 IMPRESSION: 1. Normal head CT. 2. ASPECTS is 10. These results were called by telephone at the time of interpretation on 06/17/2021 at 9:53 pm to Dr. Tamala Julian, Who verbally acknowledged these results. Electronically Signed   By: Ulyses Jarred M.D.   On:  06/17/2021 21:57      IMPRESSION AND PLAN:  Assessment and Plan: * Transient global amnesia - Given brain MRI this could be related to a transient ischemic attack and is associated with short-term memory loss. - The patient will be admitted to an observation medical telemetry bed. - We will follow neurochecks every 4 hours for 24 hours. - She will be placed on aspirin and added Plavix. - 2D echo and carotid Doppler will be obtained. - Neurology consult, PT/OT and ST consults will be obtained. - Replaced on statin therapy. - Fasting lipids will be obtained.  Hypokalemia -Potassium will be replaced and magnesium level will be checked.  Depression - Okay to the Effexor XR and Seroquel.  GERD without esophagitis - We will continue PPI therapy.  Hypothyroidism - We will continue Synthroid.  Dyslipidemia - We will continue statin therapy and fenofibrate.  Type 2 diabetes mellitus with peripheral neuropathy (HCC) - We will place the patient on supplement coverage with NovoLog and continue her Lyrica. - We will continue Amaryl and Actos.   DVT prophylaxis: Lovenox.  Advanced Care Planning:  Code Status: full code.  Family Communication:  The plan of care was discussed in details with the patient (and family). I answered all questions. The patient agreed to proceed with the above mentioned plan. Further management will depend upon hospital course. Disposition Plan: Back to previous home environment Consults called: Neurology. All the records are reviewed and case discussed with ED provider.  Status is: Observation  I certify that at the time of admission, it is my clinical judgment that the patient will require  hospital care extending less than 2 midnights.                            Dispo: The patient is from: Home              Anticipated d/c is to: Home              Patient currently is not medically stable to d/c.              Difficult to place patient: No  Christel Mormon M.D on 06/18/2021 at 6:23 AM  [facility]   From 7 PM-7 AM, contact night-coverage [email or web address]  CC: Primary care physician; Idelle Crouch, MD

## 2021-06-17 NOTE — ED Notes (Signed)
Tele-neurologist on screen now. ?

## 2021-06-18 ENCOUNTER — Encounter: Payer: Self-pay | Admitting: Family Medicine

## 2021-06-18 ENCOUNTER — Observation Stay (HOSPITAL_BASED_OUTPATIENT_CLINIC_OR_DEPARTMENT_OTHER)
Admit: 2021-06-18 | Discharge: 2021-06-18 | Disposition: A | Discharge status: Home / Self Care | Payer: Medicare HMO | Attending: Family Medicine | Admitting: Family Medicine

## 2021-06-18 DIAGNOSIS — G459 Transient cerebral ischemic attack, unspecified: Secondary | ICD-10-CM

## 2021-06-18 DIAGNOSIS — E039 Hypothyroidism, unspecified: Secondary | ICD-10-CM | POA: Diagnosis present

## 2021-06-18 DIAGNOSIS — F32A Depression, unspecified: Secondary | ICD-10-CM | POA: Diagnosis present

## 2021-06-18 DIAGNOSIS — G454 Transient global amnesia: Principal | ICD-10-CM

## 2021-06-18 DIAGNOSIS — E663 Overweight: Secondary | ICD-10-CM | POA: Diagnosis present

## 2021-06-18 DIAGNOSIS — E876 Hypokalemia: Secondary | ICD-10-CM | POA: Diagnosis not present

## 2021-06-18 DIAGNOSIS — E1142 Type 2 diabetes mellitus with diabetic polyneuropathy: Secondary | ICD-10-CM | POA: Diagnosis not present

## 2021-06-18 DIAGNOSIS — K219 Gastro-esophageal reflux disease without esophagitis: Secondary | ICD-10-CM | POA: Diagnosis present

## 2021-06-18 DIAGNOSIS — E785 Hyperlipidemia, unspecified: Secondary | ICD-10-CM | POA: Diagnosis present

## 2021-06-18 LAB — GLUCOSE, CAPILLARY
Glucose-Capillary: 89 mg/dL (ref 70–99)
Glucose-Capillary: 120 mg/dL — ABNORMAL HIGH (ref 70–99)

## 2021-06-18 LAB — LIPID PANEL
Cholesterol: 137 mg/dL (ref 0–200)
HDL: 39 mg/dL — ABNORMAL LOW (ref >40)
LDL Cholesterol: 69 mg/dL (ref 0–99)
Total CHOL/HDL Ratio: 3.5 RATIO
Triglycerides: 146 mg/dL (ref <150)
VLDL: 29 mg/dL (ref 0–40)

## 2021-06-18 LAB — BASIC METABOLIC PANEL
Anion gap: 7 (ref 5–15)
BUN: 15 mg/dL (ref 8–23)
CO2: 24 mmol/L (ref 22–32)
Calcium: 8.6 mg/dL — ABNORMAL LOW (ref 8.9–10.3)
Chloride: 106 mmol/L (ref 98–111)
Creatinine, Ser: 0.89 mg/dL (ref 0.44–1.00)
GFR, Estimated: >60 mL/min (ref >60)
Glucose, Bld: 80 mg/dL (ref 70–99)
Potassium: 3.6 mmol/L (ref 3.5–5.1)
Sodium: 137 mmol/L (ref 135–145)

## 2021-06-18 LAB — ECHOCARDIOGRAM LIMITED BUBBLE STUDY
Area-P 1/2: 4.24 cm2
S' Lateral: 1.87 cm

## 2021-06-18 LAB — CBC
HCT: 31.3 % — ABNORMAL LOW (ref 36.0–46.0)
Hemoglobin: 10.4 g/dL — ABNORMAL LOW (ref 12.0–15.0)
MCH: 29.5 pg (ref 26.0–34.0)
MCHC: 33.2 g/dL (ref 30.0–36.0)
MCV: 88.9 fL (ref 80.0–100.0)
Platelets: 255 10*3/uL (ref 150–400)
RBC: 3.52 MIL/uL — ABNORMAL LOW (ref 3.87–5.11)
RDW: 15.2 % (ref 11.5–15.5)
WBC: 7.9 10*3/uL (ref 4.0–10.5)
nRBC: 0 % (ref 0.0–0.2)

## 2021-06-18 LAB — HEMOGLOBIN A1C
Hgb A1c MFr Bld: 5.7 % — ABNORMAL HIGH (ref 4.8–5.6)
Mean Plasma Glucose: 116.89 mg/dL

## 2021-06-18 MED ORDER — CLOPIDOGREL BISULFATE 75 MG PO TABS
75.0000 mg | ORAL_TABLET | Freq: Every day | ORAL | Status: DC
  Administered 2021-06-18: 75 mg via ORAL
  Filled 2021-06-18: qty 1

## 2021-06-18 NOTE — Discharge Summary (Signed)
Physician Discharge Summary   Patient: Katelyn Simmons MRN: [id number] DOB: [birth year]  Admit date:     06/17/2021  Discharge date: 06/18/21  Discharge Physician: Annita Brod   PCP: Idelle Crouch, MD   Recommendations at discharge:   No changes to medications.  Patient being discharged home.  Discharge Diagnoses: Principal Problem:   Transient global amnesia Active Problems:   Hypokalemia   Type 2 diabetes mellitus with peripheral neuropathy (HCC)   Depression   GERD without esophagitis   Dyslipidemia   Hypothyroidism   Overweight (BMI 25.0-29.9)  Resolved Problems:   * No resolved hospital problems. *  Hospital Course: 76 year old female with past medical history of stage III chronic kidney disease, diabetes mellitus, hypertension and hypothyroidism presented to the emergency room on the evening of 5/17 with sudden onset memory loss.  No previous history of dementia or confusion spells.  Memory loss look to be for acute events and patient appears to be unable to remember new things that were told to her.  CT and MRI were unremarkable.  No other focal neurological findings.  Patient felt to have transient global amnesia and brought in for further evaluation.  Assessment and Plan: * Transient global amnesia No evidence of CVA.  Not felt to be TIA.  Seen by neurology.  Transient global amnesia is uncommon.  Self limiting and isolated with minimal chance of any further recurrence.  No associated known triggering factors.  By following morning, patient completely back to normal with full recall.  Sister who is at bedside agrees.  Patient does not need any follow-up.  Hypokalemia Replaced as needed.  Magnesium level normal.  Type 2 diabetes mellitus with peripheral neuropathy (HCC) - We will place the patient on supplement coverage with NovoLog and continue her Lyrica. - We will continue Amaryl and Actos.  Depression - Okay to the Effexor XR and Seroquel.  GERD without  esophagitis - We will continue PPI therapy.  Dyslipidemia - We will continue statin therapy and fenofibrate.  Hypothyroidism - We will continue Synthroid.  Overweight (BMI 25.0-29.9) Meets criteria with BMI greater than 25         Consultants: Neurology Procedures performed: Grade 1 diastolic dysfunction Disposition: Home Diet recommendation:  Discharge Diet Orders (From admission, onward)     Start     Ordered   06/18/21 0000  Diet - low sodium heart healthy        06/18/21 1125           Cardiac diet DISCHARGE MEDICATION: Allergies as of 06/18/2021       Reactions   Sulfa Antibiotics Swelling   Swelling of face   Eggs Or Egg-derived Products Nausea And Vomiting   Methocarbamol Other (See Comments)   Lips felt swollen, but were not   Parafon Forte Dsc [chlorzoxazone] Other (See Comments)   Unknown reaction. Patient does not remember taking this medication   Codeine Other (See Comments)   Jittery         Medication List     TAKE these medications    aspirin EC 81 MG tablet Take 81 mg by mouth every evening.   Biotin 1000 MCG tablet Take 1,000 mcg by mouth daily.   busPIRone 10 MG tablet Commonly known as: BUSPAR Take 10 mg by mouth 2 (two) times daily.   Cholecalciferol 25 MCG (1000 UT) capsule Take 1,000 Units by mouth every evening.   cyclobenzaprine 10 MG tablet Commonly known as: FLEXERIL Take 1 tablet (10  mg total) by mouth 3 (three) times daily as needed for muscle spasms.   fenofibrate 160 MG tablet Take 160 mg by mouth at bedtime.   glucose blood test strip 1 each by Other route as needed for other. Use as instructed   [employer] by Does not apply route.   hydrochlorothiazide 25 MG tablet Commonly known as: HYDRODIURIL Take 25 mg by mouth daily.   Lancets Misc. Kit by Does not apply route.   levocetirizine 5 MG tablet Commonly known as: XYZAL Take by mouth.   levothyroxine 125 MCG tablet Commonly  known as: SYNTHROID   liothyronine 5 MCG tablet Commonly known as: CYTOMEL Take 5 mcg by mouth daily.   liraglutide 18 MG/3ML Sopn Commonly known as: VICTOZA Inject into the skin daily.   LUTEIN 20 PO Take 20 mg by mouth daily.   metFORMIN 1000 MG tablet Commonly known as: GLUCOPHAGE Take 1,000 mg by mouth 2 (two) times daily with a meal.   metoprolol succinate 100 MG 24 hr tablet Commonly known as: TOPROL-XL Take 100 mg by mouth daily. Take with or immediately following a meal.   phentermine 37.5 MG capsule Take 37.5 mg by mouth every morning.   pioglitazone 45 MG tablet Commonly known as: ACTOS Take 9 mg by mouth daily.   QUEtiapine 50 MG tablet Commonly known as: SEROQUEL Take 50 mg by mouth at bedtime.   rosuvastatin 10 MG tablet Commonly known as: CRESTOR Take by mouth at bedtime.   venlafaxine XR 75 MG 24 hr capsule Commonly known as: EFFEXOR-XR Take 75 mg by mouth daily with breakfast.   vitamin B-12 1000 MCG tablet Commonly known as: CYANOCOBALAMIN Take 1,000 mcg by mouth daily.        Discharge Exam: Filed Weights   06/17/21 2137  Weight: 72.6 kg   General: Alert and oriented x4, no acute distress Cardiovascular: Regular rate and rhythm, S1-S2 Lungs: Clear to auscultation bilaterally  Condition at discharge: good  The results of significant diagnostics from this hospitalization (including imaging, microbiology, ancillary and laboratory) are listed below for reference.   Imaging Studies: MR BRAIN WO CONTRAST  Result Date: 06/17/2021 CLINICAL DATA:  Acute short-term memory loss EXAM: MRI HEAD WITHOUT CONTRAST TECHNIQUE: Multiplanar, multiecho pulse sequences of the brain and surrounding structures were obtained without intravenous contrast. COMPARISON:  None Available. FINDINGS: Brain: No acute infarct, mass effect or extra-axial collection. No acute or chronic hemorrhage. Normal white matter signal, parenchymal volume and CSF spaces. The midline  structures are normal. Vascular: Major flow voids are preserved. Skull and upper cervical spine: Normal calvarium and skull base. Visualized upper cervical spine and soft tissues are normal. Sinuses/Orbits:Left mastoid fluid.  Normal orbits. IMPRESSION: Normal brain MRI. Electronically Signed   By: Ulyses Jarred M.D.   On: 06/17/2021 23:30   US BREAST LTD UNI RIGHT INC AXILLA  Result Date: 06/04/2021 CLINICAL DATA:  Asymmetry seen MLO view only EXAM: DIGITAL DIAGNOSTIC UNILATERAL RIGHT MAMMOGRAM WITH TOMOSYNTHESIS AND CAD; ULTRASOUND RIGHT BREAST LIMITED TECHNIQUE: Right digital diagnostic mammography and breast tomosynthesis was performed. The images were evaluated with computer-aided detection.; Targeted ultrasound examination of the right breast was performed COMPARISON:  Previous exam(s). ACR Breast Density Category b: There are scattered areas of fibroglandular density. FINDINGS: Spot compression tomosynthesis views demonstrate persistence of a low-density oval mass in the RIGHT upper outer breast at posterior depth. There is internal fat density noted on multiple tomosynthesis slices. It is immediately adjacent to a vessel. On physical  exam, no suspicious mass appreciated. Targeted ultrasound was performed of the RIGHT upper outer breast. At 11 o'clock 11 cm from the nipple, there is a echogenic mass with thin cortical stripe measuring 1 mm in thickness. It measures 17 x 8 by 14 mm. This is most consistent with a benign intramammary lymph node with a prominent echogenic hilum. IMPRESSION: There is a benign intramammary lymph node at the site of screening mammographic concern. RECOMMENDATION: Screening mammogram in one year.(Code:SM-B-01Y) I have discussed the findings and recommendations with the patient. If applicable, a reminder letter will be sent to the patient regarding the next appointment. BI-RADS CATEGORY  2: Benign. Electronically Signed   By: Valentino Saxon M.D.   On: 06/04/2021 10:03  MM  DIAG BREAST TOMO UNI RIGHT  Result Date: 06/04/2021 CLINICAL DATA:  Asymmetry seen MLO view only EXAM: DIGITAL DIAGNOSTIC UNILATERAL RIGHT MAMMOGRAM WITH TOMOSYNTHESIS AND CAD; ULTRASOUND RIGHT BREAST LIMITED TECHNIQUE: Right digital diagnostic mammography and breast tomosynthesis was performed. The images were evaluated with computer-aided detection.; Targeted ultrasound examination of the right breast was performed COMPARISON:  Previous exam(s). ACR Breast Density Category b: There are scattered areas of fibroglandular density. FINDINGS: Spot compression tomosynthesis views demonstrate persistence of a low-density oval mass in the RIGHT upper outer breast at posterior depth. There is internal fat density noted on multiple tomosynthesis slices. It is immediately adjacent to a vessel. On physical exam, no suspicious mass appreciated. Targeted ultrasound was performed of the RIGHT upper outer breast. At 11 o'clock 11 cm from the nipple, there is a echogenic mass with thin cortical stripe measuring 1 mm in thickness. It measures 17 x 8 by 14 mm. This is most consistent with a benign intramammary lymph node with a prominent echogenic hilum. IMPRESSION: There is a benign intramammary lymph node at the site of screening mammographic concern. RECOMMENDATION: Screening mammogram in one year.(Code:SM-B-01Y) I have discussed the findings and recommendations with the patient. If applicable, a reminder letter will be sent to the patient regarding the next appointment. BI-RADS CATEGORY  2: Benign. Electronically Signed   By: Valentino Saxon M.D.   On: 06/04/2021 10:03   MM 3D SCREEN BREAST BILATERAL  Result Date: 05/20/2021 CLINICAL DATA:  Screening. EXAM: DIGITAL SCREENING BILATERAL MAMMOGRAM WITH TOMOSYNTHESIS AND CAD TECHNIQUE: Bilateral screening digital craniocaudal and mediolateral oblique mammograms were obtained. Bilateral screening digital breast tomosynthesis was performed. The images were evaluated with  computer-aided detection. COMPARISON:  Previous exam(s). ACR Breast Density Category b: There are scattered areas of fibroglandular density. FINDINGS: In the right breast, a possible asymmetry warrants further evaluation. In the left breast, no findings suspicious for malignancy. IMPRESSION: Further evaluation is suggested for possible asymmetry in the right breast. RECOMMENDATION: Diagnostic mammogram and possibly ultrasound of the right breast. (Code:[id number]-63M) The patient will be contacted regarding the findings, and additional imaging will be scheduled. BI-RADS CATEGORY  0: Incomplete. Need additional imaging evaluation and/or prior mammograms for comparison. Electronically Signed   By: Ileana Roup M.D.   On: 05/20/2021 10:03   ECHOCARDIOGRAM LIMITED BUBBLE STUDY  Result Date: 06/18/2021    ECHOCARDIOGRAM LIMITED REPORT   Patient Name:   Katelyn Simmons Date of Exam: 06/18/2021 Medical Rec #:  [id number]     Height:       63.0 in Accession #:    [id number]    Weight:       160.0 lb Date of Birth:  [birth year]    BSA:  1.759 m Patient Age:    38 years      BP:           116/53 mmHg Patient Gender: F             HR:           76 bpm. Exam Location:  [facility] Procedure: 2D Echo, Color Doppler, Cardiac Doppler and Saline Contrast Bubble            Study Indications:     G45.9 TIA  History:         Patient has no prior history of Echocardiogram examinations.                  CKD; Risk Factors:Hypertension, Diabetes and Dyslipidemia.  Sonographer:     Charmayne Sheer Referring Phys:  [id number] [place] Diagnosing Phys: Ida Rogue MD  Sonographer Comments: Technically difficult study due to poor echo windows. IMPRESSIONS  1. Left ventricular ejection fraction, by estimation, is 60 to 65%. The left ventricle has normal function. The left ventricle has no regional wall motion abnormalities. Left ventricular diastolic parameters are consistent with Grade I diastolic dysfunction (impaired relaxation).  2. Right  ventricular systolic function is normal. The right ventricular size is normal.  3. The mitral valve is normal in structure. No evidence of mitral valve regurgitation. No evidence of mitral stenosis.  4. The aortic valve has an indeterminant number of cusps. Aortic valve regurgitation is not visualized. No aortic stenosis is present.  5. The inferior vena cava is normal in size with greater than 50% respiratory variability, suggesting right atrial pressure of 3 mmHg.  6. Agitated saline contrast bubble study was negative, with no evidence of any interatrial shunt. FINDINGS  Left Ventricle: Left ventricular ejection fraction, by estimation, is 60 to 65%. The left ventricle has normal function. The left ventricle has no regional wall motion abnormalities. The left ventricular internal cavity size was normal in size. There is  no left ventricular hypertrophy. Left ventricular diastolic parameters are consistent with Grade I diastolic dysfunction (impaired relaxation). Right Ventricle: The right ventricular size is normal. No increase in right ventricular wall thickness. Right ventricular systolic function is normal. Left Atrium: Left atrial size was normal in size. Right Atrium: Right atrial size was normal in size. Pericardium: There is no evidence of pericardial effusion. Mitral Valve: The mitral valve is normal in structure. No evidence of mitral valve stenosis. MV peak gradient, 6.1 mmHg. The mean mitral valve gradient is 3.0 mmHg. Tricuspid Valve: The tricuspid valve is normal in structure. Tricuspid valve regurgitation is mild . No evidence of tricuspid stenosis. Aortic Valve: The aortic valve has an indeterminant number of cusps. Aortic valve regurgitation is not visualized. No aortic stenosis is present. Pulmonic Valve: The pulmonic valve was normal in structure. Pulmonic valve regurgitation is not visualized. No evidence of pulmonic stenosis. Aorta: The aortic root is normal in size and structure. Venous: The  inferior vena cava is normal in size with greater than 50% respiratory variability, suggesting right atrial pressure of 3 mmHg. IAS/Shunts: No atrial level shunt detected by color flow Doppler. Agitated saline contrast was given intravenously to evaluate for intracardiac shunting. Agitated saline contrast bubble study was negative, with no evidence of any interatrial shunt. LEFT VENTRICLE PLAX 2D LVIDd:         2.63 cm   Diastology LVIDs:         1.87 cm   LV e' medial:    4.90  cm/s LV PW:         1.33 cm   LV E/e' medial:  20.3 LV IVS:        1.46 cm   LV e' lateral:   8.70 cm/s LVOT diam:     2.20 cm   LV E/e' lateral: 11.4 LVOT Area:     3.80 cm  RIGHT VENTRICLE RV Basal diam:  3.44 cm LEFT ATRIUM             Index        RIGHT ATRIUM           Index LA diam:        3.70 cm 2.10 cm/m   RA Area:     14.80 cm LA Vol (A2C):   36.9 ml 20.98 ml/m  RA Volume:   33.30 ml  18.93 ml/m LA Vol (A4C):   57.2 ml 32.52 ml/m LA Biplane Vol: 48.5 ml 27.58 ml/m   AORTA Ao Root diam: 3.10 cm MITRAL VALVE MV Area (PHT): 4.24 cm     SHUNTS MV Peak grad:  6.1 mmHg     Systemic Diam: 2.20 cm MV Mean grad:  3.0 mmHg MV Vmax:       1.23 m/s MV Vmean:      81.9 cm/s MV Decel Time: 179 msec MV E velocity: 99.40 cm/s MV A velocity: 128.00 cm/s MV E/A ratio:  0.78 Ida Rogue MD Electronically signed by Ida Rogue MD Signature Date/Time: 06/18/2021/11:55:00 AM    Final    CT HEAD CODE STROKE WO CONTRAST  Result Date: 06/17/2021 CLINICAL DATA:  Code stroke.  Sudden onset short-term memory loss EXAM: CT HEAD WITHOUT CONTRAST TECHNIQUE: Contiguous axial images were obtained from the base of the skull through the vertex without intravenous contrast. RADIATION DOSE REDUCTION: This exam was performed according to the departmental dose-optimization program which includes automated exposure control, adjustment of the mA and/or kV according to patient size and/or use of iterative reconstruction technique. COMPARISON:  None  Available. FINDINGS: Brain: No acute hemorrhage. The size and configuration of the ventricles and extra-axial CSF spaces are normal. The brain parenchyma is normal, without evidence of acute or chronic infarction. Vascular: No abnormal hyperdensity of the major intracranial arteries or dural venous sinuses. No intracranial atherosclerosis. Skull: The visualized skull base, calvarium and extracranial soft tissues are normal. Sinuses/Orbits: No fluid levels or advanced mucosal thickening of the visualized paranasal sinuses. No mastoid or middle ear effusion. The orbits are normal. ASPECTS [facility] Stroke Program Early CT Score) - Ganglionic level infarction (caudate, lentiform nuclei, internal capsule, insula, M1-M3 cortex): 7 - Supraganglionic infarction (M4-M6 cortex): 3 Total score (0-10 with 10 being normal): 10 IMPRESSION: 1. Normal head CT. 2. ASPECTS is 10. These results were called by telephone at the time of interpretation on 06/17/2021 at 9:53 pm to Dr. Tamala Julian, Who verbally acknowledged these results. Electronically Signed   By: Ulyses Jarred M.D.   On: 06/17/2021 21:57    Microbiology: Results for orders placed or performed in visit on 05/15/19  Microscopic Examination     Status: Abnormal   Collection Time: 05/15/19  9:27 AM   URINE  Result Value Ref Range Status   WBC, UA 0-5 0 - 5 /hpf Final   RBC None seen 0 - 2 /hpf Final   Epithelial Cells (non renal) 0-10 0 - 10 /hpf Final   Casts Present (A) None seen /lpf Final   Cast Type Hyaline casts N/A Final   Bacteria,  UA Few None seen/Few Final    Labs: CBC: Recent Labs  Lab 06/17/21 2144 06/18/21 0453  WBC 7.2 7.9  NEUTROABS 4.7  --   HGB 11.3* 10.4*  HCT 34.5* 31.3*  MCV 89.6 88.9  PLT 389 163   Basic Metabolic Panel: Recent Labs  Lab 06/17/21 2144 06/18/21 0453  NA 136 137  K 3.0* 3.6  CL 103 106  CO2 26 24  GLUCOSE 95 80  BUN 19 15  CREATININE 1.04* 0.89  CALCIUM 9.3 8.6*   Liver Function Tests: Recent Labs  Lab  06/17/21 2144  AST 29  ALT 26  ALKPHOS 43  BILITOT 0.5  PROT 7.3  ALBUMIN 3.7   CBG: Recent Labs  Lab 06/17/21 2144 06/18/21 0819 06/18/21 1141  GLUCAP 86 89 120*    Discharge time spent: less than 30 minutes.  Signed: Annita Brod, MD [facility] 06/18/2021

## 2021-06-18 NOTE — ED Provider Notes (Signed)
[facility] Provider Note    Event Date/Time   First MD Initiated Contact with Patient 06/17/21 2152     (approximate)   History   Altered Mental Status   HPI  Katelyn Simmons is a 76 y.o. female who presents to the ED for evaluation of Altered Mental Status   I reviewed neurology clinic visit from 3/15.  History of numbness and tingling for which she is on gabapentin.  Aspirin 81 is only thinner.  Patient presents to the ED for evaluation of a few hours of acute short-term memory loss.  No falls, syncope, trauma, fever   Physical Exam   Triage Vital Signs: ED Triage Vitals  Enc Vitals Group     BP 06/17/21 2127 (!) 159/63     Pulse Rate 06/17/21 2127 76     Resp 06/17/21 2127 18     Temp 06/17/21 2127 98.6 F (37 C)     Temp Source 06/17/21 2127 Oral     SpO2 06/17/21 2127 100 %     Weight 06/17/21 2137 160 lb (72.6 kg)     Height 06/17/21 2137 '5\' 3"'$  (1.6 m)     Head Circumference --      Peak Flow --      Pain Score --      Pain Loc --      Pain Edu? --      Excl. in [place]? --     Most recent vital signs: Vitals:   06/17/21 2127  BP: (!) 159/63  Pulse: 76  Resp: 18  Temp: 98.6 F (37 C)  SpO2: 100%    General: Awake, no distress.  CV:  Good peripheral perfusion.  Resp:  Normal effort.  Abd:  No distention.  MSK:  No deformity noted.  Neuro:  No focal deficits appreciated. Cranial nerves II through XII intact 5/5 strength and sensation in all 4 extremities Other:     ED Results / Procedures / Treatments   Labs (all labs ordered are listed, but only abnormal results are displayed) Labs Reviewed  CBC - Abnormal; Notable for the following components:      Result Value   RBC 3.85 (*)    Hemoglobin 11.3 (*)    HCT 34.5 (*)    All other components within normal limits  COMPREHENSIVE METABOLIC PANEL - Abnormal; Notable for the following components:   Potassium 3.0 (*)    Creatinine, Ser 1.04 (*)    GFR, Estimated 56 (*)     All other components within normal limits  PROTIME-INR  APTT  DIFFERENTIAL  LIPID PANEL  HEMOGLOBIN A1C  CBG MONITORING, ED    EKG Sinus rhythm, rate 73 bpm.  Normal axis.  Right bundle and first-degree AV block.  No STEMI.  RADIOLOGY CT head interpreted by me without evidence of acute intracranial pathology\  Official radiology report(s): MR BRAIN WO CONTRAST  Result Date: 06/17/2021 CLINICAL DATA:  Acute short-term memory loss EXAM: MRI HEAD WITHOUT CONTRAST TECHNIQUE: Multiplanar, multiecho pulse sequences of the brain and surrounding structures were obtained without intravenous contrast. COMPARISON:  None Available. FINDINGS: Brain: No acute infarct, mass effect or extra-axial collection. No acute or chronic hemorrhage. Normal white matter signal, parenchymal volume and CSF spaces. The midline structures are normal. Vascular: Major flow voids are preserved. Skull and upper cervical spine: Normal calvarium and skull base. Visualized upper cervical spine and soft tissues are normal. Sinuses/Orbits:Left mastoid fluid.  Normal orbits. IMPRESSION: Normal brain MRI. Electronically  Signed   By: Ulyses Jarred M.D.   On: 06/17/2021 23:30   CT HEAD CODE STROKE WO CONTRAST  Result Date: 06/17/2021 CLINICAL DATA:  Code stroke.  Sudden onset short-term memory loss EXAM: CT HEAD WITHOUT CONTRAST TECHNIQUE: Contiguous axial images were obtained from the base of the skull through the vertex without intravenous contrast. RADIATION DOSE REDUCTION: This exam was performed according to the departmental dose-optimization program which includes automated exposure control, adjustment of the mA and/or kV according to patient size and/or use of iterative reconstruction technique. COMPARISON:  None Available. FINDINGS: Brain: No acute hemorrhage. The size and configuration of the ventricles and extra-axial CSF spaces are normal. The brain parenchyma is normal, without evidence of acute or chronic infarction.  Vascular: No abnormal hyperdensity of the major intracranial arteries or dural venous sinuses. No intracranial atherosclerosis. Skull: The visualized skull base, calvarium and extracranial soft tissues are normal. Sinuses/Orbits: No fluid levels or advanced mucosal thickening of the visualized paranasal sinuses. No mastoid or middle ear effusion. The orbits are normal. ASPECTS [facility] Stroke Program Early CT Score) - Ganglionic level infarction (caudate, lentiform nuclei, internal capsule, insula, M1-M3 cortex): 7 - Supraganglionic infarction (M4-M6 cortex): 3 Total score (0-10 with 10 being normal): 10 IMPRESSION: 1. Normal head CT. 2. ASPECTS is 10. These results were called by telephone at the time of interpretation on 06/17/2021 at 9:53 pm to Dr. Tamala Julian, Who verbally acknowledged these results. Electronically Signed   By: Ulyses Jarred M.D.   On: 06/17/2021 21:57    PROCEDURES and INTERVENTIONS:  .Critical Care Performed by: Vladimir Crofts, MD Authorized by: Vladimir Crofts, MD   Critical care provider statement:    Critical care time (minutes):  30   Critical care time was exclusive of:  Separately billable procedures and treating other patients   Critical care was necessary to treat or prevent imminent or life-threatening deterioration of the following conditions:  CNS failure or compromise   Critical care was time spent personally by me on the following activities:  Development of treatment plan with patient or surrogate, discussions with consultants, evaluation of patient's response to treatment, examination of patient, ordering and review of laboratory studies, ordering and review of radiographic studies, ordering and performing treatments and interventions, pulse oximetry, re-evaluation of patient's condition and review of old charts .1-3 Lead EKG Interpretation Performed by: Vladimir Crofts, MD Authorized by: Vladimir Crofts, MD     Interpretation: normal     ECG rate:  70   ECG rate assessment:  normal     Rhythm: sinus rhythm     Ectopy: none     Conduction: normal    Medications  fenofibrate tablet 160 mg (has no administration in time range)  hydrochlorothiazide (HYDRODIURIL) tablet 12.5 mg (has no administration in time range)  metoprolol succinate (TOPROL-XL) 24 hr tablet 100 mg (has no administration in time range)  rosuvastatin (CRESTOR) tablet 10 mg (has no administration in time range)  venlafaxine XR (EFFEXOR-XR) 24 hr capsule 37.5 mg (has no administration in time range)  liothyronine (CYTOMEL) tablet 5 mcg (has no administration in time range)  pioglitazone (ACTOS) tablet 45 mg (has no administration in time range)  pantoprazole (PROTONIX) EC tablet 40 mg (has no administration in time range)  vitamin B-12 (CYANOCOBALAMIN) tablet 1,000 mcg (has no administration in time range)   stroke: early stages of recovery book (has no administration in time range)  0.9 %  sodium chloride infusion (has no administration in time range)  acetaminophen (TYLENOL)  tablet 650 mg (has no administration in time range)    Or  acetaminophen (TYLENOL) 160 MG/5ML solution 650 mg (has no administration in time range)    Or  acetaminophen (TYLENOL) suppository 650 mg (has no administration in time range)  senna-docusate (Senokot-S) tablet 1 tablet (has no administration in time range)  enoxaparin (LOVENOX) injection 40 mg (has no administration in time range)  aspirin EC tablet 81 mg (has no administration in time range)  ondansetron (ZOFRAN) injection 4 mg (has no administration in time range)  traZODone (DESYREL) tablet 25 mg (has no administration in time range)  levothyroxine (SYNTHROID) tablet 100 mcg (has no administration in time range)  insulin aspart (novoLOG) injection 0-9 Units (has no administration in time range)  potassium chloride (KLOR-CON) packet 40 mEq (has no administration in time range)  potassium chloride (KLOR-CON) packet 40 mEq (has no administration in time range)   sodium chloride flush (NS) 0.9 % injection 3 mL (3 mLs Intravenous Given 06/17/21 2240)  LORazepam (ATIVAN) injection 1 mg (1 mg Intravenous Given 06/17/21 2239)     IMPRESSION / MDM / ASSESSMENT AND PLAN / ED COURSE  I reviewed the triage vital signs and the nursing notes.  Differential diagnosis includes, but is not limited to, stroke, seizure, transient global amnesia  {Patient presents with symptoms of an acute illness or injury that is potentially life-threatening.  76 year old woman presents to the ED with acute short-term memory loss concerning for the possibility of stroke.  Treat protocols initiated and telemetry neurology is consulted.  NIH of 1 due to her short-term memory loss, but has intact strength and sensation otherwise, no evidence of cranial nerve deficits otherwise.  Blood work with hypokalemia.  No leukocytosis.  No ICH on CT head.  No indications for TNK, per neurology consultation and patient declines this medication anyways.  We will consult with medicine for admission and stroke work-up  Clinical Course as of 06/18/21 0008  Wed Jun 17, 2021  2221 I consult with Dr. Bridgett Larsson, telemetry specialists neurology who recommends inpatient work-up and MRI [DS]    Clinical Course User Index [DS] Vladimir Crofts, MD     FINAL CLINICAL IMPRESSION(S) / ED DIAGNOSES   Final diagnoses:  Short-term memory loss  Hypokalemia     Rx / DC Orders   ED Discharge Orders     None        Note:  This document was prepared using Dragon voice recognition software and may include unintentional dictation errors.   Vladimir Crofts, MD 06/18/21 0010

## 2021-06-18 NOTE — Assessment & Plan Note (Signed)
-   We will continue PPI therapy 

## 2021-06-18 NOTE — Assessment & Plan Note (Signed)
-   We will continue statin therapy and fenofibrate. 

## 2021-06-18 NOTE — Assessment & Plan Note (Signed)
Meets criteria with BMI greater than 25 

## 2021-06-18 NOTE — Assessment & Plan Note (Addendum)
-   We will place the patient on supplement coverage with NovoLog and continue her Lyrica. - We will continue Amaryl and Actos.

## 2021-06-18 NOTE — Progress Notes (Signed)
*  PRELIMINARY RESULTS* Echocardiogram 2D Echocardiogram has been performed.  Katelyn Simmons 06/18/2021, 9:08 AM

## 2021-06-18 NOTE — Evaluation (Signed)
Occupational Therapy Evaluation Patient Details Name: Katelyn Simmons MRN: [id number] DOB: 09-Jan-1946 Today's Date: 06/18/2021   History of Present Illness Patient is a 76 year old female who reported to [facility] ED on 06/17/21 with reports of sudden onset of short term memory loss per son. Patient has a PMH (+) for  hypertension, dyslipidemia, hypothyroidism, stage III chronic kidney disease, type 2 diabetes mellitus, GERD, and fibromyalgia   Clinical Impression   Pt supine in bed and agreeable to OT evaluation. Pt reports living at home alone without use of AD. She has a ramp to enter her home and is very active and independent at baseline. Pt demonstrates ability to perform bed mobility without assistance. Pt stands and ambulates without use of AD for grooming tasks in standing, toileting, and ambulation in room. Pt reporting she feels at her baseline and has no further concerns. Pt seated in recliner chair at end of session. All needs within reach and pt with no further OT concerns at this time. OT to SIGN OFF.      Recommendations for follow up therapy are one component of a multi-disciplinary discharge planning process, led by the attending physician.  Recommendations may be updated based on patient status, additional functional criteria and insurance authorization.   Follow Up Recommendations  No OT follow up    Assistance Recommended at Discharge None     Functional Status Assessment  Patient has not had a recent decline in their functional status  Equipment Recommendations  None recommended by OT       Precautions / Restrictions Precautions Precautions: Fall Restrictions Weight Bearing Restrictions: No      Mobility Bed Mobility Overal bed mobility: Independent                  Transfers Overall transfer level: Modified independent Equipment used: None                      Balance Overall balance assessment: Modified Independent                                          ADL either performed or assessed with clinical judgement   ADL Overall ADL's : Independent                                             Vision Patient Visual Report: No change from baseline              Pertinent Vitals/Pain Pain Assessment Pain Assessment: No/denies pain     Hand Dominance Right   Extremity/Trunk Assessment Upper Extremity Assessment Upper Extremity Assessment: Overall WFL for tasks assessed   Lower Extremity Assessment Lower Extremity Assessment: Overall WFL for tasks assessed       Communication Communication Communication: No difficulties   Cognition Arousal/Alertness: Awake/alert Behavior During Therapy: WFL for tasks assessed/performed Overall Cognitive Status: Within Functional Limits for tasks assessed                                                  Home Living Family/patient expects to be discharged to:: Private residence Living Arrangements:  Alone Available Help at Discharge: Family;Available 24 hours/day Type of Home: House Home Access: Ramped entrance     Home Layout: One level     Bathroom Shower/Tub: Teacher, early years/pre: Handicapped height Bathroom Accessibility: Yes   Home Equipment: Conservation officer, nature (2 wheels);Cane - single point          Prior Functioning/Environment Prior Level of Function : Independent/Modified Independent                         AM-PAC OT "6 Clicks" Daily Activity     Outcome Measure Help from another person eating meals?: None Help from another person taking care of personal grooming?: None Help from another person toileting, which includes using toliet, bedpan, or urinal?: None Help from another person bathing (including washing, rinsing, drying)?: None Help from another person to put on and taking off regular upper body clothing?: None Help from another person to put on and taking off regular lower  body clothing?: None 6 Click Score: 24   End of Session Nurse Communication: Mobility status  Activity Tolerance: Patient tolerated treatment well Patient left: in bed;with call bell/phone within reach                   Time: 1016-1033 OT Time Calculation (min): 17 min Charges:  OT General Charges $OT Visit: 1 Visit OT Evaluation $OT Eval Low Complexity: 1 Low  Darleen Crocker, MS, OTR/L , CBIS ascom [phone]  06/18/21, 11:54 AM

## 2021-06-18 NOTE — Evaluation (Signed)
Physical Therapy Evaluation Patient Details Name: NATASA STIGALL MRN: [id number] DOB: 1945/12/01 Today's Date: 06/18/2021  History of Present Illness  Patient is a 76 year old female who reported to [facility] ED on 06/17/21 with reports of sudden onset of short term memory loss per son. Patient has a PMH (+) for  hypertension, dyslipidemia, hypothyroidism, stage III chronic kidney disease, type 2 diabetes mellitus, GERD, and fibromyalgia   Clinical Impression  Physical Therapy Evaluation completed this date. Patient tolerated session well and was agreeable to treatment. Upon entering room patient was seated in the recliner with family present. Patient reports having a RW and SPC at home, however was Mod I with no AD for all mobility and ADLs at baseline. Patient states she lives in a 1 story home alone with a ramped entrance. Throughout evaluation patient demonstrated at/near baseline level of function. Bed mobility not assessed as patient started and ended session in recliner, however sit to stand transfers and ambulation were completed Mod I. Patient initially ambulated with IV pole, however progressed to no assistance with no LOB noted. Patient was left in room with all needs met and in reach with family present. Patient does not require additional skilled physical therapy. Signing off.      Recommendations for follow up therapy are one component of a multi-disciplinary discharge planning process, led by the attending physician.  Recommendations may be updated based on patient status, additional functional criteria and insurance authorization.  Follow Up Recommendations No PT follow up    Assistance Recommended at Discharge PRN  Patient can return home with the following       Equipment Recommendations None recommended by PT  Recommendations for Other Services       Functional Status Assessment Patient has not had a recent decline in their functional status     Precautions / Restrictions  Precautions Precautions: Fall Restrictions Weight Bearing Restrictions: No      Mobility  Bed Mobility               General bed mobility comments: Not assessed patient started and ended session in recliner Patient Response: Cooperative  Transfers Overall transfer level: Modified independent Equipment used: None                    Ambulation/Gait Ambulation/Gait assistance: Modified independent (Device/Increase time) Gait Distance (Feet): 320 Feet Assistive device: None, IV Pole Gait Pattern/deviations: WFL(Within Functional Limits), Step-through pattern, Narrow base of support       General Gait Details: patient is ambulating at her baseline level  Stairs            Wheelchair Mobility    Modified Rankin (Stroke Patients Only)       Balance Overall balance assessment: Modified Independent                                           Pertinent Vitals/Pain Pain Assessment Pain Assessment: No/denies pain    Home Living Family/patient expects to be discharged to:: Private residence Living Arrangements: Alone   Type of Home: House Home Access: Ramped entrance       Home Layout: One level Home Equipment: Conservation officer, nature (2 wheels);Cane - single point      Prior Function Prior Level of Function : Independent/Modified Independent  Hand Dominance   Dominant Hand: Right    Extremity/Trunk Assessment   Upper Extremity Assessment Upper Extremity Assessment: Overall WFL for tasks assessed    Lower Extremity Assessment Lower Extremity Assessment: Overall WFL for tasks assessed       Communication   Communication: No difficulties  Cognition Arousal/Alertness: Awake/alert Behavior During Therapy: WFL for tasks assessed/performed Overall Cognitive Status: Within Functional Limits for tasks assessed                                 General Comments: Alert and oriented x3- self  location and situation        General Comments      Exercises Other Exercises Other Exercises: patient educated on role of PT in acute care setting, fall risk, and d/c recommendations   Assessment/Plan    PT Assessment Patient does not need any further PT services  PT Problem List         PT Treatment Interventions      PT Goals (Current goals can be found in the Care Plan section)  Acute Rehab PT Goals Patient Stated Goal: to go home PT Goal Formulation: With patient Time For Goal Achievement: 07/02/21 Potential to Achieve Goals: Good    Frequency       Co-evaluation               AM-PAC PT "6 Clicks" Mobility  Outcome Measure Help needed turning from your back to your side while in a flat bed without using bedrails?: None Help needed moving from lying on your back to sitting on the side of a flat bed without using bedrails?: None Help needed moving to and from a bed to a chair (including a wheelchair)?: None Help needed standing up from a chair using your arms (e.g., wheelchair or bedside chair)?: None Help needed to walk in hospital room?: None Help needed climbing 3-5 steps with a railing? : None 6 Click Score: 24    End of Session Equipment Utilized During Treatment: Gait belt Activity Tolerance: Patient tolerated treatment well Patient left: in chair;with call bell/phone within reach;with family/visitor present Nurse Communication: Mobility status      Time: 1660-6004 PT Time Calculation (min) (ACUTE ONLY): 10 min   Charges:   PT Evaluation $PT Eval Low Complexity: 1 Low          Iva Boop, PT  06/18/21. 11:15 AM

## 2021-06-18 NOTE — Assessment & Plan Note (Signed)
-   We will continue Synthroid. 

## 2021-06-18 NOTE — Assessment & Plan Note (Addendum)
No evidence of CVA.  Not felt to be TIA.  Seen by neurology.  Transient global amnesia is uncommon.  Self limiting and isolated with minimal chance of any further recurrence.  No associated known triggering factors.  By following morning, patient completely back to normal with full recall.  Sister who is at bedside agrees.  Patient does not need any follow-up.

## 2021-06-18 NOTE — Progress Notes (Signed)
Code stroke activated at 2150. Dr Bridgett Larsson on screen at 2156

## 2021-06-18 NOTE — Assessment & Plan Note (Addendum)
Replaced as needed.  Magnesium level normal.

## 2021-06-18 NOTE — Hospital Course (Signed)
76 year old female with past medical history of stage III chronic kidney disease, diabetes mellitus, hypertension and hypothyroidism presented to the emergency room on the evening of 5/17 with sudden onset memory loss.  No previous history of dementia or confusion spells.  Memory loss look to be for acute events and patient appears to be unable to remember new things that were told to her.  CT and MRI were unremarkable.  No other focal neurological findings.  Patient felt to have transient global amnesia and brought in for further evaluation.

## 2021-06-18 NOTE — Assessment & Plan Note (Signed)
-   Okay to the Effexor XR and Seroquel.

## 2021-06-18 NOTE — Consult Note (Signed)
Neurology Consultation Reason for Consult: TGA Referring Physician: Lyman Speller  CC: memory issues  History is obtained from:patient   HPI: Katelyn Simmons is a 76 y.o. female who was in her normal state of health until yesterday.  She went out to eat normally, and then remembers going home and sitting down in her living room.  She has no memory after this.  Her son states that she called him and said that she did not feel right and so he went over there.  She was asking the same question over and over again, and seemed not to make memories.  She was clearheaded other than not forming new memories, with no other symptoms.  She continually repeated herself, asking the same questions even after arriving to the hospital throughout the night until this morning.  This morning, she seems to have improved, and is making new memories.  Her son also points out that she had forgotten several purchases she had previously made while she was experiencing the episode, but now she has remembered having made the purchases.   Past Medical History:  Diagnosis Date   Abnormal finding on EKG    Anemia    pernicious anemia   Anxiety    Arthritis    everywhere   B12 deficiency    Barrett's esophagus    CKD (chronic kidney disease)    stage III ckd   Collagen vascular disease (HCC)    RA   Depression    Diabetes mellitus without complication (HCC)    Dysphagia    Dyspnea    with exertion   Dysrhythmia    sinus arrythmia or just stops momentarily, per patient   Esophageal stricture    Fatty liver    GERD (gastroesophageal reflux disease)    Goiter, nontoxic, multinodular    History of kidney stones    Hx of repair of left rotator cuff    Hyperlipidemia    Hypertension    Hypothyroidism    Neuromuscular disorder (HCC)    fibromyalgia   Obesity    Pain    Rotator cuff tear arthropathy of both shoulders    Shingles 2008   Stroke [facility])    mini strokes about 20 years ago.  some memory loss  residual   Throat pain      Family History  Problem Relation Age of Onset   Breast cancer Neg Hx      Social History:  reports that she has never smoked. She has never used smokeless tobacco. She reports current alcohol use. She reports that she does not use drugs.   Exam: Current vital signs: BP (!) 116/53 (BP Location: Left Arm)   Pulse 76   Temp 98.2 F (36.8 C)   Resp 18   Ht '5\' 3"'$  (1.6 m)   Wt 72.6 kg   SpO2 100%   BMI 28.34 kg/m  Vital signs in last 24 hours: Temp:  [97.7 F (36.5 C)-98.6 F (37 C)] 98.2 F (36.8 C) (05/18 0725) Pulse Rate:  [76-80] 76 (05/18 0725) Resp:  [18] 18 (05/18 0725) BP: (116-159)/(49-70) 116/53 (05/18 0725) SpO2:  [98 %-100 %] 100 % (05/18 0725) Weight:  [72.6 kg] 72.6 kg (05/17 2137)   Physical Exam  Constitutional: Appears well-developed and well-nourished.  Psych: Affect appropriate to situation Eyes: No scleral injection HENT: No OP obstruction MSK: no joint deformities.  Cardiovascular: Normal rate and regular rhythm.  Respiratory: Effort normal, non-labored breathing GI: Soft.  No distension. There is  no tenderness.  Skin: WDI  Neuro: Mental Status: Patient is awake, alert, oriented to person, place, month, year, and situation. Patient is able to give a clear and coherent history. No signs of aphasia or neglect She remembers me after I stepped out of the room and return a little while later. Cranial Nerves: II: Visual Fields are full. Pupils are equal, round, and reactive to light.   III,IV, VI: EOMI without ptosis or diploplia.  V: Facial sensation is symmetric to temperature VII: Facial movement is symmetric.  VIII: hearing is intact to voice X: Uvula elevates symmetrically XI: Shoulder shrug is symmetric. XII: tongue is midline without atrophy or fasciculations.  Motor: Tone is normal. Bulk is normal. 5/5 strength was present in all four extremities.  Sensory: Sensation is symmetric to light touch and  temperature in the arms and legs. Cerebellar: FNF and HKS are intact bilaterally      I have reviewed labs in epic and the results pertinent to this consultation are: LDL 79 BMP is unremarkable  I have reviewed the images obtained: MRI brain is negative  Impression: 76 year old female with characteristic presentation of transient global amnesia.  Given the characteristic presentation, and negative MRI during her episode, I do not think cerebrovascular disease is a significant consideration.  I also do not think prolonged status epilepticus limited to memory deficit without associated confusion would be at all likely.  There is a small percentage of patients who experience recurrence, but this typically occurs years down the line.  At this time, no further recommendations, no further follow-up is needed unless patient has recurrence of symptoms.  Recommendations: 1) no further recommendations at this time, further testing needed only if she develops further symptoms. 2) no specific restrictions recommended at this time.   Roland Rack, MD [facility] [phone]  If 7pm- 7am, please page neurology on call as listed in [place].

## 2021-06-22 NOTE — Consult Note (Addendum)
[facility] TeleNeurology Consult Services   Patient Name:   Katelyn Simmons, Katelyn Simmons Date of Birth:   October 02, 1945 Identification Number:   MRN - [id number] Date of Service:   06/17/2021 21:52:10  Diagnosis:       R41.82 - AMS (Altered Mental Status)  Impression:      Patient presents with isolated confusion and short term memory issues suggestive of transient global amnesia. CTH is negative. NIHSS is 2 for orientation questions but has no speech or physical limitation. Differential diagnosis includes encephalopathy due to toxic/metabolic/infectious causes. Stroke is also possible but would be small stroke given low NIH. LVO not suspected. We discussed the risk vs benefits of IV thrombolytics as she is within the window and she declined the medication after appropriate discussion. given these deficits, not a candidate for NIR consideration.  Our recommendations are outlined below.  Recommendations:        Stroke/Telemetry Floor       Neuro Checks       Bedside Swallow Eval       DVT Prophylaxis       IV Fluids, Normal Saline       Head of Bed 30 Degrees       Euglycemia and Avoid Hyperthermia (PRN Acetaminophen)       Initiate or continue Aspirin 325 MG daily       Antihypertensives PRN if Blood pressure is greater than 220/120 or there is a concern for End organ damage/contraindications for permissive HTN. If blood pressure is greater than 220/120 give labetalol PO or IV or Vasotec IV with a goal of 15% reduction in BP during the first 24 hours.       admit for further stroke workup  Per facility request will defer further work up, management, and referrals to inpatient service, inclusive of inpatient neurology consult.  Sign Out:       Discussed with Emergency Department Provider    ------------------------------------------------------------------------------  Advanced Imaging: Advanced Imaging Deferred because:  Non-disabling symptoms as verified by the patient; no  cortical signs so not consistent with LVO   Metrics: Last Known Well: 06/17/2021 19:45:00 [facility] Notification Time: 06/17/2021 21:52:09 Arrival Time: 06/17/2021 21:42:00 Stamp Time: 06/17/2021 21:52:10 Initial Response Time: 06/17/2021 21:55:41 Symptoms: memory difficulty. Initial patient interaction: 06/17/2021 21:58:00 NIHSS Assessment Completed: 06/17/2021 22:00:00 Patient is not a candidate for Thrombolytic. Thrombolytic Medical Decision: 06/17/2021 22:06:12 Patient was not deemed candidate for Thrombolytic because of following reasons: No disabling symptoms.  As per Radiologist CT Showed "normal head CT"  Primary Provider Notified of Diagnostic Impression and Management Plan on: 06/17/2021 22:23:39    ------------------------------------------------------------------------------  History of Present Illness: Patient is a 76 year old Female.  Patient was brought by private transportation with symptoms of memory difficulty. They were having lunch, her family left 7:45 and then she called at 8:30 and said she was having short term memory issues, she couldn't remember where the tomatoes came from. denies any headache or h/o migraine. She has not been sick recently. She is normally independent. Aside from the memory trouble, she feels good.   Past Medical History:      Hypertension      Diabetes Mellitus      Hyperlipidemia      There is no history of Stroke      There is no history of Migraine Headaches [place] PMH:  anemia  sciatica  back pain  Medications:  No Anticoagulant use  Antiplatelet use: Yes asa occasionally Reviewed EMR for current medications  Allergies:  Reviewed  Social History: Smoking: No Alcohol Use: Yes Drug Use: No  Family History:  There is no family history of premature cerebrovascular disease pertinent to this consultation  ROS : 14 Points Review of Systems was performed and was negative except mentioned in HPI.  Past  Surgical History: There Is No Surgical History Contributory To Today's Visit    Examination: BP(121/95), Pulse(78), Blood Glucose(83) 1A: Level of Consciousness - Alert; keenly responsive + 0 1B: Ask Month and Age - Could Not Answer Either Question Correctly + 2 1C: Blink Eyes & Squeeze Hands - Performs Both Tasks + 0 2: Test Horizontal Extraocular Movements - Normal + 0 3: Test Visual Fields - No Visual Loss + 0 4: Test Facial Palsy (Use Grimace if Obtunded) - Normal symmetry + 0 5A: Test Left Arm Motor Drift - No Drift for 10 Seconds + 0 5B: Test Right Arm Motor Drift - No Drift for 10 Seconds + 0 6A: Test Left Leg Motor Drift - No Drift for 5 Seconds + 0 6B: Test Right Leg Motor Drift - No Drift for 5 Seconds + 0 7: Test Limb Ataxia (FNF/Heel-Shin) - No Ataxia + 0 8: Test Sensation - Normal; No sensory loss + 0 9: Test Language/Aphasia - Normal; No aphasia + 0 10: Test Dysarthria - Normal + 0 11: Test Extinction/Inattention - No abnormality + 0  NIHSS Score: 2  NIHSS Free Text : gait is intact  Pre-Morbid Modified Rankin Scale: 1 Points = No significant disability despite symptoms; able to carry out all usual duties and activities   Patient/Family was informed the Neurology Consult would occur via TeleHealth consult by way of interactive audio and video telecommunications and consented to receiving care in this manner.   Patient is being evaluated for possible acute neurologic impairment and high probability of imminent or life-threatening deterioration. I spent total of 30 minutes providing care to this patient, including time for face to face visit via telemedicine, review of medical records, imaging studies and discussion of findings with providers, the patient and/or family.   Dr Alinda Dooms   [facility] [phone]   Case [id number]

## 2021-06-26 DIAGNOSIS — Z79899 Other long term (current) drug therapy: Secondary | ICD-10-CM | POA: Diagnosis not present

## 2021-06-26 DIAGNOSIS — G454 Transient global amnesia: Secondary | ICD-10-CM | POA: Diagnosis not present

## 2021-07-16 DIAGNOSIS — M545 Low back pain, unspecified: Secondary | ICD-10-CM | POA: Diagnosis not present

## 2021-07-16 DIAGNOSIS — R202 Paresthesia of skin: Secondary | ICD-10-CM | POA: Diagnosis not present

## 2021-07-16 DIAGNOSIS — R200 Anesthesia of skin: Secondary | ICD-10-CM | POA: Diagnosis not present

## 2021-07-16 DIAGNOSIS — G454 Transient global amnesia: Secondary | ICD-10-CM | POA: Diagnosis not present

## 2021-07-16 DIAGNOSIS — E118 Type 2 diabetes mellitus with unspecified complications: Secondary | ICD-10-CM | POA: Diagnosis not present

## 2021-07-16 DIAGNOSIS — G8929 Other chronic pain: Secondary | ICD-10-CM | POA: Diagnosis not present

## 2021-07-16 DIAGNOSIS — R531 Weakness: Secondary | ICD-10-CM | POA: Diagnosis not present

## 2021-07-16 DIAGNOSIS — R2689 Other abnormalities of gait and mobility: Secondary | ICD-10-CM | POA: Diagnosis not present

## 2021-07-20 DIAGNOSIS — M069 Rheumatoid arthritis, unspecified: Secondary | ICD-10-CM | POA: Diagnosis not present

## 2021-07-20 DIAGNOSIS — Z79899 Other long term (current) drug therapy: Secondary | ICD-10-CM | POA: Diagnosis not present

## 2021-08-10 DIAGNOSIS — Z8673 Personal history of transient ischemic attack (TIA), and cerebral infarction without residual deficits: Secondary | ICD-10-CM | POA: Diagnosis not present

## 2021-08-10 DIAGNOSIS — I6523 Occlusion and stenosis of bilateral carotid arteries: Secondary | ICD-10-CM | POA: Diagnosis not present

## 2021-08-10 DIAGNOSIS — G454 Transient global amnesia: Secondary | ICD-10-CM | POA: Diagnosis not present

## 2021-08-12 ENCOUNTER — Other Ambulatory Visit: Payer: Self-pay

## 2021-08-12 NOTE — Patient Outreach (Signed)
[facility]) Care Management  08/12/2021  Katelyn Simmons [birth year] [id number]   Telephone call to patient for follow up.Patient doing ok.  Some problems with arthritis swelling. Patient to follow up with providers as needed.  Diabetes management continues.  Care Plan : RN Care Manager Plan of Care  Updates made by Jon Billings, RN since 08/12/2021 12:00 AM     Problem: Chronic Disease Management and Care Coordination Needs Diabetes   Priority: High     Long-Range Goal: Development of Plan of Care for management of diabetes   Start Date: 12/10/2020  Expected End Date: 12/31/2021  This Visit's Progress: On track  Priority: High  Note:   Current Barriers:  Chronic Disease Management support and education needs related to DMII  RNCM Clinical Goal(s):  Patient will verbalize understanding of plan for management of DMII take all medications exactly as prescribed and will call provider for medication related questions through collaboration with RN Care manager, provider, and care team.   Interventions: Education in management of diabetes Inter-disciplinary care team collaboration (see longitudinal plan of care) Evaluation of current treatment plan related to  self management and patient's adherence to plan as established by provider   Diabetes Interventions: Assessed patient's understanding of A1c goal: <7% Provided education to patient about basic DM disease process; Reviewed medications with patient and discussed importance of medication adherence; Discussed plans with patient for ongoing care management follow up and provided patient with direct contact information for care management team; Lab Results  Component Value Date   HGBA1C 5.7 (H) 10/25/2018    08/12/21 Patient doing okay.  Some swelling with her arthritis.  Patient sugars doing good.  This am is 98.    Patient Goals/Self-Care Activities: Patient will self administer medications as  prescribed Patient will attend all scheduled provider appointments Reviewed importance of limiting carbs such as rice, potatoes, breads, and pasta Patient will follow low carbohydrate diet Patient will check blood sugars as prescribed  Follow Up Plan:  Telephone follow up appointment with care management team member scheduled for:  August The patient has been provided with contact information for the care management team and has been advised to call with any health related questions or concerns.      Plan: Follow-up: Patient agrees to Care Plan and Follow-up. Follow-up in August.  Abdulhamid Olgin J Mahmoud Blazejewski, RN, MSN [employer] Care Management Coordinator Direct Line [phone] Toll Free: [phone]  Fax: [phone]

## 2021-08-12 NOTE — Patient Instructions (Signed)
Patient Goals/Self-Care Activities: Patient will self administer medications as prescribed Patient will attend all scheduled provider appointments Reviewed importance of limiting carbs such as rice, potatoes, breads, and pasta Patient will follow low carbohydrate diet Patient will check blood sugars as prescribed

## 2021-08-17 DIAGNOSIS — E118 Type 2 diabetes mellitus with unspecified complications: Secondary | ICD-10-CM | POA: Diagnosis not present

## 2021-08-17 DIAGNOSIS — E538 Deficiency of other specified B group vitamins: Secondary | ICD-10-CM | POA: Diagnosis not present

## 2021-08-17 DIAGNOSIS — I10 Essential (primary) hypertension: Secondary | ICD-10-CM | POA: Diagnosis not present

## 2021-08-17 DIAGNOSIS — E890 Postprocedural hypothyroidism: Secondary | ICD-10-CM | POA: Diagnosis not present

## 2021-08-17 DIAGNOSIS — E782 Mixed hyperlipidemia: Secondary | ICD-10-CM | POA: Diagnosis not present

## 2021-08-17 DIAGNOSIS — Z79899 Other long term (current) drug therapy: Secondary | ICD-10-CM | POA: Diagnosis not present

## 2021-08-20 DIAGNOSIS — R569 Unspecified convulsions: Secondary | ICD-10-CM | POA: Diagnosis not present

## 2021-08-24 DIAGNOSIS — M2550 Pain in unspecified joint: Secondary | ICD-10-CM | POA: Diagnosis not present

## 2021-08-24 DIAGNOSIS — E079 Disorder of thyroid, unspecified: Secondary | ICD-10-CM | POA: Diagnosis not present

## 2021-08-24 DIAGNOSIS — R1310 Dysphagia, unspecified: Secondary | ICD-10-CM | POA: Diagnosis not present

## 2021-08-24 DIAGNOSIS — Z0000 Encounter for general adult medical examination without abnormal findings: Secondary | ICD-10-CM | POA: Diagnosis not present

## 2021-08-24 DIAGNOSIS — E119 Type 2 diabetes mellitus without complications: Secondary | ICD-10-CM | POA: Diagnosis not present

## 2021-08-24 DIAGNOSIS — I10 Essential (primary) hypertension: Secondary | ICD-10-CM | POA: Diagnosis not present

## 2021-08-24 DIAGNOSIS — E538 Deficiency of other specified B group vitamins: Secondary | ICD-10-CM | POA: Diagnosis not present

## 2021-08-24 DIAGNOSIS — Z79899 Other long term (current) drug therapy: Secondary | ICD-10-CM | POA: Diagnosis not present

## 2021-08-24 DIAGNOSIS — E785 Hyperlipidemia, unspecified: Secondary | ICD-10-CM | POA: Diagnosis not present

## 2021-08-26 DIAGNOSIS — M069 Rheumatoid arthritis, unspecified: Secondary | ICD-10-CM | POA: Diagnosis not present

## 2021-08-26 DIAGNOSIS — Z79899 Other long term (current) drug therapy: Secondary | ICD-10-CM | POA: Diagnosis not present

## 2021-08-27 ENCOUNTER — Other Ambulatory Visit: Payer: Self-pay | Admitting: Internal Medicine

## 2021-08-27 DIAGNOSIS — R1310 Dysphagia, unspecified: Secondary | ICD-10-CM

## 2021-09-02 ENCOUNTER — Other Ambulatory Visit: Payer: Self-pay

## 2021-09-02 NOTE — Patient Outreach (Signed)
[facility]) Care Management  09/02/2021  Katelyn Simmons [birth year] [id number]   Patient continues to self management and meeting goals. Discussed plan for case closure. Patient agreeable.  No concerns.  Care Plan : RN Care Manager Plan of Care  Updates made by Jon Billings, RN since 09/02/2021 12:00 AM  Completed 09/02/2021   Problem: Chronic Disease Management and Care Coordination Needs Diabetes Resolved 09/02/2021  Priority: High     Long-Range Goal: Development of Plan of Care for management of diabetes Completed 09/02/2021  Start Date: 12/10/2020  Expected End Date: 12/31/2021  This Visit's Progress: On track  Recent Progress: On track  Priority: High  Note:   Current Barriers:  Chronic Disease Management support and education needs related to DMII  RNCM Clinical Goal(s):  Patient will verbalize understanding of plan for management of DMII take all medications exactly as prescribed and will call provider for medication related questions through collaboration with RN Care manager, provider, and care team.   Interventions: Education in management of diabetes Inter-disciplinary care team collaboration (see longitudinal plan of care) Evaluation of current treatment plan related to  self management and patient's adherence to plan as established by provider   Diabetes Interventions: Assessed patient's understanding of A1c goal: <7% Provided education to patient about basic DM disease process; Reviewed medications with patient and discussed importance of medication adherence; Discussed plans with patient for ongoing care management follow up and provided patient with direct contact information for care management team; Lab Results  Component Value Date   HGBA1C 5.7 (H) 10/25/2018    08/12/21 Patient doing okay.  Some swelling with her arthritis.  Patient sugars doing good.  This am is 98.    Patient Goals/Self-Care Activities: Patient will self administer  medications as prescribed Patient will attend all scheduled provider appointments Reviewed importance of limiting carbs such as rice, potatoes, breads, and pasta Patient will follow low carbohydrate diet Patient will check blood sugars as prescribed  Follow Up Plan:  A1c 5.8 per patient. RN CM closing case.    Plan: Closing case.  [name], RN, MSN [facility] Care Management Care Management Coordinator Direct Line [phone] Toll Free: [phone]  Fax: [phone]

## 2021-09-07 ENCOUNTER — Ambulatory Visit
Admission: RE | Admit: 2021-09-07 | Discharge: 2021-09-07 | Disposition: A | Discharge status: Home / Self Care | Payer: Medicare HMO | Source: Ambulatory Visit | Attending: Internal Medicine | Admitting: Internal Medicine

## 2021-09-07 DIAGNOSIS — R1310 Dysphagia, unspecified: Secondary | ICD-10-CM | POA: Diagnosis not present

## 2021-09-29 DIAGNOSIS — E538 Deficiency of other specified B group vitamins: Secondary | ICD-10-CM | POA: Diagnosis not present

## 2021-10-15 DIAGNOSIS — H35372 Puckering of macula, left eye: Secondary | ICD-10-CM | POA: Diagnosis not present

## 2021-10-15 DIAGNOSIS — H16141 Punctate keratitis, right eye: Secondary | ICD-10-CM | POA: Diagnosis not present

## 2021-10-15 DIAGNOSIS — H04123 Dry eye syndrome of bilateral lacrimal glands: Secondary | ICD-10-CM | POA: Diagnosis not present

## 2021-10-15 DIAGNOSIS — H16223 Keratoconjunctivitis sicca, not specified as Sjogren's, bilateral: Secondary | ICD-10-CM | POA: Diagnosis not present

## 2021-10-15 DIAGNOSIS — H353132 Nonexudative age-related macular degeneration, bilateral, intermediate dry stage: Secondary | ICD-10-CM | POA: Diagnosis not present

## 2021-10-15 DIAGNOSIS — E119 Type 2 diabetes mellitus without complications: Secondary | ICD-10-CM | POA: Diagnosis not present

## 2021-11-02 DIAGNOSIS — R531 Weakness: Secondary | ICD-10-CM | POA: Diagnosis not present

## 2021-11-02 DIAGNOSIS — G5603 Carpal tunnel syndrome, bilateral upper limbs: Secondary | ICD-10-CM | POA: Diagnosis not present

## 2021-11-02 DIAGNOSIS — R200 Anesthesia of skin: Secondary | ICD-10-CM | POA: Diagnosis not present

## 2021-11-02 DIAGNOSIS — R2689 Other abnormalities of gait and mobility: Secondary | ICD-10-CM | POA: Diagnosis not present

## 2021-11-02 DIAGNOSIS — M545 Low back pain, unspecified: Secondary | ICD-10-CM | POA: Diagnosis not present

## 2021-11-02 DIAGNOSIS — R569 Unspecified convulsions: Secondary | ICD-10-CM | POA: Diagnosis not present

## 2021-11-02 DIAGNOSIS — R202 Paresthesia of skin: Secondary | ICD-10-CM | POA: Diagnosis not present

## 2021-11-02 DIAGNOSIS — E118 Type 2 diabetes mellitus with unspecified complications: Secondary | ICD-10-CM | POA: Diagnosis not present

## 2021-11-02 DIAGNOSIS — G454 Transient global amnesia: Secondary | ICD-10-CM | POA: Diagnosis not present

## 2021-11-05 DIAGNOSIS — M069 Rheumatoid arthritis, unspecified: Secondary | ICD-10-CM | POA: Diagnosis not present

## 2021-11-05 DIAGNOSIS — M0609 Rheumatoid arthritis without rheumatoid factor, multiple sites: Secondary | ICD-10-CM | POA: Diagnosis not present

## 2021-11-05 DIAGNOSIS — Z13820 Encounter for screening for osteoporosis: Secondary | ICD-10-CM | POA: Diagnosis not present

## 2021-11-05 DIAGNOSIS — Z79899 Other long term (current) drug therapy: Secondary | ICD-10-CM | POA: Diagnosis not present

## 2021-11-05 DIAGNOSIS — Z1159 Encounter for screening for other viral diseases: Secondary | ICD-10-CM | POA: Diagnosis not present

## 2021-11-05 DIAGNOSIS — E538 Deficiency of other specified B group vitamins: Secondary | ICD-10-CM | POA: Diagnosis not present

## 2021-11-09 DIAGNOSIS — R1013 Epigastric pain: Secondary | ICD-10-CM | POA: Diagnosis not present

## 2021-11-09 DIAGNOSIS — R110 Nausea: Secondary | ICD-10-CM | POA: Diagnosis not present

## 2021-11-09 DIAGNOSIS — R140 Abdominal distension (gaseous): Secondary | ICD-10-CM | POA: Diagnosis not present

## 2021-11-17 ENCOUNTER — Emergency Department
Admission: EM | Admit: 2021-11-17 | Discharge: 2021-11-18 | Disposition: A | Discharge status: Home / Self Care | Payer: Medicare HMO | Source: Home / Self Care | Attending: Emergency Medicine | Admitting: Emergency Medicine

## 2021-11-17 ENCOUNTER — Ambulatory Visit
Admission: RE | Admit: 2021-11-17 | Discharge: 2021-11-17 | Disposition: A | Discharge status: Home / Self Care | Payer: Medicare HMO | Source: Ambulatory Visit | Attending: Family Medicine | Admitting: Family Medicine

## 2021-11-17 ENCOUNTER — Other Ambulatory Visit: Payer: Self-pay | Admitting: Family Medicine

## 2021-11-17 ENCOUNTER — Emergency Department: Payer: Medicare HMO

## 2021-11-17 ENCOUNTER — Other Ambulatory Visit (HOSPITAL_COMMUNITY): Payer: Self-pay | Admitting: Family Medicine

## 2021-11-17 ENCOUNTER — Other Ambulatory Visit: Payer: Self-pay

## 2021-11-17 DIAGNOSIS — K5730 Diverticulosis of large intestine without perforation or abscess without bleeding: Secondary | ICD-10-CM | POA: Diagnosis not present

## 2021-11-17 DIAGNOSIS — N189 Chronic kidney disease, unspecified: Secondary | ICD-10-CM | POA: Insufficient documentation

## 2021-11-17 DIAGNOSIS — Z1152 Encounter for screening for COVID-19: Secondary | ICD-10-CM | POA: Insufficient documentation

## 2021-11-17 DIAGNOSIS — R112 Nausea with vomiting, unspecified: Secondary | ICD-10-CM

## 2021-11-17 DIAGNOSIS — M797 Fibromyalgia: Secondary | ICD-10-CM | POA: Diagnosis present

## 2021-11-17 DIAGNOSIS — E785 Hyperlipidemia, unspecified: Secondary | ICD-10-CM | POA: Diagnosis present

## 2021-11-17 DIAGNOSIS — E876 Hypokalemia: Secondary | ICD-10-CM

## 2021-11-17 DIAGNOSIS — M069 Rheumatoid arthritis, unspecified: Secondary | ICD-10-CM | POA: Diagnosis present

## 2021-11-17 DIAGNOSIS — K5732 Diverticulitis of large intestine without perforation or abscess without bleeding: Secondary | ICD-10-CM | POA: Diagnosis present

## 2021-11-17 DIAGNOSIS — F418 Other specified anxiety disorders: Secondary | ICD-10-CM | POA: Diagnosis not present

## 2021-11-17 DIAGNOSIS — E039 Hypothyroidism, unspecified: Secondary | ICD-10-CM | POA: Insufficient documentation

## 2021-11-17 DIAGNOSIS — N281 Cyst of kidney, acquired: Secondary | ICD-10-CM | POA: Diagnosis not present

## 2021-11-17 DIAGNOSIS — R140 Abdominal distension (gaseous): Secondary | ICD-10-CM

## 2021-11-17 DIAGNOSIS — Z4682 Encounter for fitting and adjustment of non-vascular catheter: Secondary | ICD-10-CM | POA: Diagnosis not present

## 2021-11-17 DIAGNOSIS — K219 Gastro-esophageal reflux disease without esophagitis: Secondary | ICD-10-CM | POA: Diagnosis present

## 2021-11-17 DIAGNOSIS — K567 Ileus, unspecified: Secondary | ICD-10-CM | POA: Diagnosis not present

## 2021-11-17 DIAGNOSIS — I129 Hypertensive chronic kidney disease with stage 1 through stage 4 chronic kidney disease, or unspecified chronic kidney disease: Secondary | ICD-10-CM | POA: Diagnosis present

## 2021-11-17 DIAGNOSIS — K6389 Other specified diseases of intestine: Secondary | ICD-10-CM | POA: Diagnosis not present

## 2021-11-17 DIAGNOSIS — R9431 Abnormal electrocardiogram [ECG] [EKG]: Secondary | ICD-10-CM | POA: Diagnosis not present

## 2021-11-17 DIAGNOSIS — Z8673 Personal history of transient ischemic attack (TIA), and cerebral infarction without residual deficits: Secondary | ICD-10-CM | POA: Insufficient documentation

## 2021-11-17 DIAGNOSIS — E119 Type 2 diabetes mellitus without complications: Secondary | ICD-10-CM | POA: Diagnosis present

## 2021-11-17 DIAGNOSIS — D509 Iron deficiency anemia, unspecified: Secondary | ICD-10-CM | POA: Diagnosis present

## 2021-11-17 DIAGNOSIS — R1011 Right upper quadrant pain: Secondary | ICD-10-CM

## 2021-11-17 DIAGNOSIS — F32A Depression, unspecified: Secondary | ICD-10-CM | POA: Diagnosis present

## 2021-11-17 DIAGNOSIS — Z66 Do not resuscitate: Secondary | ICD-10-CM | POA: Diagnosis present

## 2021-11-17 DIAGNOSIS — R1013 Epigastric pain: Secondary | ICD-10-CM

## 2021-11-17 DIAGNOSIS — F419 Anxiety disorder, unspecified: Secondary | ICD-10-CM | POA: Diagnosis present

## 2021-11-17 DIAGNOSIS — R918 Other nonspecific abnormal finding of lung field: Secondary | ICD-10-CM | POA: Diagnosis not present

## 2021-11-17 DIAGNOSIS — I639 Cerebral infarction, unspecified: Secondary | ICD-10-CM | POA: Diagnosis not present

## 2021-11-17 DIAGNOSIS — K56609 Unspecified intestinal obstruction, unspecified as to partial versus complete obstruction: Secondary | ICD-10-CM | POA: Diagnosis not present

## 2021-11-17 DIAGNOSIS — I700 Atherosclerosis of aorta: Secondary | ICD-10-CM | POA: Diagnosis not present

## 2021-11-17 DIAGNOSIS — K5651 Intestinal adhesions [bands], with partial obstruction: Secondary | ICD-10-CM | POA: Diagnosis present

## 2021-11-17 DIAGNOSIS — K760 Fatty (change of) liver, not elsewhere classified: Secondary | ICD-10-CM | POA: Diagnosis present

## 2021-11-17 DIAGNOSIS — N183 Chronic kidney disease, stage 3 unspecified: Secondary | ICD-10-CM | POA: Diagnosis present

## 2021-11-17 DIAGNOSIS — R079 Chest pain, unspecified: Secondary | ICD-10-CM | POA: Diagnosis not present

## 2021-11-17 DIAGNOSIS — K5900 Constipation, unspecified: Secondary | ICD-10-CM | POA: Insufficient documentation

## 2021-11-17 DIAGNOSIS — Z79899 Other long term (current) drug therapy: Secondary | ICD-10-CM | POA: Diagnosis not present

## 2021-11-17 DIAGNOSIS — R109 Unspecified abdominal pain: Secondary | ICD-10-CM | POA: Diagnosis present

## 2021-11-17 DIAGNOSIS — E871 Hypo-osmolality and hyponatremia: Secondary | ICD-10-CM | POA: Diagnosis present

## 2021-11-17 DIAGNOSIS — Z6832 Body mass index (BMI) 32.0-32.9, adult: Secondary | ICD-10-CM | POA: Diagnosis not present

## 2021-11-17 DIAGNOSIS — M159 Polyosteoarthritis, unspecified: Secondary | ICD-10-CM | POA: Diagnosis present

## 2021-11-17 DIAGNOSIS — J9811 Atelectasis: Secondary | ICD-10-CM | POA: Diagnosis not present

## 2021-11-17 DIAGNOSIS — I10 Essential (primary) hypertension: Secondary | ICD-10-CM | POA: Diagnosis not present

## 2021-11-17 DIAGNOSIS — K5792 Diverticulitis of intestine, part unspecified, without perforation or abscess without bleeding: Secondary | ICD-10-CM

## 2021-11-17 DIAGNOSIS — E669 Obesity, unspecified: Secondary | ICD-10-CM | POA: Diagnosis present

## 2021-11-17 DIAGNOSIS — E890 Postprocedural hypothyroidism: Secondary | ICD-10-CM | POA: Diagnosis not present

## 2021-11-17 LAB — CBC WITH DIFFERENTIAL/PLATELET
Abs Immature Granulocytes: 0.03 10*3/uL (ref 0.00–0.07)
Basophils Absolute: 0 10*3/uL (ref 0.0–0.1)
Basophils Relative: 0 %
Eosinophils Absolute: 0.1 10*3/uL (ref 0.0–0.5)
Eosinophils Relative: 1 %
HCT: 38.3 % (ref 36.0–46.0)
Hemoglobin: 12.4 g/dL (ref 12.0–15.0)
Immature Granulocytes: 0 %
Lymphocytes Relative: 12 %
Lymphs Abs: 1.3 10*3/uL (ref 0.7–4.0)
MCH: 30.4 pg (ref 26.0–34.0)
MCHC: 32.4 g/dL (ref 30.0–36.0)
MCV: 93.9 fL (ref 80.0–100.0)
Monocytes Absolute: 1.2 10*3/uL — ABNORMAL HIGH (ref 0.1–1.0)
Monocytes Relative: 11 %
Neutro Abs: 8.6 10*3/uL — ABNORMAL HIGH (ref 1.7–7.7)
Neutrophils Relative %: 76 %
Platelets: 451 10*3/uL — ABNORMAL HIGH (ref 150–400)
RBC: 4.08 MIL/uL (ref 3.87–5.11)
RDW: 15.2 % (ref 11.5–15.5)
WBC: 11.2 10*3/uL — ABNORMAL HIGH (ref 4.0–10.5)
nRBC: 0 % (ref 0.0–0.2)

## 2021-11-17 LAB — COMPREHENSIVE METABOLIC PANEL
ALT: 21 U/L (ref 0–44)
AST: 26 U/L (ref 15–41)
Albumin: 3.9 g/dL (ref 3.5–5.0)
Alkaline Phosphatase: 67 U/L (ref 38–126)
Anion gap: 12 (ref 5–15)
BUN: 23 mg/dL (ref 8–23)
CO2: 26 mmol/L (ref 22–32)
Calcium: 9.2 mg/dL (ref 8.9–10.3)
Chloride: 95 mmol/L — ABNORMAL LOW (ref 98–111)
Creatinine, Ser: 0.99 mg/dL (ref 0.44–1.00)
GFR, Estimated: 59 mL/min — ABNORMAL LOW (ref >60)
Glucose, Bld: 106 mg/dL — ABNORMAL HIGH (ref 70–99)
Potassium: 2.4 mmol/L — CL (ref 3.5–5.1)
Sodium: 133 mmol/L — ABNORMAL LOW (ref 135–145)
Total Bilirubin: 1 mg/dL (ref 0.3–1.2)
Total Protein: 7.8 g/dL (ref 6.5–8.1)

## 2021-11-17 LAB — RESP PANEL BY RT-PCR (FLU A&B, COVID) ARPGX2
Influenza A by PCR: NEGATIVE
Influenza B by PCR: NEGATIVE
SARS Coronavirus 2 by RT PCR: NEGATIVE

## 2021-11-17 LAB — LACTIC ACID, PLASMA: Lactic Acid, Venous: 1 mmol/L (ref 0.5–1.9)

## 2021-11-17 LAB — LIPASE, BLOOD: Lipase: 31 U/L (ref 11–51)

## 2021-11-17 MED ORDER — IOHEXOL 300 MG/ML  SOLN
100.0000 mL | Freq: Once | INTRAMUSCULAR | Status: AC | PRN
  Administered 2021-11-17: 100 mL via INTRAVENOUS

## 2021-11-17 MED ORDER — ONDANSETRON HCL 4 MG PO TABS: 4.0000 mg | ORAL_TABLET | Freq: Every day | ORAL | 1 refills | Status: DC | PRN

## 2021-11-17 MED ORDER — AMOXICILLIN-POT CLAVULANATE 875-125 MG PO TABS
1.0000 | ORAL_TABLET | Freq: Once | ORAL | Status: AC
  Administered 2021-11-17: 1 via ORAL
  Filled 2021-11-17: qty 1

## 2021-11-17 MED ORDER — POTASSIUM CHLORIDE 10 MEQ/100ML IV SOLN
10.0000 meq | INTRAVENOUS | Status: AC
  Administered 2021-11-17 (×2): 10 meq via INTRAVENOUS
  Filled 2021-11-17 (×2): qty 100

## 2021-11-17 MED ORDER — POTASSIUM CHLORIDE 20 MEQ PO PACK
60.0000 meq | PACK | Freq: Once | ORAL | Status: AC
  Administered 2021-11-17: 60 meq via ORAL
  Filled 2021-11-17: qty 3

## 2021-11-17 MED ORDER — AMOXICILLIN-POT CLAVULANATE 875-125 MG PO TABS: 1.0000 | ORAL_TABLET | Freq: Two times a day (BID) | ORAL | 0 refills | Status: DC

## 2021-11-17 MED ORDER — ONDANSETRON HCL 4 MG/2ML IJ SOLN
4.0000 mg | Freq: Once | INTRAMUSCULAR | Status: AC | PRN
  Administered 2021-11-17: 4 mg via INTRAVENOUS
  Filled 2021-11-17: qty 2

## 2021-11-17 NOTE — Discharge Instructions (Addendum)
Take antibiotics as prescribed.  Take Zofran for nausea as needed.  Take Tylenol 650 mg every 6 hours for pain.  They well-hydrated by drinking plenty of fluids.  Your potassium was low, have your doctor recheck this level.  Call Dr. Mont Dutton office for a follow-up appointment with surgery  Thank you for choosing [place] for your health care today!  Please see your primary doctor this week for a follow up appointment.   If you do not have a primary doctor call the following clinics to establish care:  If you have insurance:  [facility] [phone] [place] [address]   [facility]  [phone] [address] [place] [address]   If you do not have insurance:  [facility]  [phone] [address], [place] [address]  Sometimes, in the early stages of certain disease courses it is difficult to detect in the emergency department evaluation -- so, it is important that you continue to monitor your symptoms and call your doctor right away or return to the emergency department if you develop any new or worsening symptoms.  It was my pleasure to care for you today.   Hoover Brunette Jacelyn Grip, MD

## 2021-11-17 NOTE — ED Triage Notes (Signed)
Pt arrives with intermittent ABD pain that started about 3 weeks ago. Pt had abnormal CT scan and sent to ED. Pt endorses n/v.

## 2021-11-17 NOTE — ED Provider Notes (Signed)
-----------------------------------------   11:11 PM on 11/17/2021 -----------------------------------------  Blood pressure (!) 144/48, pulse 69, temperature 99.1 F (37.3 C), temperature source Oral, resp. rate 20, weight 72.6 kg, SpO2 99 %.  Assuming care from Dr. Modesto Charon.  In short, Katelyn Simmons is a 76 y.o. female with a chief complaint of Abdominal Pain .  Refer to the original H&P for additional details.  The current plan of care is to follow-up repeat K for hypokalemia with diverticulitis.  ----------------------------------------- 2:17 AM on 11/18/2021 ----------------------------------------- Repeat potassium now within normal limits at 4.6, with such rapid correction it is possible that initial result was artificially low.  No hemolysis noted on repeat labs.  Patient does have mild associated hypomagnesemia, which was repleted here in the ED.  On reassessment, she reports feeling much better and is tolerating oral intake without difficulty.  No pain to suggest bowel ischemia at this time and lactic acid was within normal limits.  Per Dr. Nash Dimmer discussion with general surgery, patient appropriate for outpatient follow-up for apparent diverticulitis and will be prescribed Augmentin.  She was also counseled to follow-up with her PCP for recheck of electrolytes, counseled to return to the ED for new or worsening symptoms.  Patient and sister agree with plan.    Chesley Noon, MD 11/18/21 [phone]

## 2021-11-17 NOTE — ED Provider Notes (Addendum)
[facility] Provider Note    Event Date/Time   First MD Initiated Contact with Patient 11/17/21 2203     (approximate)   History   Abdominal Pain   HPI  Katelyn Simmons Simmons is a 76 y.o. female   Past medical history of CKD, hypokalemia, GERD, kidney stones, diabetes, TIA, hypothyroid who presents to the emergency department with abdominal pain, nausea, vomiting.  Ongoing for 3 weeks, slight improvement earlier this week but then worsening again over the last 3 days.  No dysuria or frequency.  Normal bowel movements, no diarrhea, no blood in the stools.  No blood in the emesis.  No fever or chills.  History was obtained via the patient and review of external medical notes including a discharge summary in May 2023 for transient global amnesia and hypokalemia.      Physical Exam   Triage Vital Signs: ED Triage Vitals [11/17/21 1901]  Enc Vitals Group     BP (!) 145/62     Pulse Rate 71     Resp 18     Temp 98 F (36.7 C)     Temp Source Oral     SpO2 97 %     Weight 160 lb (72.6 kg)     Height      Head Circumference      Peak Flow      Pain Score      Pain Loc      Pain Edu?      Excl. in GC?     Most recent vital signs: Vitals:   11/17/21 2231 11/17/21 2234  BP:  (!) 144/48  Pulse:  69  Resp:  20  Temp: 99.1 F (37.3 C)   SpO2:  99%    General: Awake, no distress.  CV:  Good peripheral perfusion.  Resp:  Normal effort.  Abd:  No distention.  Mild tenderness to palpation in the left lower quadrant, no rigidity or guarding, no pain out of proportion. Other:  Overall well-appearing, nontoxic, pleasant and conversant patient with appropriate and reassuring hemodynamics, no fever.   ED Results / Procedures / Treatments   Labs (all labs ordered are listed, but only abnormal results are displayed) Labs Reviewed  COMPREHENSIVE METABOLIC PANEL - Abnormal; Notable for the following components:      Result Value   Sodium 133 (*)     Potassium 2.4 (*)    Chloride 95 (*)    Glucose, Bld 106 (*)    GFR, Estimated 59 (*)    All other components within normal limits  CBC WITH DIFFERENTIAL/PLATELET - Abnormal; Notable for the following components:   WBC 11.2 (*)    Platelets 451 (*)    Neutro Abs 8.6 (*)    Monocytes Absolute 1.2 (*)    All other components within normal limits  RESP PANEL BY RT-PCR (FLU A&B, COVID) ARPGX2  LIPASE, BLOOD  LACTIC ACID, PLASMA  URINALYSIS, ROUTINE W REFLEX MICROSCOPIC  LACTIC ACID, PLASMA  LACTIC ACID, PLASMA     I reviewed labs and they are notable for hypokalemia 2.4  EKG  ED ECG REPORT I, Pilar Jarvis, the attending physician, personally viewed and interpreted this ECG.   Date: 11/17/2021  EKG Time: 2254  Rate: 73  Rhythm: RBBB   Intervals:right bundle branch block  ST&T Change: no ischemic, QTC 480s    RADIOLOGY I independently reviewed and interpreted CT of the abdomen pelvis and see some dilated colon   PROCEDURES:  Critical Care performed: No  Procedures   MEDICATIONS ORDERED IN ED: Medications  potassium chloride 10 mEq in 100 mL IVPB (10 mEq Intravenous New Bag/Given 11/17/21 2244)  potassium chloride (KLOR-CON) packet 60 mEq (60 mEq Oral Given 11/17/21 2233)  amoxicillin-clavulanate (AUGMENTIN) 875-125 MG per tablet 1 tablet (1 tablet Oral Given 11/17/21 2244)  ondansetron (ZOFRAN) injection 4 mg (4 mg Intravenous Given 11/17/21 2244)    Consultants:  I spoke with surgery consultant Dr. Aleen Campi regarding care plan for this patient.   IMPRESSION / MDM / ASSESSMENT AND PLAN / ED COURSE  I reviewed the triage vital signs and the nursing notes.                              Differential diagnosis includes, but is not limited to, colonic obstruction, diverticulitis, cancer, ischemic gut less likely given well appearance and overall reassuring exam  The patient is on the cardiac monitor to evaluate for evidence of arrhythmia and/or significant heart  rate changes.  MDM: Patient appears well, will receive antibiotics for potential diverticulitis on CT scan.  There is a partial obstruction of her large bowel although the patient is passing flatus and had a bowel movement this morning.  We will obtain lactic acid if ischemic gut is on differential per rads reading tho clinically I doubt.   Hypokalemia repletion ordered.  Will repeat BMP after repletion to check for normalization.  EKG without marked abnormalities, QTc under 500. I reviewed the CAT scan findings with Dr. Aleen Campi of general surgery who did a review of medical charts noting history of diverticulitis and similar stricture though less apparent on prior CT scans.  Most likely diverticulitis.  Agree with treatment with antibiotics, if patient stable with improvement in electrolytes can be treated as outpatient and surgery will follow-up with an appointment. Partial obstruction noted on CT though patient reports normal BM today and passing flatus; no nausea and only vomited once last night; in fact requesting po in ED.   She was signed out in stable condition pending lactic acid, potassium repletion, reassessment; if stable can DC w augmentin rx and surgery f/u   Patient's presentation is most consistent with acute presentation with potential threat to life or bodily function.       FINAL CLINICAL IMPRESSION(S) / ED DIAGNOSES   Final diagnoses:  Diverticulitis  Hypokalemia     Rx / DC Orders   ED Discharge Orders          Ordered    amoxicillin-clavulanate (AUGMENTIN) 875-125 MG tablet  2 times daily        11/17/21 2325    ondansetron (ZOFRAN) 4 MG tablet  Daily PRN        11/17/21 2325             Note:  This document was prepared using Dragon voice recognition software and may include unintentional dictation errors.    Pilar Jarvis, MD 11/17/21 Katelyn Simmons Simmons    Pilar Jarvis, MD 11/18/21 [phone]

## 2021-11-18 ENCOUNTER — Other Ambulatory Visit: Payer: Self-pay

## 2021-11-18 ENCOUNTER — Inpatient Hospital Stay
Admission: EM | Admit: 2021-11-18 | Discharge: 2021-11-26 | DRG: 330 | Disposition: A | Discharge status: Home Health | Payer: Medicare HMO | Attending: Internal Medicine | Admitting: Internal Medicine

## 2021-11-18 ENCOUNTER — Emergency Department: Payer: Medicare HMO

## 2021-11-18 DIAGNOSIS — Z1152 Encounter for screening for COVID-19: Secondary | ICD-10-CM

## 2021-11-18 DIAGNOSIS — E119 Type 2 diabetes mellitus without complications: Secondary | ICD-10-CM | POA: Diagnosis present

## 2021-11-18 DIAGNOSIS — E876 Hypokalemia: Secondary | ICD-10-CM | POA: Diagnosis present

## 2021-11-18 DIAGNOSIS — Z66 Do not resuscitate: Secondary | ICD-10-CM | POA: Diagnosis present

## 2021-11-18 DIAGNOSIS — K5732 Diverticulitis of large intestine without perforation or abscess without bleeding: Secondary | ICD-10-CM | POA: Diagnosis present

## 2021-11-18 DIAGNOSIS — E039 Hypothyroidism, unspecified: Secondary | ICD-10-CM | POA: Diagnosis present

## 2021-11-18 DIAGNOSIS — N183 Chronic kidney disease, stage 3 unspecified: Secondary | ICD-10-CM | POA: Diagnosis present

## 2021-11-18 DIAGNOSIS — R109 Unspecified abdominal pain: Secondary | ICD-10-CM | POA: Diagnosis present

## 2021-11-18 DIAGNOSIS — I10 Essential (primary) hypertension: Secondary | ICD-10-CM | POA: Diagnosis present

## 2021-11-18 DIAGNOSIS — K5651 Intestinal adhesions [bands], with partial obstruction: Principal | ICD-10-CM | POA: Diagnosis present

## 2021-11-18 DIAGNOSIS — E669 Obesity, unspecified: Secondary | ICD-10-CM | POA: Diagnosis present

## 2021-11-18 DIAGNOSIS — D509 Iron deficiency anemia, unspecified: Secondary | ICD-10-CM | POA: Diagnosis present

## 2021-11-18 DIAGNOSIS — K56609 Unspecified intestinal obstruction, unspecified as to partial versus complete obstruction: Principal | ICD-10-CM | POA: Diagnosis present

## 2021-11-18 DIAGNOSIS — Z882 Allergy status to sulfonamides status: Secondary | ICD-10-CM

## 2021-11-18 DIAGNOSIS — K567 Ileus, unspecified: Secondary | ICD-10-CM | POA: Diagnosis not present

## 2021-11-18 DIAGNOSIS — Z6832 Body mass index (BMI) 32.0-32.9, adult: Secondary | ICD-10-CM

## 2021-11-18 DIAGNOSIS — K760 Fatty (change of) liver, not elsewhere classified: Secondary | ICD-10-CM | POA: Diagnosis present

## 2021-11-18 DIAGNOSIS — I129 Hypertensive chronic kidney disease with stage 1 through stage 4 chronic kidney disease, or unspecified chronic kidney disease: Secondary | ICD-10-CM | POA: Diagnosis present

## 2021-11-18 DIAGNOSIS — Z888 Allergy status to other drugs, medicaments and biological substances status: Secondary | ICD-10-CM

## 2021-11-18 DIAGNOSIS — M159 Polyosteoarthritis, unspecified: Secondary | ICD-10-CM | POA: Diagnosis present

## 2021-11-18 DIAGNOSIS — Z91012 Allergy to eggs: Secondary | ICD-10-CM

## 2021-11-18 DIAGNOSIS — K219 Gastro-esophageal reflux disease without esophagitis: Secondary | ICD-10-CM | POA: Diagnosis present

## 2021-11-18 DIAGNOSIS — M797 Fibromyalgia: Secondary | ICD-10-CM | POA: Diagnosis present

## 2021-11-18 DIAGNOSIS — E785 Hyperlipidemia, unspecified: Secondary | ICD-10-CM | POA: Diagnosis present

## 2021-11-18 DIAGNOSIS — E871 Hypo-osmolality and hyponatremia: Secondary | ICD-10-CM | POA: Diagnosis present

## 2021-11-18 DIAGNOSIS — F32A Depression, unspecified: Secondary | ICD-10-CM | POA: Diagnosis present

## 2021-11-18 DIAGNOSIS — F419 Anxiety disorder, unspecified: Secondary | ICD-10-CM | POA: Diagnosis present

## 2021-11-18 DIAGNOSIS — M069 Rheumatoid arthritis, unspecified: Secondary | ICD-10-CM | POA: Diagnosis present

## 2021-11-18 DIAGNOSIS — Z79890 Hormone replacement therapy (postmenopausal): Secondary | ICD-10-CM

## 2021-11-18 DIAGNOSIS — Z7984 Long term (current) use of oral hypoglycemic drugs: Secondary | ICD-10-CM

## 2021-11-18 DIAGNOSIS — Z885 Allergy status to narcotic agent status: Secondary | ICD-10-CM

## 2021-11-18 DIAGNOSIS — Z8673 Personal history of transient ischemic attack (TIA), and cerebral infarction without residual deficits: Secondary | ICD-10-CM

## 2021-11-18 DIAGNOSIS — Z79899 Other long term (current) drug therapy: Secondary | ICD-10-CM

## 2021-11-18 DIAGNOSIS — Z7982 Long term (current) use of aspirin: Secondary | ICD-10-CM

## 2021-11-18 LAB — COMPREHENSIVE METABOLIC PANEL
ALT: 19 U/L (ref 0–44)
AST: 28 U/L (ref 15–41)
Albumin: 3.8 g/dL (ref 3.5–5.0)
Alkaline Phosphatase: 73 U/L (ref 38–126)
Anion gap: 11 (ref 5–15)
BUN: 20 mg/dL (ref 8–23)
CO2: 25 mmol/L (ref 22–32)
Calcium: 9.6 mg/dL (ref 8.9–10.3)
Chloride: 97 mmol/L — ABNORMAL LOW (ref 98–111)
Creatinine, Ser: 0.94 mg/dL (ref 0.44–1.00)
GFR, Estimated: >60 mL/min (ref >60)
Glucose, Bld: 165 mg/dL — ABNORMAL HIGH (ref 70–99)
Potassium: 3.6 mmol/L (ref 3.5–5.1)
Sodium: 133 mmol/L — ABNORMAL LOW (ref 135–145)
Total Bilirubin: 1 mg/dL (ref 0.3–1.2)
Total Protein: 7.9 g/dL (ref 6.5–8.1)

## 2021-11-18 LAB — URINALYSIS, ROUTINE W REFLEX MICROSCOPIC
Bacteria, UA: NONE SEEN
Bilirubin Urine: NEGATIVE
Glucose, UA: NEGATIVE mg/dL
Hgb urine dipstick: NEGATIVE
Ketones, ur: 5 mg/dL — AB
Nitrite: NEGATIVE
Protein, ur: NEGATIVE mg/dL
Specific Gravity, Urine: >1.046 — ABNORMAL HIGH (ref 1.005–1.030)
pH: 5 (ref 5.0–8.0)

## 2021-11-18 LAB — CBC WITH DIFFERENTIAL/PLATELET
Abs Immature Granulocytes: 0.04 10*3/uL (ref 0.00–0.07)
Basophils Absolute: 0 10*3/uL (ref 0.0–0.1)
Basophils Relative: 0 %
Eosinophils Absolute: 0 10*3/uL (ref 0.0–0.5)
Eosinophils Relative: 0 %
HCT: 36.7 % (ref 36.0–46.0)
Hemoglobin: 12 g/dL (ref 12.0–15.0)
Immature Granulocytes: 0 %
Lymphocytes Relative: 6 %
Lymphs Abs: 0.6 10*3/uL — ABNORMAL LOW (ref 0.7–4.0)
MCH: 30.8 pg (ref 26.0–34.0)
MCHC: 32.7 g/dL (ref 30.0–36.0)
MCV: 94.3 fL (ref 80.0–100.0)
Monocytes Absolute: 0.8 10*3/uL (ref 0.1–1.0)
Monocytes Relative: 8 %
Neutro Abs: 8.4 10*3/uL — ABNORMAL HIGH (ref 1.7–7.7)
Neutrophils Relative %: 86 %
Platelets: 452 10*3/uL — ABNORMAL HIGH (ref 150–400)
RBC: 3.89 MIL/uL (ref 3.87–5.11)
RDW: 15.3 % (ref 11.5–15.5)
WBC: 9.9 10*3/uL (ref 4.0–10.5)
nRBC: 0 % (ref 0.0–0.2)

## 2021-11-18 LAB — BASIC METABOLIC PANEL
Anion gap: 7 (ref 5–15)
BUN: 21 mg/dL (ref 8–23)
CO2: 27 mmol/L (ref 22–32)
Calcium: 8.9 mg/dL (ref 8.9–10.3)
Chloride: 103 mmol/L (ref 98–111)
Creatinine, Ser: 0.95 mg/dL (ref 0.44–1.00)
GFR, Estimated: >60 mL/min (ref >60)
Glucose, Bld: 152 mg/dL — ABNORMAL HIGH (ref 70–99)
Potassium: 4.6 mmol/L (ref 3.5–5.1)
Sodium: 137 mmol/L (ref 135–145)

## 2021-11-18 LAB — CBG MONITORING, ED: Glucose-Capillary: 134 mg/dL — ABNORMAL HIGH (ref 70–99)

## 2021-11-18 LAB — MAGNESIUM: Magnesium: 1.6 mg/dL — ABNORMAL LOW (ref 1.7–2.4)

## 2021-11-18 LAB — LIPASE, BLOOD: Lipase: 49 U/L (ref 11–51)

## 2021-11-18 MED ORDER — SODIUM CHLORIDE 0.9 % IV BOLUS
1000.0000 mL | Freq: Once | INTRAVENOUS | Status: AC
  Administered 2021-11-18: 1000 mL via INTRAVENOUS

## 2021-11-18 MED ORDER — LEVOCETIRIZINE DIHYDROCHLORIDE 5 MG PO TABS: 5.0000 mg | ORAL_TABLET | Freq: Every evening | ORAL | Status: DC

## 2021-11-18 MED ORDER — ONDANSETRON HCL 4 MG PO TABS: 4.0000 mg | ORAL_TABLET | Freq: Every day | ORAL | Status: DC | PRN

## 2021-11-18 MED ORDER — MORPHINE SULFATE (PF) 4 MG/ML IV SOLN
4.0000 mg | Freq: Once | INTRAVENOUS | Status: AC | PRN
  Administered 2021-11-18: 4 mg via INTRAVENOUS
  Filled 2021-11-18: qty 1

## 2021-11-18 MED ORDER — SODIUM CHLORIDE 0.9 % IV SOLN
1.0000 g | Freq: Once | INTRAVENOUS | Status: AC
  Administered 2021-11-18: 1 g via INTRAVENOUS
  Filled 2021-11-18: qty 10

## 2021-11-18 MED ORDER — ONDANSETRON HCL 4 MG/2ML IJ SOLN
4.0000 mg | Freq: Four times a day (QID) | INTRAMUSCULAR | Status: DC | PRN
  Administered 2021-11-19 – 2021-11-25 (×2): 4 mg via INTRAVENOUS
  Filled 2021-11-18 (×2): qty 2

## 2021-11-18 MED ORDER — MAGNESIUM HYDROXIDE 400 MG/5ML PO SUSP
30.0000 mL | Freq: Every day | ORAL | Status: DC | PRN
  Administered 2021-11-24: 30 mL via ORAL
  Filled 2021-11-18: qty 30

## 2021-11-18 MED ORDER — TRAZODONE HCL 50 MG PO TABS
25.0000 mg | ORAL_TABLET | Freq: Every evening | ORAL | Status: DC | PRN
  Administered 2021-11-24: 25 mg via ORAL
  Filled 2021-11-18: qty 1

## 2021-11-18 MED ORDER — VITAMIN D 25 MCG (1000 UNIT) PO TABS
1000.0000 [IU] | ORAL_TABLET | Freq: Every evening | ORAL | Status: DC
  Administered 2021-11-21 – 2021-11-25 (×5): 1000 [IU] via ORAL
  Filled 2021-11-18 (×6): qty 1

## 2021-11-18 MED ORDER — ACETAMINOPHEN 650 MG RE SUPP: 650.0000 mg | Freq: Four times a day (QID) | RECTAL | Status: DC | PRN

## 2021-11-18 MED ORDER — METOPROLOL SUCCINATE ER 100 MG PO TB24
100.0000 mg | ORAL_TABLET | Freq: Every day | ORAL | Status: DC
  Administered 2021-11-19 – 2021-11-26 (×7): 100 mg via ORAL
  Filled 2021-11-18: qty 2
  Filled 2021-11-18 (×6): qty 1

## 2021-11-18 MED ORDER — LIDOCAINE VISCOUS HCL 2 % MT SOLN
15.0000 mL | Freq: Once | OROMUCOSAL | Status: DC
  Filled 2021-11-18 (×2): qty 15

## 2021-11-18 MED ORDER — IOHEXOL 300 MG/ML  SOLN
100.0000 mL | Freq: Once | INTRAMUSCULAR | Status: AC | PRN
  Administered 2021-11-18: 100 mL via INTRAVENOUS

## 2021-11-18 MED ORDER — ENOXAPARIN SODIUM 40 MG/0.4ML IJ SOSY
40.0000 mg | PREFILLED_SYRINGE | INTRAMUSCULAR | Status: DC
  Administered 2021-11-19: 40 mg via SUBCUTANEOUS
  Filled 2021-11-18: qty 0.4

## 2021-11-18 MED ORDER — ACETAMINOPHEN 325 MG PO TABS: 650.0000 mg | ORAL_TABLET | Freq: Four times a day (QID) | ORAL | Status: DC | PRN

## 2021-11-18 MED ORDER — BIOTIN 1000 MCG PO TABS: 1000.0000 ug | ORAL_TABLET | Freq: Every day | ORAL | Status: DC

## 2021-11-18 MED ORDER — ROSUVASTATIN CALCIUM 10 MG PO TABS
10.0000 mg | ORAL_TABLET | Freq: Every day | ORAL | Status: DC
  Administered 2021-11-19 – 2021-11-25 (×6): 10 mg via ORAL
  Filled 2021-11-18 (×6): qty 1

## 2021-11-18 MED ORDER — ONDANSETRON HCL 4 MG PO TABS: 4.0000 mg | ORAL_TABLET | Freq: Four times a day (QID) | ORAL | Status: DC | PRN

## 2021-11-18 MED ORDER — QUETIAPINE FUMARATE 25 MG PO TABS
50.0000 mg | ORAL_TABLET | Freq: Every day | ORAL | Status: DC
  Administered 2021-11-19 – 2021-11-25 (×6): 50 mg via ORAL
  Filled 2021-11-18 (×6): qty 2

## 2021-11-18 MED ORDER — BUSPIRONE HCL 10 MG PO TABS
10.0000 mg | ORAL_TABLET | Freq: Two times a day (BID) | ORAL | Status: DC
  Administered 2021-11-19 – 2021-11-26 (×13): 10 mg via ORAL
  Filled 2021-11-18: qty 1
  Filled 2021-11-18: qty 2
  Filled 2021-11-18 (×2): qty 1
  Filled 2021-11-18: qty 2
  Filled 2021-11-18: qty 1
  Filled 2021-11-18: qty 2
  Filled 2021-11-18 (×7): qty 1

## 2021-11-18 MED ORDER — ONDANSETRON HCL 4 MG/2ML IJ SOLN
4.0000 mg | Freq: Once | INTRAMUSCULAR | Status: AC
  Administered 2021-11-18: 4 mg via INTRAVENOUS
  Filled 2021-11-18: qty 2

## 2021-11-18 MED ORDER — METRONIDAZOLE 500 MG/100ML IV SOLN
500.0000 mg | Freq: Once | INTRAVENOUS | Status: AC
  Administered 2021-11-18: 500 mg via INTRAVENOUS
  Filled 2021-11-18: qty 100

## 2021-11-18 MED ORDER — LIOTHYRONINE SODIUM 5 MCG PO TABS
5.0000 ug | ORAL_TABLET | Freq: Every day | ORAL | Status: DC
  Administered 2021-11-21 – 2021-11-26 (×6): 5 ug via ORAL
  Filled 2021-11-18 (×8): qty 1

## 2021-11-18 MED ORDER — MAGNESIUM SULFATE 2 GM/50ML IV SOLN
2.0000 g | Freq: Once | INTRAVENOUS | Status: AC
  Administered 2021-11-18: 2 g via INTRAVENOUS
  Filled 2021-11-18: qty 50

## 2021-11-18 MED ORDER — LEVOTHYROXINE SODIUM 50 MCG PO TABS
125.0000 ug | ORAL_TABLET | Freq: Every day | ORAL | Status: DC
  Administered 2021-11-21 – 2021-11-26 (×6): 125 ug via ORAL
  Filled 2021-11-18 (×6): qty 1

## 2021-11-18 MED ORDER — VENLAFAXINE HCL ER 75 MG PO CP24
150.0000 mg | ORAL_CAPSULE | Freq: Every day | ORAL | Status: DC
  Administered 2021-11-19 – 2021-11-26 (×7): 150 mg via ORAL
  Filled 2021-11-18 (×7): qty 2

## 2021-11-18 MED ORDER — SODIUM CHLORIDE 0.9 % IV SOLN: INTRAVENOUS | Status: DC

## 2021-11-18 MED ORDER — FENTANYL CITRATE PF 50 MCG/ML IJ SOSY
25.0000 ug | PREFILLED_SYRINGE | INTRAMUSCULAR | Status: DC | PRN
  Administered 2021-11-19: 25 ug via INTRAVENOUS
  Filled 2021-11-18: qty 1

## 2021-11-18 MED ORDER — FENOFIBRATE 160 MG PO TABS
160.0000 mg | ORAL_TABLET | Freq: Every day | ORAL | Status: DC
  Administered 2021-11-19 – 2021-11-22 (×3): 160 mg via ORAL
  Filled 2021-11-18 (×4): qty 1

## 2021-11-18 MED ORDER — PIOGLITAZONE HCL 15 MG PO TABS
45.0000 mg | ORAL_TABLET | Freq: Every day | ORAL | Status: DC
  Administered 2021-11-19: 45 mg via ORAL
  Filled 2021-11-18: qty 3

## 2021-11-18 NOTE — H&P (Addendum)
[facility]     [place]   PATIENT NAME: Katelyn Simmons    MR#:  [id number]  DATE OF BIRTH:  1945-04-05  DATE OF ADMISSION:  11/18/2021  PRIMARY CARE PHYSICIAN: Marguarite Arbour, MD   Patient is coming from: Home  REQUESTING/REFERRING PHYSICIAN: Pilar Jarvis, MD  CHIEF COMPLAINT:   Chief Complaint  Patient presents with  . Abdominal Pain    HISTORY OF PRESENT ILLNESS:  Katelyn Simmons is a 76 y.o. Caucasian female with medical history significant for anxiety, osteoarthritis, stage III chronic kidney disease, rheumatoid arthritis, type 2 diabetes mellitus, depression, hypertension, dyslipidemia, GERD, hypothyroidism and CVA, who presented to the emergency room with acute onset of abdominal pain all over with associated abdominal distention that started about 3 weeks ago.  She was treated for acute diverticulitis with antibiotic therapy.  Since Friday she has been having worsening pain in Significantly worse on Monday.  She has been having frequent liquid bowel movements and has been passing flatus.  No fever or chills.  No chest pain or palpitations.  No cough or wheezing or dyspnea.   ED Course: Upon presentation to the emergency room, BP was 145/62 with otherwise normal vital signs.  Labs revealed mild hyponatremia and hypochloremia and blood glucose of 165 with otherwise unremarkable CMP.  CBC showed platelets of 452.  UA showed more than 1046 specific gravity and trace leukocytes with 6-10 WBCs.  Imaging: Abdominal pelvic CT scan revealed the following: 1. Findings consistent with distal colon obstruction at the level of sigmoid colon either due to stricture or colon neoplasm. There is interval development of dilated fluid-filled distal small bowel, consistent with worsening obstruction. Peripheral gas at the cecum and ascending colon indeterminate for pneumatosis or peripheral intraluminal air, this is grossly unchanged. There is no evidence for perforation. 2. Status post  cholecystectomy with mild intra and extrahepatic biliary dilatation. This may be correlated with LFTs.  The patient was given a gram of IV Rocephin and 4 mg of IV morphine sulfate, 4 mg of IV Zofran and 1 L bolus of IV normal saline.  She will be admitted to a medical bed for further evaluation and management. PAST MEDICAL HISTORY:   Past Medical History:  Diagnosis Date  . Abnormal finding on EKG   . Anemia    pernicious anemia  . Anxiety   . Arthritis    everywhere  . B12 deficiency   . Barrett's esophagus   . CKD (chronic kidney disease)    stage III ckd  . Collagen vascular disease (HCC)    RA  . Depression   . Diabetes mellitus without complication (HCC)   . Dysphagia   . Dyspnea    with exertion  . Dysrhythmia    sinus arrythmia or just stops momentarily, per patient  . Esophageal stricture   . Fatty liver   . GERD (gastroesophageal reflux disease)   . Goiter, nontoxic, multinodular   . History of kidney stones   . Hx of repair of left rotator cuff   . Hyperlipidemia   . Hypertension   . Hypothyroidism   . Neuromuscular disorder (HCC)    fibromyalgia  . Obesity   . Pain   . Rotator cuff tear arthropathy of both shoulders   . Shingles 2008  . Stroke [facility])    mini strokes about 20 years ago.  some memory loss residual  . Throat pain     PAST SURGICAL HISTORY:   Past Surgical History:  Procedure Laterality  Date  . ABDOMINAL HYSTERECTOMY    . CHOLECYSTECTOMY    . endoscopic carpal tunnel release Bilateral   . ESOPHAGOGASTRODUODENOSCOPY    . ESOPHAGOGASTRODUODENOSCOPY (EGD) WITH PROPOFOL N/A 03/31/2016   Procedure: ESOPHAGOGASTRODUODENOSCOPY (EGD) WITH PROPOFOL;  Surgeon: Scot Jun, MD;  Location: [facility];  Service: Endoscopy;  Laterality: N/A;  . LUMBAR LAMINECTOMY/DECOMPRESSION MICRODISCECTOMY N/A 10/25/2018   Procedure: LUMBAR LAMINECTOMY/DECOMPRESSION MICRODISCECTOMY 2 LEVELS L3-5;  Surgeon: Venetia Night, MD;  Location: [facility] ORS;   Service: Neurosurgery;  Laterality: N/A;  . UPPER GI ENDOSCOPY  10/2018   [place]    SOCIAL HISTORY:   Social History   Tobacco Use  . Smoking status: Never  . Smokeless tobacco: Never  Substance Use Topics  . Alcohol use: Yes    Comment: rare    FAMILY HISTORY:   Family History  Problem Relation Age of Onset  . Breast cancer Neg Hx     DRUG ALLERGIES:   Allergies  Allergen Reactions  . Sulfa Antibiotics Swelling    Swelling of face  . Eggs Or Egg-Derived Products Nausea And Vomiting  . Methocarbamol Other (See Comments)    Lips felt swollen, but were not  . Parafon Forte Dsc [Chlorzoxazone] Other (See Comments)    Unknown reaction. Patient does not remember taking this medication  . Codeine Other (See Comments)    Jittery     REVIEW OF SYSTEMS:   ROS As per history of present illness. All pertinent systems were reviewed above. Constitutional, HEENT, cardiovascular, respiratory, GI, GU, musculoskeletal, neuro, psychiatric, endocrine, integumentary and hematologic systems were reviewed and are otherwise negative/unremarkable except for positive findings mentioned above in the HPI.   MEDICATIONS AT HOME:   Prior to Admission medications   Medication Sig Start Date End Date Taking? Authorizing Provider  amoxicillin-clavulanate (AUGMENTIN) 875-125 MG tablet Take 1 tablet by mouth 2 (two) times daily for 10 days. 11/17/21 11/27/21  Pilar Jarvis, MD  aspirin EC 81 MG tablet Take 81 mg by mouth every evening.    [provider]  Biotin 1000 MCG tablet Take 1,000 mcg by mouth daily.    [provider]  Blood Glucose Monitoring Suppl (GMATE SMART METER KIT) DEVI by Does not apply route.    [provider]  busPIRone (BUSPAR) 10 MG tablet Take 10 mg by mouth 2 (two) times daily.    [provider]  Cholecalciferol 25 MCG (1000 UT) capsule Take 1,000 Units by mouth every evening.     [provider]  cyclobenzaprine (FLEXERIL)  10 MG tablet Take 1 tablet (10 mg total) by mouth 3 (three) times daily as needed for muscle spasms. Patient not taking: Reported on 06/18/2021 01/26/21   Dionne Bucy, MD  fenofibrate 160 MG tablet Take 160 mg by mouth at bedtime.     [provider]  glucose blood test strip 1 each by Other route as needed for other. Use as instructed    [provider]  hydrochlorothiazide (HYDRODIURIL) 25 MG tablet Take 25 mg by mouth daily.    [provider]  Lancets Misc. KIT by Does not apply route.    [provider]  levocetirizine Elita Boone) 5 MG tablet Take by mouth. 04/27/19 04/26/20  [provider]  levothyroxine (SYNTHROID) 125 MCG tablet  11/27/18   [provider]  liothyronine (CYTOMEL) 5 MCG tablet Take 5 mcg by mouth daily.    [provider]  liraglutide (VICTOZA) 18 MG/3ML SOPN Inject into the skin daily.  [provider]  metFORMIN (GLUCOPHAGE) 1000 MG tablet Take 1,000 mg by mouth 2 (two) times daily with a meal.    [provider]  metoprolol succinate (TOPROL-XL) 100 MG 24 hr tablet Take 100 mg by mouth daily. Take with or immediately following a meal.    [provider]  Misc Natural Products (LUTEIN 20 PO) Take 20 mg by mouth daily.    [provider]  ondansetron (ZOFRAN) 4 MG tablet Take 1 tablet (4 mg total) by mouth daily as needed for nausea or vomiting. 11/17/21 11/17/22  Pilar Jarvis, MD  phentermine 37.5 MG capsule Take 37.5 mg by mouth every morning.    [provider]  pioglitazone (ACTOS) 45 MG tablet Take 9 mg by mouth daily.    [provider]  QUEtiapine (SEROQUEL) 50 MG tablet Take 50 mg by mouth at bedtime.    [provider]  rosuvastatin (CRESTOR) 10 MG tablet Take by mouth at bedtime.    [provider]  venlafaxine XR (EFFEXOR-XR) 75 MG 24 hr capsule Take 75 mg by mouth daily with breakfast.    [provider]  vitamin  B-12 (CYANOCOBALAMIN) 1000 MCG tablet Take 1,000 mcg by mouth daily.    [provider]      VITAL SIGNS:  Blood pressure (!) 145/61, pulse 80, temperature 98.3 F (36.8 C), temperature source Oral, resp. rate 19, SpO2 94 %.  PHYSICAL EXAMINATION:  Physical Exam  GENERAL:  76 y.o.-year-old, Caucasian female patient lying in the bed with no acute distress.  EYES: Pupils equal, round, reactive to light and accommodation. No scleral icterus. Extraocular muscles intact.  HEENT: Head atraumatic, normocephalic. Oropharynx and nasopharynx clear.  NECK:  Supple, no jugular venous distention. No thyroid enlargement, no tenderness.  LUNGS: Normal breath sounds bilaterally, no wheezing, rales,rhonchi or crepitation. No use of accessory muscles of respiration.  CARDIOVASCULAR: Regular rate and rhythm, S1, S2 normal. No murmurs, rubs, or gallops.  ABDOMEN: Soft, nondistended, with generalized tenderness without rebound tenderness guarding or rigidity.  Bowel sounds were significantly diminished.  No organomegaly or mass.  EXTREMITIES: No pedal edema, cyanosis, or clubbing.  NEUROLOGIC: Cranial nerves II through XII are intact. Muscle strength 5/5 in all extremities. Sensation intact. Gait not checked.  PSYCHIATRIC: The patient is alert and oriented x 3.  Normal affect and good eye contact. SKIN: No obvious rash, lesion, or ulcer.   LABORATORY PANEL:   CBC Recent Labs  Lab 11/18/21 1644  WBC 9.9  HGB 12.0  HCT 36.7  PLT 452*   ------------------------------------------------------------------------------------------------------------------  Chemistries  Recent Labs  Lab 11/18/21 0101 11/18/21 1644  NA 137 133*  K 4.6 3.6  CL 103 97*  CO2 27 25  GLUCOSE 152* 165*  BUN 21 20  CREATININE 0.95 0.94  CALCIUM 8.9 9.6  MG 1.6*  --   AST  --  28  ALT  --  19  ALKPHOS  --  73  BILITOT  --  1.0    ------------------------------------------------------------------------------------------------------------------  Cardiac Enzymes No results for input(s): "TROPONINI" in the last 168 hours. ------------------------------------------------------------------------------------------------------------------  RADIOLOGY:  DG Abdomen 1 View  Result Date: 11/19/2021 CLINICAL DATA:  NG tube placement EXAM: ABDOMEN - 1 VIEW COMPARISON:  CT 11/18/2021 FINDINGS: Esophageal tube tip overlies the stomach, side-port in the region of GE junction. Dilated bowel in the upper abdomen IMPRESSION: Esophageal side-port is in the region of the GE junction. Consider further advancement for more optimal positioning Electronically Signed   By:  Jasmine Pang M.D.   On: 11/19/2021 00:18   CT Abdomen Pelvis W Contrast  Result Date: 11/18/2021 CLINICAL DATA:  Abdomen pain EXAM: CT ABDOMEN AND PELVIS WITH CONTRAST TECHNIQUE: Multidetector CT imaging of the abdomen and pelvis was performed using the standard protocol following bolus administration of intravenous contrast. RADIATION DOSE REDUCTION: This exam was performed according to the departmental dose-optimization program which includes automated exposure control, adjustment of the mA and/or kV according to patient size and/or use of iterative reconstruction technique. CONTRAST:  OMNIPAQUE IOHEXOL 300 MG/ML  SOLN COMPARISON:  CT 11/17/2021, 01/26/2021 FINDINGS: Lower chest: Lung bases demonstrate no acute airspace disease. Borderline cardiomegaly. Coronary vascular calcification. Small hiatal hernia Hepatobiliary: Status post cholecystectomy. No focal hepatic abnormality. Mild intra and extrahepatic biliary dilatation, common bile duct measures up to 10 mm. Pancreas: Atrophic.  No inflammation. Spleen: Normal in size without focal abnormality. Adrenals/Urinary Tract: Adrenal glands are normal. Subcentimeter hypodense renal lesions too small to further characterize.  No specific imaging follow-up recommended. No hydronephrosis. The bladder contains high density contrast. Stomach/Bowel: The stomach shows mild to moderate fluid distension. There is interval development of fluid-filled mildly dilated distal small bowel. There is persistent moderate fluid distension of the colon with caliber change at the sigmoid colon, series 2, image 69. The distal sigmoid and rectosigmoid colon are decompressed. Peripheral eyes to gas at the cecum appears generally similar. Vascular/Lymphatic: Moderate aortic atherosclerosis. No aneurysm. No suspicious lymph nodes Reproductive: Status post hysterectomy. No adnexal masses. Other: Negative for free air or free fluid Musculoskeletal: Grade 1 anterolisthesis L4 on L5 with degenerative changes. No acute osseous abnormality IMPRESSION: 1. Findings consistent with distal colon obstruction at the level of sigmoid colon either due to stricture or colon neoplasm. There is interval development of dilated fluid-filled distal small bowel, consistent with worsening obstruction. Peripheral gas at the cecum and ascending colon indeterminate for pneumatosis or peripheral intraluminal air, this is grossly unchanged. There is no evidence for perforation. 2. Status post cholecystectomy with mild intra and extrahepatic biliary dilatation. This may be correlated with LFTs Electronically Signed   By: Jasmine Pang M.D.   On: 11/18/2021 21:56   DG Chest 2 View  Result Date: 11/17/2021 CLINICAL DATA:  Infection; intermittent abdominal pain EXAM: CHEST - 2 VIEW COMPARISON:  None Available. FINDINGS: The heart size and mediastinal contours are within normal limits. Aortic atherosclerotic calcification. Bibasilar atelectasis. No pleural effusion or pneumothorax. No acute osseous abnormality. Degenerative arthritis both shoulders. IMPRESSION: No active cardiopulmonary disease. Electronically Signed   By: Minerva Fester M.D.   On: 11/17/2021 19:32   CT ABDOMEN PELVIS  W CONTRAST  Result Date: 11/17/2021 CLINICAL DATA:  Bloating.  Nausea and constipation. EXAM: CT ABDOMEN AND PELVIS WITH CONTRAST TECHNIQUE: Multidetector CT imaging of the abdomen and pelvis was performed using the standard protocol following bolus administration of intravenous contrast. RADIATION DOSE REDUCTION: This exam was performed according to the departmental dose-optimization program which includes automated exposure control, adjustment of the mA and/or kV according to patient size and/or use of iterative reconstruction technique. CONTRAST:  OMNIPAQUE IOHEXOL 300 MG/ML  SOLN COMPARISON:  01/26/2021 CT stone study. FINDINGS: Lower chest: Clear lung bases. Mild cardiomegaly with multivessel coronary artery calcification. Hepatobiliary: Normal liver. Cholecystectomy, without biliary ductal dilatation. Pancreas: Fatty replacement involving the head and uncinate process. No duct dilatation or acute inflammation. Spleen: Normal in size, without focal abnormality. Adrenals/Urinary Tract: Normal right adrenal gland. Mild left adrenal thickening. Mild renal cortical thinning bilaterally. Bilateral  too small to characterize renal lesions are most likely cysts . In the absence of clinically indicated signs/symptoms require(s) no independent follow-up. No hydronephrosis. Normal urinary bladder. Stomach/Bowel: Proximal gastric underdistention. Surgical sutures within the transverse duodenum. Otherwise normal small bowel. Wall thickening, accentuated by underdistention involving the sigmoid including on 67/2. The immediately upstream colon becomes dilated and fluid-filled including at up to 5.9 cm in the ascending segment. Subtle pericolonic edema is identified about the descending colon, including on 38/2. Probable small volume cecal pneumatosis, as evidenced by dependent gas on 61/2 and gas within the colonic wall on 60/2. Normal terminal ileum and appendix. Vascular/Lymphatic: Advanced aortic and branch  vessel atherosclerosis. Patent mesenteric vessels. No abdominopelvic adenopathy. Reproductive: Hysterectomy.  No adnexal mass. Other: No significant free fluid. Moderate pelvic floor laxity. No free intraperitoneal air. Musculoskeletal: Grade 2 L4-5 anterolisthesis.  Osteopenia. IMPRESSION: 1. Partial colonic obstruction at the level of the sigmoid. Wall thickening and underdistention, suspicious for an underlying carcinoma. A stricture secondary to prior diverticulitis could look similar. 2. Suspicion of cecal pneumatosis. Recommend laboratory/clinical correlation for bowel ischemia. 3. Subtle pericolonic edema involving the descending colon is likely secondary to distal obstruction. Concurrent diverticulitis could look similar. 4. No evidence of metastatic disease in the abdomen or pelvis. 5. Coronary artery atherosclerosis. Aortic Atherosclerosis (ICD10-I70.0). Electronically Signed   By: Jeronimo Greaves M.D.   On: 11/17/2021 18:28      IMPRESSION AND PLAN:  Assessment and Plan: * Large bowel obstruction (HCC) - The patient will be admitted to a surgical telemetry bed. - She will be kept NPO. - NG tube was inserted. - She will be hydrated with IV normal saline with added potassium chloride. - General surgery consult will be obtained. -Dr. Claudine Mouton was notified about the patient.  Hypomagnesemia Magnesium will be replaced.  Dyslipidemia - We will continue statin therapy as well as fenofibrate.  Hypothyroidism - We will continue Synthroid and Cytomel.  Controlled type 2 diabetes mellitus without complication, without long-term current use of insulin (HCC) - The patient will be placed on supplemental coverage with NovoLog. - We will continue Actos.  Anxiety and depression - We will continue BuSpar and Effexor XR as well as Seroquel  Essential hypertension - We will continue antihypertensives.       DVT prophylaxis: Lovenox.  Advanced Care Planning:  Code Status: The patient is  DNR/DNI.  This was discussed with her. Family Communication:  The plan of care was discussed in details with the patient (and family). I answered all questions. The patient agreed to proceed with the above mentioned plan. Further management will depend upon hospital course. Disposition Plan: Back to previous home environment Consults called: none.  All the records are reviewed and case discussed with ED provider.  Status is: Inpatient  At the time of the admission, it appears that the appropriate admission status for this patient is inpatient.  This is judged to be reasonable and necessary in order to provide the required intensity of service to ensure the patient's safety given the presenting symptoms, physical exam findings and initial radiographic and laboratory data in the context of comorbid conditions.  The patient requires inpatient status due to high intensity of service, high risk of further deterioration and high frequency of surveillance required.  I certify that at the time of admission, it is my clinical judgment that the patient will require inpatient hospital care extending more than 2 midnights.  Dispo: The patient is from: Home              Anticipated d/c is to: Home              Patient currently is not medically stable to d/c.              Difficult to place patient: No  Hannah Beat M.D on 11/19/2021 at 1:16 AM  [facility]   From 7 PM-7 AM, contact night-coverage [email or web address]  CC: Primary care physician; Marguarite Arbour, MD

## 2021-11-18 NOTE — ED Provider Triage Note (Signed)
Emergency Medicine Provider Triage Evaluation Note  Katelyn Simmons , a 76 y.o. female  was evaluated in triage.  Pt complains of worsening abdominal pain.  Seen here and discharged around 3 AM..  Review of Systems  Positive:  Negative:   Physical Exam  BP (!) 160/62 (BP Location: Right Arm)   Pulse 65   Temp 98 F (36.7 C) (Oral)   Resp 18   SpO2 100%  Gen:   Awake, no distress   Resp:  Normal effort  MSK:   Moves extremities without difficulty  Other:    Medical Decision Making  Medically screening exam initiated at 4:43 PM.  Appropriate orders placed.  Katelyn Simmons was informed that the remainder of the evaluation will be completed by another provider, this initial triage assessment does not replace that evaluation, and the importance of remaining in the ED until their evaluation is complete.     Versie Starks, PA-C 11/18/21 1643

## 2021-11-18 NOTE — Progress Notes (Signed)
PHARMACIST - PHYSICIAN ORDER COMMUNICATION  CONCERNING: P&T Medication Policy on Herbal Medications  DESCRIPTION:  This patient's order(s) for: Biotin 1 mg has been noted.  This product(s) is classified as an "herbal" or natural product. Due to a lack of definitive safety studies or FDA approval, nonstandard manufacturing practices, plus the potential risk of unknown drug-drug interactions while on inpatient medications, the Pharmacy and Therapeutics Committee does not permit the use of "herbal" or natural products of this type within [facility].   ACTION TAKEN: The pharmacy department is unable to verify this order at this time.  Please reevaluate patient's clinical condition at discharge and address if the herbal or natural product(s) should be resumed at that time.  Renda Rolls, PharmD, [facility] 11/18/2021 11:38 PM

## 2021-11-18 NOTE — ED Provider Notes (Signed)
[facility] Provider Note    Event Date/Time   First MD Initiated Contact with Patient 11/18/21 2037     (approximate)   History   Abdominal Pain   HPI  Nyonna L Radloff is a 76 y.o. female   Past medical history of diverticulitis, CKD, GERD, kidney stones, diabetes, TIA and hypothyroid who was recently seen in the emergency department with a large bowel partial obstruction in the sigmoid colon concerning for diverticulitis and was sent home with p.o. antibiotics Augmentin, comes in today with worsening abdominal pain nausea and vomiting.  Show large bowel obstruction in the setting of suspected diverticulitis who returns to the emergency department after this diagnosis was made yesterday, started on Augmentin, with increased abdominal pain, nausea and vomiting of suspected feculent material.  She did make a bowel movement today which was watery, no blood.  She is passing gas.  History was obtained via patient and review of external medical notes including emergency department visit yesterday      Physical Exam   Triage Vital Signs: ED Triage Vitals  Enc Vitals Group     BP 11/18/21 1641 (!) 160/62     Pulse Rate 11/18/21 1641 65     Resp 11/18/21 1641 18     Temp 11/18/21 1641 98 F (36.7 C)     Temp Source 11/18/21 1641 Oral     SpO2 11/18/21 1641 100 %     Weight --      Height --      Head Circumference --      Peak Flow --      Pain Score 11/18/21 1645 0     Pain Loc --      Pain Edu? --      Excl. in [place]? --     Most recent vital signs: Vitals:   11/18/21 2115 11/18/21 2122  BP: (!) 146/55   Pulse: 68   Resp:    Temp:  98.3 F (36.8 C)  SpO2: 100%     General: Awake, no distress.  CV:  Good peripheral perfusion.  Resp:  Normal effort.  Abd:  No distention.  Tenderness to palpation throughout, no rigidity or guarding. Other:  Overall nontoxic-appearing, pleasant   ED Results / Procedures / Treatments   Labs (all labs  ordered are listed, but only abnormal results are displayed) Labs Reviewed  COMPREHENSIVE METABOLIC PANEL - Abnormal; Notable for the following components:      Result Value   Sodium 133 (*)    Chloride 97 (*)    Glucose, Bld 165 (*)    All other components within normal limits  CBC WITH DIFFERENTIAL/PLATELET - Abnormal; Notable for the following components:   Platelets 452 (*)    Neutro Abs 8.4 (*)    Lymphs Abs 0.6 (*)    All other components within normal limits  CBG MONITORING, ED - Abnormal; Notable for the following components:   Glucose-Capillary 134 (*)    All other components within normal limits  LIPASE, BLOOD  URINALYSIS, ROUTINE W REFLEX MICROSCOPIC     I reviewed labs and they are notable for normal white blood cell count  RADIOLOGY I independently reviewed and interpreted CT scan of the abdomen pelvis and see some dilated loops of bowel   PROCEDURES:  Critical Care performed: No  Procedures   MEDICATIONS ORDERED IN ED: Medications  cefTRIAXone (ROCEPHIN) 1 g in sodium chloride 0.9 % 100 mL IVPB (has no administration in time  range)  metroNIDAZOLE (FLAGYL) IVPB 500 mg (has no administration in time range)  ondansetron (ZOFRAN) injection 4 mg (4 mg Intravenous Given 11/18/21 2122)  morphine (PF) 4 MG/ML injection 4 mg (4 mg Intravenous Given 11/18/21 2121)  sodium chloride 0.9 % bolus 1,000 mL (1,000 mLs Intravenous New Bag/Given 11/18/21 2121)  iohexol (OMNIPAQUE) 300 MG/ML solution 100 mL (100 mLs Intravenous Contrast Given 11/18/21 2137)    Consultants:  I spoke with Dr. Christian Mate of surgery regarding care plan for this patient.   IMPRESSION / MDM / ASSESSMENT AND PLAN / ED COURSE  I reviewed the triage vital signs and the nursing notes.                              Differential diagnosis includes, but is not limited to, large bowel obstruction due to malignancy versus diverticulitis   MDM: Patient with partial large bowel obstruction, not likely  complete given passing of stools and flatus today.  She is vomiting suspected feculent material by patient, given antiemetic and will give NG tube for decompression.  NPO.  Medical admission with surgery consultation in the morning.  Sinew IV antibiotics for diverticulitis in case this is the etiology for colonic stricture as suspected in ED visit yesterday  Patient's presentation is most consistent with acute presentation with potential threat to life or bodily function.       FINAL CLINICAL IMPRESSION(S) / ED DIAGNOSES   Final diagnoses:  Large bowel obstruction ([place])     Rx / DC Orders   ED Discharge Orders     None        Note:  This document was prepared using Dragon voice recognition software and may include unintentional dictation errors.    Lucillie Garfinkel, MD 11/18/21 2218

## 2021-11-18 NOTE — ED Triage Notes (Signed)
Pt comes with c/o continued belly pain. Pt states this started yesterday and was seen for it. Pt states CT showed small bowel blockage.  Pt was dc home.  Pt states spasms has continued and came back to get evaluated.

## 2021-11-19 ENCOUNTER — Inpatient Hospital Stay: Payer: Medicare HMO

## 2021-11-19 ENCOUNTER — Inpatient Hospital Stay: Payer: Self-pay

## 2021-11-19 DIAGNOSIS — E119 Type 2 diabetes mellitus without complications: Secondary | ICD-10-CM

## 2021-11-19 DIAGNOSIS — K56609 Unspecified intestinal obstruction, unspecified as to partial versus complete obstruction: Secondary | ICD-10-CM | POA: Diagnosis not present

## 2021-11-19 DIAGNOSIS — F32A Depression, unspecified: Secondary | ICD-10-CM

## 2021-11-19 DIAGNOSIS — E876 Hypokalemia: Secondary | ICD-10-CM | POA: Diagnosis not present

## 2021-11-19 DIAGNOSIS — E871 Hypo-osmolality and hyponatremia: Secondary | ICD-10-CM

## 2021-11-19 DIAGNOSIS — F419 Anxiety disorder, unspecified: Secondary | ICD-10-CM

## 2021-11-19 LAB — BASIC METABOLIC PANEL
Anion gap: 9 (ref 5–15)
BUN: 17 mg/dL (ref 8–23)
CO2: 26 mmol/L (ref 22–32)
Calcium: 9 mg/dL (ref 8.9–10.3)
Chloride: 101 mmol/L (ref 98–111)
Creatinine, Ser: 0.94 mg/dL (ref 0.44–1.00)
GFR, Estimated: >60 mL/min (ref >60)
Glucose, Bld: 133 mg/dL — ABNORMAL HIGH (ref 70–99)
Potassium: 3.1 mmol/L — ABNORMAL LOW (ref 3.5–5.1)
Sodium: 136 mmol/L (ref 135–145)

## 2021-11-19 LAB — CBC
HCT: 34.2 % — ABNORMAL LOW (ref 36.0–46.0)
Hemoglobin: 11.1 g/dL — ABNORMAL LOW (ref 12.0–15.0)
MCH: 30.2 pg (ref 26.0–34.0)
MCHC: 32.5 g/dL (ref 30.0–36.0)
MCV: 92.9 fL (ref 80.0–100.0)
Platelets: 376 10*3/uL (ref 150–400)
RBC: 3.68 MIL/uL — ABNORMAL LOW (ref 3.87–5.11)
RDW: 14.9 % (ref 11.5–15.5)
WBC: 9 10*3/uL (ref 4.0–10.5)
nRBC: 0 % (ref 0.0–0.2)

## 2021-11-19 LAB — GLUCOSE, CAPILLARY
Glucose-Capillary: 101 mg/dL — ABNORMAL HIGH (ref 70–99)
Glucose-Capillary: 103 mg/dL — ABNORMAL HIGH (ref 70–99)
Glucose-Capillary: 111 mg/dL — ABNORMAL HIGH (ref 70–99)
Glucose-Capillary: 115 mg/dL — ABNORMAL HIGH (ref 70–99)
Glucose-Capillary: 117 mg/dL — ABNORMAL HIGH (ref 70–99)
Glucose-Capillary: 141 mg/dL — ABNORMAL HIGH (ref 70–99)
Glucose-Capillary: 93 mg/dL (ref 70–99)

## 2021-11-19 MED ORDER — INSULIN ASPART 100 UNIT/ML IJ SOLN: 0.0000 [IU] | Freq: Every day | INTRAMUSCULAR | Status: DC

## 2021-11-19 MED ORDER — MAGNESIUM SULFATE 2 GM/50ML IV SOLN
2.0000 g | Freq: Once | INTRAVENOUS | Status: AC
  Administered 2021-11-19: 2 g via INTRAVENOUS
  Filled 2021-11-19: qty 50

## 2021-11-19 MED ORDER — POTASSIUM CHLORIDE IN NACL 20-0.9 MEQ/L-% IV SOLN
INTRAVENOUS | Status: AC
  Filled 2021-11-19 (×3): qty 1000

## 2021-11-19 MED ORDER — SODIUM CHLORIDE 0.9 % IV SOLN
2.0000 g | INTRAVENOUS | Status: AC
  Administered 2021-11-20: 2 g via INTRAVENOUS
  Filled 2021-11-19 (×2): qty 2

## 2021-11-19 MED ORDER — POTASSIUM CHLORIDE 10 MEQ/100ML IV SOLN
10.0000 meq | INTRAVENOUS | Status: AC
  Administered 2021-11-19 (×2): 10 meq via INTRAVENOUS
  Filled 2021-11-19 (×2): qty 100

## 2021-11-19 MED ORDER — INSULIN ASPART 100 UNIT/ML IJ SOLN
0.0000 [IU] | INTRAMUSCULAR | Status: DC
  Administered 2021-11-20 – 2021-11-22 (×4): 1 [IU] via SUBCUTANEOUS
  Administered 2021-11-22 (×2): 2 [IU] via SUBCUTANEOUS
  Administered 2021-11-22 (×2): 1 [IU] via SUBCUTANEOUS
  Administered 2021-11-23 (×6): 2 [IU] via SUBCUTANEOUS
  Administered 2021-11-24: 1 [IU] via SUBCUTANEOUS
  Administered 2021-11-24 – 2021-11-25 (×6): 2 [IU] via SUBCUTANEOUS
  Administered 2021-11-25 (×2): 1 [IU] via SUBCUTANEOUS
  Administered 2021-11-25 (×2): 2 [IU] via SUBCUTANEOUS
  Administered 2021-11-25 – 2021-11-26 (×2): 1 [IU] via SUBCUTANEOUS
  Filled 2021-11-19 (×27): qty 1

## 2021-11-19 NOTE — Assessment & Plan Note (Addendum)
Will improve with advancing diet.

## 2021-11-19 NOTE — Assessment & Plan Note (Addendum)
Continue Synthroid and Cytomel.

## 2021-11-19 NOTE — Assessment & Plan Note (Addendum)
Continue Crestor 

## 2021-11-19 NOTE — Assessment & Plan Note (Signed)
-   We will continue BuSpar and Effexor XR as well as Seroquel

## 2021-11-19 NOTE — Consult Note (Addendum)
[facility] SURGICAL CONSULTATION NOTE (initial) - cpt: 10272   HISTORY OF PRESENT ILLNESS (HPI):  76 y.o. female presented to [facility] ED yesterday for evaluation of abdominal pain. Patient reports that over the last 3 weeks she has had progressive generalized crampy abdominal pain with nausea and emesis. She started to feel improvement but then acute worsening at the start of the week which prompted her presentation. No fever, chills, cough, CP, SOB, or urinary changes. She did feel her emesis had turned feculent in last 24 hours. She does have a history of diverticulitis in the past. She reports "many small episodes and one really bad one." Last colonoscopy was om September 2020 which showed a benign appearing intrinsic stricture. Previous intra-abdominal surgeries are positive for abdominal hysterectomy, cholecystectomy and what appears to be resection of diverticulum of the 3rd portion of the duodenum (2016). She initially presented to ED on 10/17 for these symptoms. She was worked up with labs which were reassuring. CT Abdomen/Pelvis showed dilated colon with known stricture of the sigmoid colon. She was thought to have diverticulitis and was given PO Augmentin and surgery follow up. Unfortunately, at home she continued to worsen regarding distension, nausea, and emesis and re-presented to the ED. Most recent work up revealed a normal WBC at 9.0K, Hgb to 11.1, renal function normal with sCr - 0.94, hypokalemia to 3.1. CT Abdomen/Pelvis again showed colonic dilation with stricture of sigmoid colon; however, now with small bowel dilation as well. She was admitted to the medicine service and NGT placed.   Surgery is consulted by hospitalist physician Dr. Sharen Hones, MD in this context for evaluation and management of large bowel obstruction.  When I saw the patient this afternoon, she reports that she is feeling better. Distension is improving and she is now longer with any abdominal pain.  NGT with about 200 ccs out; clearing. She reports she has had 4 bowel movements yesterday but nothing, including flatus, today.    PAST MEDICAL HISTORY (PMH):  Past Medical History:  Diagnosis Date   Abnormal finding on EKG    Anemia    pernicious anemia   Anxiety    Arthritis    everywhere   B12 deficiency    Barrett's esophagus    CKD (chronic kidney disease)    stage III ckd   Collagen vascular disease (HCC)    RA   Depression    Diabetes mellitus without complication (HCC)    Dysphagia    Dyspnea    with exertion   Dysrhythmia    sinus arrythmia or just stops momentarily, per patient   Esophageal stricture    Fatty liver    GERD (gastroesophageal reflux disease)    Goiter, nontoxic, multinodular    History of kidney stones    Hx of repair of left rotator cuff    Hyperlipidemia    Hypertension    Hypothyroidism    Neuromuscular disorder (HCC)    fibromyalgia   Obesity    Pain    Rotator cuff tear arthropathy of both shoulders    Shingles 2008   Stroke [facility])    mini strokes about 20 years ago.  some memory loss residual   Throat pain      PAST SURGICAL HISTORY ([place]):  Past Surgical History:  Procedure Laterality Date   ABDOMINAL HYSTERECTOMY     CHOLECYSTECTOMY     endoscopic carpal tunnel release Bilateral    ESOPHAGOGASTRODUODENOSCOPY     ESOPHAGOGASTRODUODENOSCOPY (EGD) WITH PROPOFOL N/A 03/31/2016  Procedure: ESOPHAGOGASTRODUODENOSCOPY (EGD) WITH PROPOFOL;  Surgeon: Manya Silvas, MD;  Location: [facility] ENDOSCOPY;  Service: Endoscopy;  Laterality: N/A;   LUMBAR LAMINECTOMY/DECOMPRESSION MICRODISCECTOMY N/A 10/25/2018   Procedure: LUMBAR LAMINECTOMY/DECOMPRESSION MICRODISCECTOMY 2 LEVELS L3-5;  Surgeon: Meade Maw, MD;  Location: [facility] ORS;  Service: Neurosurgery;  Laterality: N/A;   UPPER GI ENDOSCOPY  10/2018   light run     MEDICATIONS:  Prior to Admission medications   Medication Sig Start Date End Date Taking? Authorizing Provider   amoxicillin-clavulanate (AUGMENTIN) 875-125 MG tablet Take 1 tablet by mouth 2 (two) times daily for 10 days. 11/17/21 11/27/21  Lucillie Garfinkel, MD  aspirin EC 81 MG tablet Take 81 mg by mouth every evening.    [provider]  Biotin 1000 MCG tablet Take 1,000 mcg by mouth daily.    [provider]  Blood Glucose Monitoring Suppl (GMATE SMART METER KIT) DEVI by Does not apply route.    [provider]  busPIRone (BUSPAR) 10 MG tablet Take 10 mg by mouth 2 (two) times daily.    [provider]  Cholecalciferol 25 MCG (1000 UT) capsule Take 1,000 Units by mouth every evening.     [provider]  cyclobenzaprine (FLEXERIL) 10 MG tablet Take 1 tablet (10 mg total) by mouth 3 (three) times daily as needed for muscle spasms. Patient not taking: Reported on 06/18/2021 01/26/21   Arta Silence, MD  fenofibrate 160 MG tablet Take 160 mg by mouth at bedtime.     [provider]  glucose blood test strip 1 each by Other route as needed for other. Use as instructed    [provider]  hydrochlorothiazide (HYDRODIURIL) 25 MG tablet Take 25 mg by mouth daily.    [provider]  Lancets Misc. KIT by Does not apply route.    [provider]  levocetirizine Harlow Ohms) 5 MG tablet Take by mouth. 04/27/19 04/26/20  [provider]  levothyroxine (SYNTHROID) 125 MCG tablet  11/27/18   [provider]  liothyronine (CYTOMEL) 5 MCG tablet Take 5 mcg by mouth daily.    [provider]  liraglutide (VICTOZA) 18 MG/3ML SOPN Inject into the skin daily.    [provider]  metFORMIN (GLUCOPHAGE) 1000 MG tablet Take 1,000 mg by mouth 2 (two) times daily with a meal.    [provider]  metoprolol succinate (TOPROL-XL) 100 MG 24 hr tablet Take 100 mg by mouth daily. Take with or immediately following a meal.    [provider]  Misc Natural Products (LUTEIN 20 PO) Take 20 mg by mouth daily.     [provider]  ondansetron (ZOFRAN) 4 MG tablet Take 1 tablet (4 mg total) by mouth daily as needed for nausea or vomiting. 11/17/21 11/17/22  Lucillie Garfinkel, MD  phentermine 37.5 MG capsule Take 37.5 mg by mouth every morning.    [provider]  pioglitazone (ACTOS) 45 MG tablet Take 9 mg by mouth daily.    [provider]  QUEtiapine (SEROQUEL) 50 MG tablet Take 50 mg by mouth at bedtime.    [provider]  rosuvastatin (CRESTOR) 10 MG tablet Take by mouth at bedtime.    [provider]  venlafaxine XR (EFFEXOR-XR) 75 MG 24 hr capsule Take 75 mg by mouth daily with breakfast.    [provider]  vitamin B-12 (CYANOCOBALAMIN) 1000 MCG tablet Take 1,000 mcg by mouth daily.    [provider]     ALLERGIES:  Allergies  Allergen Reactions   Sulfa Antibiotics Swelling    Swelling of face   Eggs Or Egg-Derived Products Nausea And Vomiting   Methocarbamol Other (See Comments)    Lips felt swollen, but were not   Parafon Forte Dsc [Chlorzoxazone] Other (See Comments)    Unknown reaction. Patient does not remember taking this medication   Codeine Other (See Comments)    Jittery      SOCIAL HISTORY:  Social History   Socioeconomic History   Marital status: Widowed    Spouse name: Jimmie   Number of children: Not on file   Years of education: Not on file   Highest education level: Not on file  Occupational History   Occupation: Management consultant, owned [employer]  Tobacco Use   Smoking status: Never   Smokeless tobacco: Never  Vaping Use   Vaping Use: Never used  Substance and Sexual Activity   Alcohol use: Yes    Comment: rare   Drug use: No   Sexual activity: Not on file  Other Topics Concern   Not on file  Social History Narrative   Not on file   Social Determinants of Health   Financial Resource Strain: Medium Risk (10/27/2018)   Overall Financial Resource Strain (CARDIA)    Difficulty of Paying Living  Expenses: Somewhat hard  Food Insecurity: Food Insecurity Present (11/19/2021)   Hunger Vital Sign    Worried About [place] in the Last Year: Sometimes true    Ran Out of Food in the Last Year: Sometimes true  Transportation Needs: No Transportation Needs (11/19/2021)   PRAPARE - Hydrologist (Medical): No    Lack of Transportation (Non-Medical): No  Physical Activity: Not on file  Stress: Not on file  Social Connections: Not on file  Intimate Partner Violence: Not At Risk (11/19/2021)   Humiliation, Afraid, Rape, and Kick questionnaire    Fear of Current or Ex-Partner: No    Emotionally Abused: No    Physically Abused: No    Sexually Abused: No     FAMILY HISTORY:  Family History  Problem Relation Age of Onset   Breast cancer Neg Hx       REVIEW OF SYSTEMS:  Review of Systems  Constitutional:  Negative for chills and fever.  HENT:  Negative for congestion and sore throat.   Respiratory:  Negative for shortness of breath.   Cardiovascular:  Negative for chest pain and palpitations.  Gastrointestinal:  Positive for abdominal pain, diarrhea, nausea and vomiting. Negative for blood in stool and constipation.  Genitourinary:  Negative for dysuria and urgency.  All other systems reviewed and are negative.   VITAL SIGNS:  Temp:  [98 F (36.7 C)-98.6 F (37 C)] 98.6 F (37 C) (10/19 0429) Pulse Rate:  [65-80] 73 (10/19 0739) Resp:  [18-20] 20 (10/19 0739) BP: (135-161)/(55-67) 161/67 (10/19 0739) SpO2:  [94 %-100 %] 100 % (10/19 0739) Weight:  [72.3 kg] 72.3 kg (10/19 0138)     Height: _0  (165.1 cm) Weight: 72.3 kg     INTAKE/OUTPUT:  10/18 0701 - 10/19 0700 In: 1100 [IV Piggyback:1100] Out: -   PHYSICAL EXAM:  Physical Exam Vitals and nursing note reviewed. Exam conducted with a chaperone present.  Constitutional:      General: She is not in acute distress.    Appearance: She is well-developed. She is obese. She is not  ill-appearing.     Comments: Sitting up in  bed, NAD. Son present at bedside   HENT:     Head: Normocephalic and atraumatic.     Comments: NGT in place  Eyes:     General: No scleral icterus.    Extraocular Movements: Extraocular movements intact.  Cardiovascular:     Rate and Rhythm: Normal rate and regular rhythm.     Heart sounds: Normal heart sounds.  Pulmonary:     Effort: Pulmonary effort is normal. No respiratory distress.     Breath sounds: Normal breath sounds.  Abdominal:     General: A surgical scar is present. There is distension.     Palpations: Abdomen is soft.     Tenderness: There is no abdominal tenderness. There is no guarding or rebound.  Genitourinary:    Comments: Deferred Skin:    General: Skin is warm and dry.     Coloration: Skin is not jaundiced or pale.  Neurological:     General: No focal deficit present.     Mental Status: She is alert and oriented to person, place, and time.  Psychiatric:        Mood and Affect: Mood normal.        Behavior: Behavior normal.      Labs:     Latest Ref Rng & Units 11/19/2021    5:56 AM 11/18/2021    4:44 PM 11/17/2021    7:11 PM  CBC  WBC 4.0 - 10.5 K/uL 9.0  9.9  11.2   Hemoglobin 12.0 - 15.0 g/dL 11.1  12.0  12.4   Hematocrit 36.0 - 46.0 % 34.2  36.7  38.3   Platelets 150 - 400 K/uL 376  452  451       Latest Ref Rng & Units 11/19/2021    5:56 AM 11/18/2021    4:44 PM 11/18/2021    1:01 AM  CMP  Glucose 70 - 99 mg/dL 133  165  152   BUN 8 - 23 mg/dL _0 Creatinine 0.44 - 1.00 mg/dL 0.94  0.94  0.95   Sodium 135 - 145 mmol/L 136  133  137   Potassium 3.5 - 5.1 mmol/L 3.1  3.6  4.6   Chloride 98 - 111 mmol/L 101  97  103   CO2 22 - 32 mmol/L _1 Calcium 8.9 - 10.3 mg/dL 9.0  9.6  8.9   Total Protein 6.5 - 8.1 g/dL  7.9    Total Bilirubin 0.3 - 1.2 mg/dL  1.0    Alkaline Phos 38 - 126 U/L  73    AST 15 - 41 U/L  28    ALT 0 - 44 U/L  19       Imaging studies:   CT  Abdomen/Pelvis (11/18/2021) personally reviewed which continues to show colonic dilation with similar appearance of sigmoid stricture, now with small bowel dilation as well, and radiologist report reviewed below;  IMPRESSION: 1. Findings consistent with distal colon obstruction at the level of sigmoid colon either due to stricture or colon neoplasm. There is interval development of dilated fluid-filled distal small bowel, consistent with worsening obstruction. Peripheral gas at the cecum and ascending colon indeterminate for pneumatosis or peripheral intraluminal air, this is grossly unchanged. There is no evidence for perforation. 2. Status post cholecystectomy with mild intra and extrahepatic biliary dilatation. This may be correlated with LFTs   CT Abdomen/Pelvis (11/17/2021) personally reviewed which shows pan-colonic dilation with fluid, there is  area of narrowing in the sigmoid colon which appears similar in appearance to scan in 2022 although colonic distension is new, there does appear to be fluid in the rectum, no free air, and radiologist report reviewed below;  IMPRESSION: 1. Partial colonic obstruction at the level of the sigmoid. Wall thickening and underdistention, suspicious for an underlying carcinoma. A stricture secondary to prior diverticulitis could look similar. 2. Suspicion of cecal pneumatosis. Recommend laboratory/clinical correlation for bowel ischemia. 3. Subtle pericolonic edema involving the descending colon is likely secondary to distal obstruction. Concurrent diverticulitis could look similar. 4. No evidence of metastatic disease in the abdomen or pelvis. 5. Coronary artery atherosclerosis. Aortic Atherosclerosis (ICD10-I70.0).   Assessment/Plan: (ICD-10's: K4.609) 76 y.o. female with abdominal distension, nausea, and emesis found to have likely partial large bowel obstruction secondary to stricture at the sigmoid colon which is likely related to  diverticular disease; although of course malignancy is possible   - I do agree that this stenosis of the sigmoid colon is likely secondary to her diverticular disease given her history and normal colonoscopy (aside from stricture seen then as well) in 2020; however, malignancy is still certainly a potential diagnosis. She fortunately seems to have had continued bowel function as of yesterday, her abdominal examination is reassuring aside from expected distension, and she is without leukocytosis. I have low suspicion clinically for bowel compromise at this current time. I do not think she warrants emergent intervention; however, I do fear that she may require strong consideration of resection this admission vs in the close outpatient setting. I will ultimately defer this to my attendings of course. She, and son at bedside, understand that surgical intervention may involve laparotomy vs laparoscopy, colectomy, and possible colostomy vs diverting ileostomy.     - Appreciate medicine admission - Would continue NGT decompression for now; monitor and record output - Monitor abdominal examination; on-going bowel function - Consider serial KUBs; BE may be beneficial as well to confirm this is not complete obstruction  - Not sure GI involvement is needed; could consider sigmoidoscopy but in setting of large bowel obstruction I believe endoscopic evaluation is contraindicated    - Pain control prn; antiemetics prn  - Mobilize as tolerated  - Further management per primary service; we will of course follow   All of the above findings and recommendations were discussed with the patient and her family, and all of their questions were answered to their expressed satisfaction.  Thank you for the opportunity to participate in this patient's care.   -- Edison Simon, PA-C [facility] 11/19/2021, 1:17 PM M-F: 7am - 4pm

## 2021-11-19 NOTE — Assessment & Plan Note (Addendum)
Postoperative day 6.  Having output out of the ostomy.  Ambulating.  Tolerating advance diet. Cleared by surgery to go home.  Home health ostomy nurse.  Surgery will follow-up next week to remove staples and drain.

## 2021-11-19 NOTE — Plan of Care (Signed)
  Problem: Nutrition: Goal: Adequate nutrition will be maintained Outcome: Progressing   Problem: Pain Managment: Goal: General experience of comfort will improve Outcome: Progressing   Problem: Skin Integrity: Goal: Risk for impaired skin integrity will decrease Outcome: Progressing   

## 2021-11-19 NOTE — TOC Initial Note (Signed)
Transition of Care [facility]) - Initial/Assessment Note    Patient Details  Name: Katelyn Simmons MRN: [id number] Date of Birth: [birth year]  Transition of Care [facility]) CM/SW Contact:    Laurena Slimmer, RN Phone Number: 11/19/2021, 3:48 PM  Clinical Narrative:                 Spoke with patient and her son at the bedside.  Admitted for: bowel obstruction Admitted from:Home (alone)  MPN:[id number]  Pharmacy:[facility]  Current home health/prior home health/DME:Cane, walker Transportation: Son will transport home/ takes her to her appointments   Expected Discharge Plan: [place] Barriers to Discharge: Continued Medical Work up   Patient Goals and CMS Choice Patient states their goals for this hospitalization and ongoing recovery are:: To return home      Expected Discharge Plan and Services Expected Discharge Plan: [place]       Living arrangements for the past 2 months: Single Family Home                                      Prior Living Arrangements/Services Living arrangements for the past 2 months: Single Family Home Lives with:: Self   Do you feel safe going back to the place where you live?: Yes      Need for Family Participation in Patient Care: Yes (Comment) Care giver support system in place?: Yes (comment)   Criminal Activity/Legal Involvement Pertinent to Current Situation/Hospitalization: No - Comment as needed  Activities of Daily Living Home Assistive Devices/Equipment: Cane (specify quad or straight), Contact lenses ADL Screening (condition at time of admission) Patient's cognitive ability adequate to safely complete daily activities?: Yes Is the patient deaf or have difficulty hearing?: No Does the patient have difficulty seeing, even when wearing glasses/contacts?: No Does the patient have difficulty concentrating, remembering, or making decisions?: No Patient able to express need for assistance with  ADLs?: Yes Does the patient have difficulty dressing or bathing?: No Independently performs ADLs?: Yes (appropriate for developmental age) Does the patient have difficulty walking or climbing stairs?: No Weakness of Legs: None Weakness of Arms/Hands: None  Permission Sought/Granted                  Emotional Assessment Appearance:: Appears stated age Attitude/Demeanor/Rapport: Gracious, Engaged Affect (typically observed): Accepting Orientation: : Oriented to Self, Oriented to Place, Oriented to  Time, Oriented to Situation Alcohol / Substance Use: Not Applicable Psych Involvement: No (comment)  Admission diagnosis:  Large bowel obstruction ([place]) [K56.609] Patient Active Problem List   Diagnosis Date Noted   Anxiety and depression 11/19/2021   Controlled type 2 diabetes mellitus without complication, without long-term current use of insulin ([place]) 11/19/2021   Hypomagnesemia 11/19/2021   Hyponatremia 11/19/2021   Large bowel obstruction ([place]) 11/18/2021   Type 2 diabetes mellitus with peripheral neuropathy ([place]) 06/18/2021   Dyslipidemia 06/18/2021   Hypothyroidism 06/18/2021   GERD without esophagitis 06/18/2021   Depression 06/18/2021   Hypokalemia 06/18/2021   Overweight (BMI 25.0-29.9) 06/18/2021   Transient global amnesia 06/17/2021   Hypercholesterolemia 07/08/2020   Diabetes mellitus ([place]) 07/08/2020   S/P lumbar laminectomy 10/25/2018   Essential hypertension 02/20/2015   PCP:  Idelle Crouch, MD Pharmacy:   [facility] [address] [place] [address] Phone: [phone] Fax: [phone]  [id number] [address], [address] [place] [address] Phone: [phone] Fax: [phone]     Social Determinants of Health (SDOH) Interventions    Readmission Risk Interventions     No data to display

## 2021-11-19 NOTE — Hospital Course (Addendum)
Katelyn Simmons is a 76 y.o. Caucasian female with medical history significant for anxiety, osteoarthritis, stage III chronic kidney disease, rheumatoid arthritis, type 2 diabetes mellitus, depression, hypertension, dyslipidemia, GERD, hypothyroidism and CVA, who presented to the emergency room with acute onset of abdominal pain all over with associated abdominal distention that started about 3 weeks ago.  Abdominal pain worsened for the last 3 days, she also started having nausea vomiting.  However, she still had a several loose stools yesterday. CT scan showed colon obstruction at the sigmoid level.  Patient was placed on IV fluids, n.p.o. and NG suction.  General surgery consult obtained.  Patient going to surgery 10/20.

## 2021-11-19 NOTE — Progress Notes (Signed)
  Progress Note   Patient: Katelyn Simmons DOB: 07-17-1945 DOA: 11/18/2021     1 DOS: the patient was seen and examined on 11/19/2021   Brief hospital course: Katelyn Simmons is a 76 y.o. Caucasian female with medical history significant for anxiety, osteoarthritis, stage III chronic kidney disease, rheumatoid arthritis, type 2 diabetes mellitus, depression, hypertension, dyslipidemia, GERD, hypothyroidism and CVA, who presented to the emergency room with acute onset of abdominal pain all over with associated abdominal distention that started about 3 weeks ago.  Abdominal pain worsened for the last 3 days, she also started having nausea vomiting.  However, she still had a several loose stools yesterday. CT scan showed colon obstruction at the sigmoid level.  Patient was placed on IV fluids, n.p.o. and NG suction.  General surgery consult obtained.  Assessment and Plan: * Large bowel obstruction [facility]) Patient had a colonoscopy about 2 years ago without malignancy.  She also had a recent diverticulitis, treated with antibiotics.  The cause most likely is due to stricture from recent diverticulitis. I will continue IV fluids, NG suction. Pending general surgery consult.  Hypomagnesemia Hypokalemia. Magnesium repleted last night, will give potassium today, also change fluids to normal saline with potassium.  Recheck BMP and magnesium tomorrow.   Hypothyroidism On Synthroid.  Controlled type 2 diabetes mellitus without complication, without long-term current use of insulin (HCC) Continue sliding scale insulin.  Anxiety and depression Continue antidepressants.  Essential hypertension Continue metoprolol.       Subjective:  Patient doing better today, abdominal pain is better, no nausea vomiting.  Physical Exam: Vitals:   11/19/21 0138 11/19/21 0139 11/19/21 0429 11/19/21 0739  BP:  (!) 145/61 135/60 (!) 161/67  Pulse:  80 80 73  Resp:  '19 20 20  '$ Temp:  98.3 F (36.8  C) 98.6 F (37 C)   TempSrc:  Oral Oral   SpO2:   98% 100%  Weight: 72.3 kg     Height:  '5\' 5"'$  (1.651 m)     General exam: Appears calm and comfortable  Respiratory system: Clear to auscultation. Respiratory effort normal. Cardiovascular system: S1 & S2 heard, RRR. No JVD, murmurs, rubs, gallops or clicks. No pedal edema. Gastrointestinal system: Abdomen is distended, soft and nontender. No organomegaly or masses felt.  Bowel sounds appear to be normal now. Central nervous system: Alert and oriented. No focal neurological deficits. Extremities: Symmetric 5 x 5 power. Skin: No rashes, lesions or ulcers Psychiatry: Judgement and insight appear normal. Mood & affect appropriate.   Data Reviewed:  Reviewed CT scan results, lab results.  Family Communication: Husband and daughter at the bedside, updated.  Disposition: Status is: Inpatient Remains inpatient appropriate because: Severity of disease, IV treatment.  Planned Discharge Destination: Home with Home Health    Time spent: 35 minutes  Author: Sharen Hones, MD 11/19/2021 11:54 AM  For on call review [email or web address].

## 2021-11-19 NOTE — Progress Notes (Signed)
Initial Nutrition Assessment  DOCUMENTATION CODES:   Not applicable  INTERVENTION:   -TPN management per pharmacy; to be initiated on 11/20/20  NUTRITION DIAGNOSIS:   Inadequate oral intake related to altered GI function as evidenced by NPO status.  GOAL:   Patient will meet greater than or equal to 90% of their needs  MONITOR:   Diet advancement  REASON FOR ASSESSMENT:   Consult New TPN/TNA  ASSESSMENT:   Pt with medical history significant for anxiety, osteoarthritis, stage III chronic kidney disease, rheumatoid arthritis, type 2 diabetes mellitus, depression, hypertension, dyslipidemia, GERD, hypothyroidism and CVA, who presented with acute onset of abdominal pain all over with associated abdominal distention that started about 3 weeks ago.  Pt admitted with large bowel obstruction.   10/18- NGT placed for decompression  Reviewed I/O's: +1.1 L x 24 hours  NGT output: 100 ml x 24 hours  Per general surgery notes, CT scan on 11/17/21 revealed stricture of sigmoid colon with distention. Pt now with increased distention of the bowel. Plan for ex lap and Hartmann's tomorrow (highly unlikely that primary anastomosis can be done secondary to difference in caliber of bowel proximal and distant to stricture). CWOCN has been ordered for end colostomy making and OR scheduled for tomorrow (11/20/21).   Case discussed with pharmacist; plan to initiate TPN tomorrow.   Spoke with pt at bedside, who was pleasant and in good spirits at time of visit. Sister and son also assisted with providing history. Pt shares that she was in her usual state of health until 3 days ago, when she was unable to keep foods and liquids down. Prior to acute illness, pt denies any changes in appetite or diet. Typical intake is 2 meals per day (Breakfast: eggs, bacon, and toast; Dinner: eat, starch, and vegetable). Pt shares she rarely eats lunch, as she enjoys being outdoors in her garden most of the day and  becomes too preoccupied to eat.    Pt denies any weight loss. Reviewed wt hx; wt has been stable over the past 5 months.   Discussed rationale for NPO status. Pt aware of orders for TPN and explained to pt and family how pt will receive nutrition. Reviewed parameters for removing NGT and potential for diet advancement and pt is understanding that time frame for this is individualized based upon her response. Pt with no further questions, however, expressed appreciation for RD visit.   Medications reviewed and include 0.9% NaCl with KCl 20 mEq/L infusion @ 75 ml/hr.   Lab Results  Component Value Date   HGBA1C 5.7 (H) 06/17/2021   PTA DM medications are 1000 mg metformin BID and 18 units liraglutide daily.   Labs reviewed: K: 3.1, Mg: 1.6, CBGS: 101-141 (inpatient orders for glycemic control are 0-5 units insulin aspart daily at bedtime and 0-9 units insulin aspart every 4 hours).    NUTRITION - FOCUSED PHYSICAL EXAM:  Flowsheet Row Most Recent Value  Orbital Region No depletion  Upper Arm Region No depletion  Thoracic and Lumbar Region No depletion  Buccal Region No depletion  Temple Region No depletion  Clavicle Bone Region No depletion  Clavicle and Acromion Bone Region No depletion  Scapular Bone Region No depletion  Dorsal Hand No depletion  Patellar Region No depletion  Anterior Thigh Region No depletion  Posterior Calf Region No depletion  Edema (RD Assessment) None  Hair Reviewed  Eyes Reviewed  Mouth Reviewed  Skin Reviewed  Nails Reviewed       Diet  Order:   Diet Order             Diet NPO time specified Except for: Sips with Meds  Diet effective now                   EDUCATION NEEDS:   Education needs have been addressed  Skin:  Skin Assessment: Reviewed RN Assessment  Last BM:  11/19/21 (type 7)  Height:   Ht Readings from Last 1 Encounters:  11/19/21 '5\' 5"'$  (1.651 m)    Weight:   Wt Readings from Last 1 Encounters:  11/19/21 72.3 kg     Ideal Body Weight:  56.8 kg  BMI:  Body mass index is 26.52 kg/m.  Estimated Nutritional Needs:   Kcal:  1800-2000  Protein:  100-115 grams  Fluid:  > 1.8 L    Loistine Chance, RD, LDN, [place] Registered Dietitian II Certified Diabetes Care and Education Specialist Please refer to [facility] for RD and/or RD on-call/weekend/after hours pager

## 2021-11-19 NOTE — Care Management Important Message (Signed)
Important Message  Patient Details  Name: Katelyn Simmons MRN: [id number] Date of Birth: 29-Oct-1945   Medicare Important Message Given:  N/A - LOS <3 / Initial given by admissions     Dannette Barbara 11/19/2021, 6:34 PM

## 2021-11-19 NOTE — Assessment & Plan Note (Addendum)
Last hemoglobin A1c 5.7.

## 2021-11-19 NOTE — Assessment & Plan Note (Addendum)
Blood pressure stable off medications.

## 2021-11-20 ENCOUNTER — Other Ambulatory Visit: Payer: Self-pay

## 2021-11-20 ENCOUNTER — Inpatient Hospital Stay: Payer: Medicare HMO | Admitting: Anesthesiology

## 2021-11-20 ENCOUNTER — Encounter: Payer: Self-pay | Admitting: Family Medicine

## 2021-11-20 ENCOUNTER — Encounter
Admission: EM | Disposition: A | Discharge status: Home Health | Payer: Self-pay | Source: Home / Self Care | Attending: Internal Medicine

## 2021-11-20 DIAGNOSIS — E876 Hypokalemia: Secondary | ICD-10-CM | POA: Diagnosis not present

## 2021-11-20 DIAGNOSIS — K56609 Unspecified intestinal obstruction, unspecified as to partial versus complete obstruction: Secondary | ICD-10-CM | POA: Diagnosis not present

## 2021-11-20 HISTORY — PX: COLECTOMY WITH COLOSTOMY CREATION/HARTMANN PROCEDURE: SHX6598

## 2021-11-20 LAB — COMPREHENSIVE METABOLIC PANEL
ALT: 30 U/L (ref 0–44)
AST: 30 U/L (ref 15–41)
Albumin: 2.9 g/dL — ABNORMAL LOW (ref 3.5–5.0)
Alkaline Phosphatase: 91 U/L (ref 38–126)
Anion gap: 12 (ref 5–15)
BUN: 14 mg/dL (ref 8–23)
CO2: 25 mmol/L (ref 22–32)
Calcium: 8.8 mg/dL — ABNORMAL LOW (ref 8.9–10.3)
Chloride: 99 mmol/L (ref 98–111)
Creatinine, Ser: 0.9 mg/dL (ref 0.44–1.00)
GFR, Estimated: >60 mL/min (ref >60)
Glucose, Bld: 94 mg/dL (ref 70–99)
Potassium: 3.1 mmol/L — ABNORMAL LOW (ref 3.5–5.1)
Sodium: 136 mmol/L (ref 135–145)
Total Bilirubin: 1 mg/dL (ref 0.3–1.2)
Total Protein: 6.2 g/dL — ABNORMAL LOW (ref 6.5–8.1)

## 2021-11-20 LAB — GLUCOSE, CAPILLARY
Glucose-Capillary: 89 mg/dL (ref 70–99)
Glucose-Capillary: 84 mg/dL (ref 70–99)
Glucose-Capillary: 84 mg/dL (ref 70–99)
Glucose-Capillary: 133 mg/dL — ABNORMAL HIGH (ref 70–99)
Glucose-Capillary: 127 mg/dL — ABNORMAL HIGH (ref 70–99)

## 2021-11-20 LAB — PHOSPHORUS: Phosphorus: 3 mg/dL (ref 2.5–4.6)

## 2021-11-20 LAB — CBC
HCT: 33.5 % — ABNORMAL LOW (ref 36.0–46.0)
Hemoglobin: 10.7 g/dL — ABNORMAL LOW (ref 12.0–15.0)
MCH: 30.3 pg (ref 26.0–34.0)
MCHC: 31.9 g/dL (ref 30.0–36.0)
MCV: 94.9 fL (ref 80.0–100.0)
Platelets: 362 10*3/uL (ref 150–400)
RBC: 3.53 MIL/uL — ABNORMAL LOW (ref 3.87–5.11)
RDW: 15.2 % (ref 11.5–15.5)
WBC: 7.3 10*3/uL (ref 4.0–10.5)
nRBC: 0 % (ref 0.0–0.2)

## 2021-11-20 LAB — MAGNESIUM: Magnesium: 2.2 mg/dL (ref 1.7–2.4)

## 2021-11-20 LAB — TYPE AND SCREEN
ABO/RH(D): A POS
Antibody Screen: NEGATIVE

## 2021-11-20 LAB — TRIGLYCERIDES: Triglycerides: 154 mg/dL — ABNORMAL HIGH (ref <150)

## 2021-11-20 SURGERY — COLECTOMY, WITH COLOSTOMY CREATION
Anesthesia: General | Site: Abdomen

## 2021-11-20 MED ORDER — DEXAMETHASONE SODIUM PHOSPHATE 10 MG/ML IJ SOLN
INTRAMUSCULAR | Status: AC
  Filled 2021-11-20: qty 1

## 2021-11-20 MED ORDER — FENTANYL CITRATE (PF) 100 MCG/2ML IJ SOLN
INTRAMUSCULAR | Status: DC | PRN
  Administered 2021-11-20 (×2): 50 ug via INTRAVENOUS

## 2021-11-20 MED ORDER — FENTANYL CITRATE (PF) 100 MCG/2ML IJ SOLN
INTRAMUSCULAR | Status: AC
  Filled 2021-11-20: qty 2

## 2021-11-20 MED ORDER — ROCURONIUM BROMIDE 10 MG/ML (PF) SYRINGE
PREFILLED_SYRINGE | INTRAVENOUS | Status: AC
  Filled 2021-11-20: qty 10

## 2021-11-20 MED ORDER — LACTATED RINGERS IV SOLN: INTRAVENOUS | Status: DC

## 2021-11-20 MED ORDER — HYDROMORPHONE HCL 1 MG/ML IJ SOLN
INTRAMUSCULAR | Status: DC | PRN
  Administered 2021-11-20 (×2): .5 mg via INTRAVENOUS

## 2021-11-20 MED ORDER — PROPOFOL 10 MG/ML IV BOLUS
INTRAVENOUS | Status: DC | PRN
  Administered 2021-11-20: 150 mg via INTRAVENOUS
  Administered 2021-11-20: 50 mg via INTRAVENOUS

## 2021-11-20 MED ORDER — BUPIVACAINE LIPOSOME 1.3 % IJ SUSP
INTRAMUSCULAR | Status: AC
  Filled 2021-11-20: qty 20

## 2021-11-20 MED ORDER — FENTANYL CITRATE (PF) 100 MCG/2ML IJ SOLN
INTRAMUSCULAR | Status: AC
  Administered 2021-11-20: 25 ug via INTRAVENOUS
  Filled 2021-11-20: qty 2

## 2021-11-20 MED ORDER — ACETAMINOPHEN 10 MG/ML IV SOLN
INTRAVENOUS | Status: DC | PRN
  Administered 2021-11-20: 1000 mg via INTRAVENOUS

## 2021-11-20 MED ORDER — DEXMEDETOMIDINE HCL IN NACL 200 MCG/50ML IV SOLN
INTRAVENOUS | Status: DC | PRN
  Administered 2021-11-20: 12 ug via INTRAVENOUS

## 2021-11-20 MED ORDER — SODIUM CHLORIDE FLUSH 0.9 % IV SOLN
INTRAVENOUS | Status: AC
  Filled 2021-11-20: qty 30

## 2021-11-20 MED ORDER — ROCURONIUM BROMIDE 100 MG/10ML IV SOLN
INTRAVENOUS | Status: DC | PRN
  Administered 2021-11-20 (×2): 20 mg via INTRAVENOUS

## 2021-11-20 MED ORDER — LACTATED RINGERS IV SOLN: INTRAVENOUS | Status: DC | PRN

## 2021-11-20 MED ORDER — ONDANSETRON HCL 4 MG/2ML IJ SOLN
INTRAMUSCULAR | Status: DC | PRN
  Administered 2021-11-20: 4 mg via INTRAVENOUS

## 2021-11-20 MED ORDER — DEXAMETHASONE SODIUM PHOSPHATE 10 MG/ML IJ SOLN
INTRAMUSCULAR | Status: DC | PRN
  Administered 2021-11-20: 5 mg via INTRAVENOUS

## 2021-11-20 MED ORDER — ALVIMOPAN 12 MG PO CAPS
12.0000 mg | ORAL_CAPSULE | ORAL | Status: AC
  Filled 2021-11-20: qty 1

## 2021-11-20 MED ORDER — PROPOFOL 10 MG/ML IV BOLUS
INTRAVENOUS | Status: AC
  Filled 2021-11-20: qty 20

## 2021-11-20 MED ORDER — POTASSIUM CHLORIDE 10 MEQ/100ML IV SOLN
10.0000 meq | INTRAVENOUS | Status: AC
  Administered 2021-11-20 (×4): 10 meq via INTRAVENOUS
  Filled 2021-11-20: qty 100

## 2021-11-20 MED ORDER — OXYCODONE HCL 5 MG PO TABS: 5.0000 mg | ORAL_TABLET | Freq: Once | ORAL | Status: DC | PRN

## 2021-11-20 MED ORDER — 0.9 % SODIUM CHLORIDE (POUR BTL) OPTIME
TOPICAL | Status: DC | PRN
  Administered 2021-11-20: 1500 mL

## 2021-11-20 MED ORDER — FENTANYL CITRATE (PF) 100 MCG/2ML IJ SOLN
25.0000 ug | INTRAMUSCULAR | Status: DC | PRN
  Administered 2021-11-20: 25 ug via INTRAVENOUS

## 2021-11-20 MED ORDER — BUPIVACAINE-EPINEPHRINE (PF) 0.25% -1:200000 IJ SOLN
INTRAMUSCULAR | Status: AC
  Filled 2021-11-20: qty 30

## 2021-11-20 MED ORDER — ONDANSETRON HCL 4 MG/2ML IJ SOLN: 4.0000 mg | Freq: Once | INTRAMUSCULAR | Status: DC | PRN

## 2021-11-20 MED ORDER — LIDOCAINE HCL (CARDIAC) PF 100 MG/5ML IV SOSY
PREFILLED_SYRINGE | INTRAVENOUS | Status: DC | PRN
  Administered 2021-11-20: 80 mg via INTRAVENOUS

## 2021-11-20 MED ORDER — SODIUM CHLORIDE 0.9 % IV SOLN
2.0000 g | Freq: Three times a day (TID) | INTRAVENOUS | Status: DC
  Administered 2021-11-20 – 2021-11-22 (×6): 2 g via INTRAVENOUS
  Filled 2021-11-20 (×7): qty 2

## 2021-11-20 MED ORDER — SUCCINYLCHOLINE CHLORIDE 200 MG/10ML IV SOSY
PREFILLED_SYRINGE | INTRAVENOUS | Status: DC | PRN
  Administered 2021-11-20: 140 mg via INTRAVENOUS

## 2021-11-20 MED ORDER — ACETAMINOPHEN 10 MG/ML IV SOLN
INTRAVENOUS | Status: AC
  Filled 2021-11-20: qty 100

## 2021-11-20 MED ORDER — HYDROMORPHONE HCL 1 MG/ML IJ SOLN
0.5000 mg | INTRAMUSCULAR | Status: DC | PRN
  Administered 2021-11-20 – 2021-11-23 (×4): 0.5 mg via INTRAVENOUS
  Filled 2021-11-20 (×4): qty 0.5

## 2021-11-20 MED ORDER — SODIUM CHLORIDE (PF) 0.9 % IJ SOLN
INTRAMUSCULAR | Status: DC | PRN
  Administered 2021-11-20: 60 mL via SURGICAL_CAVITY

## 2021-11-20 MED ORDER — OXYCODONE HCL 5 MG/5ML PO SOLN: 5.0000 mg | Freq: Once | ORAL | Status: DC | PRN

## 2021-11-20 MED ORDER — LIDOCAINE HCL (PF) 2 % IJ SOLN
INTRAMUSCULAR | Status: AC
  Filled 2021-11-20: qty 5

## 2021-11-20 MED ORDER — HYDROMORPHONE HCL 1 MG/ML IJ SOLN
INTRAMUSCULAR | Status: AC
  Filled 2021-11-20: qty 1

## 2021-11-20 MED ORDER — POTASSIUM CHLORIDE IN NACL 20-0.9 MEQ/L-% IV SOLN
INTRAVENOUS | Status: DC
  Filled 2021-11-20 (×2): qty 1000

## 2021-11-20 MED ORDER — ACETAMINOPHEN 10 MG/ML IV SOLN: 1000.0000 mg | Freq: Once | INTRAVENOUS | Status: DC | PRN

## 2021-11-20 MED ORDER — ONDANSETRON HCL 4 MG/2ML IJ SOLN
INTRAMUSCULAR | Status: AC
  Filled 2021-11-20: qty 2

## 2021-11-20 MED ORDER — SODIUM CHLORIDE 0.9 % IV SOLN
INTRAVENOUS | Status: AC
  Filled 2021-11-20: qty 2

## 2021-11-20 MED ORDER — TRAVASOL 10 % IV SOLN
INTRAVENOUS | Status: AC
  Filled 2021-11-20: qty 556.8

## 2021-11-20 SURGICAL SUPPLY — 62 items
BASIN GRAD PLASTIC 32OZ STRL (MISCELLANEOUS) IMPLANT
BULB RESERV EVAC DRAIN JP 100C (MISCELLANEOUS) IMPLANT
CHLORAPREP W/TINT 26 (MISCELLANEOUS) ×1 IMPLANT
DRAIN CHANNEL JP 19F (MISCELLANEOUS) IMPLANT
DRAIN PENROSE 12X.25 LTX STRL (MISCELLANEOUS) IMPLANT
DRAPE LAPAROTOMY 100X77 ABD (DRAPES) ×1 IMPLANT
DRAPE LEGGINS SURG 28X43 STRL (DRAPES) IMPLANT
DRAPE UNDER BUTTOCK W/FLU (DRAPES) ×1 IMPLANT
DRSG OPSITE POSTOP 4X12 (GAUZE/BANDAGES/DRESSINGS) IMPLANT
DRSG TEGADERM 2-3/8X2-3/4 SM (GAUZE/BANDAGES/DRESSINGS) IMPLANT
DRSG TEGADERM 4X4.75 (GAUZE/BANDAGES/DRESSINGS) IMPLANT
ELECT BLADE 6.5 EXT (BLADE) IMPLANT
ELECT CAUTERY BLADE TIP 2.5 (TIP) ×1
ELECT REM PT RETURN 9FT ADLT (ELECTROSURGICAL) ×1
ELECTRODE CAUTERY BLDE TIP 2.5 (TIP) ×1 IMPLANT
ELECTRODE REM PT RTRN 9FT ADLT (ELECTROSURGICAL) ×1 IMPLANT
GAUZE 4X4 16PLY ~~LOC~~+RFID DBL (SPONGE) ×1 IMPLANT
GAUZE SPONGE 4X4 12PLY STRL (GAUZE/BANDAGES/DRESSINGS) ×1 IMPLANT
GLOVE SURG SYN 7.0 (GLOVE) ×4 IMPLANT
GLOVE SURG SYN 7.0 PF PI (GLOVE) ×2 IMPLANT
GLOVE SURG SYN 7.5  E (GLOVE) ×4
GLOVE SURG SYN 7.5 E (GLOVE) ×4 IMPLANT
GLOVE SURG SYN 7.5 PF PI (GLOVE) ×2 IMPLANT
GOWN STRL REUS W/ TWL LRG LVL3 (GOWN DISPOSABLE) ×4 IMPLANT
GOWN STRL REUS W/TWL LRG LVL3 (GOWN DISPOSABLE) ×4
HANDLE SUCTION POOLE (INSTRUMENTS) IMPLANT
KIT OSTOMY DRAINABLE 2.75 STR (WOUND CARE) IMPLANT
KIT TURNOVER KIT A (KITS) ×1 IMPLANT
LABEL OR SOLS (LABEL) ×1 IMPLANT
LIGASURE IMPACT 36 18CM CVD LR (INSTRUMENTS) IMPLANT
MANIFOLD NEPTUNE II (INSTRUMENTS) ×1 IMPLANT
NEEDLE HYPO 22GX1.5 SAFETY (NEEDLE) ×1 IMPLANT
NS IRRIG 1000ML POUR BTL (IV SOLUTION) ×1 IMPLANT
PACK BASIN MAJOR ARMC (MISCELLANEOUS) ×1 IMPLANT
PACK COLON CLEAN CLOSURE (MISCELLANEOUS) ×1 IMPLANT
SEPRAFILM MEMBRANE 5X6 (MISCELLANEOUS) IMPLANT
SPONGE T-LAP 18X18 ~~LOC~~+RFID (SPONGE) ×4 IMPLANT
STAPLER CIRCULAR MANUAL XL 29 (STAPLE) IMPLANT
STAPLER CVD CUT BL 40 RELOAD (ENDOMECHANICALS) IMPLANT
STAPLER CVD CUT BLU 40 RELOAD (ENDOMECHANICALS) IMPLANT
STAPLER PROXIMATE 75MM BLUE (STAPLE) IMPLANT
STAPLER RELOADABLE 65 2-0 SUT (MISCELLANEOUS) IMPLANT
STAPLER SKIN PROX 35W (STAPLE) ×1 IMPLANT
STAPLER SYS INTERNAL RELOAD SS (MISCELLANEOUS) IMPLANT
SUCTION POOLE HANDLE (INSTRUMENTS) ×1
SUT ETHILON 3-0 FS-10 30 BLK (SUTURE) ×1
SUT MNCRL 3-0 UNDYED SH (SUTURE) ×1 IMPLANT
SUT MONOCRYL 3-0 UNDYED (SUTURE)
SUT PDS AB 1 CT1 27 (SUTURE) ×2 IMPLANT
SUT PROLENE 2 0 SH DA (SUTURE) IMPLANT
SUT SILK 2-0 (SUTURE) ×1 IMPLANT
SUT SILK 3-0 (SUTURE) ×1 IMPLANT
SUT VIC AB 1 CTX 27 (SUTURE) ×1 IMPLANT
SUT VIC AB 3-0 SH 27 (SUTURE) ×1
SUT VIC AB 3-0 SH 27X BRD (SUTURE) IMPLANT
SUT VIC AB 3-0 SH 8-18 (SUTURE) IMPLANT
SUTURE EHLN 3-0 FS-10 30 BLK (SUTURE) IMPLANT
SYR 10ML LL (SYRINGE) ×1 IMPLANT
SYR 20ML LL LF (SYRINGE) IMPLANT
TRAP FLUID SMOKE EVACUATOR (MISCELLANEOUS) ×1 IMPLANT
TRAY FOLEY MTR SLVR 16FR STAT (SET/KITS/TRAYS/PACK) ×1 IMPLANT
WATER STERILE IRR 500ML POUR (IV SOLUTION) ×1 IMPLANT

## 2021-11-20 NOTE — Op Note (Signed)
Procedure Date:  11/20/2021  Pre-operative Diagnosis:  Sigmoid colon stricture resulting in large bowel obstruction  Post-operative Diagnosis: Sigmoid colon stricture resulting in large bowel obstruction  Procedure:  Exploratory Laparotomy, Hartmann's procedure  Surgeon:  Melvyn Neth, MD  Assistant:  Caroleen Hamman, MD  Anesthesia:  General endotracheal  Estimated Blood Loss:  100 ml  Specimens:  Sigmoid colon  Complications:  None  Indications for Procedure:  This is a 76 y.o. female who presents with abdominal pain and distention and findings of a large bowel obstruction due to sigmoid colon stricture.  The risks of bleeding, abscess or infection, injury to surrounding structures, and need for further procedures were all discussed with the patient and was willing to proceed.  Description of Procedure: The patient was correctly identified in the preoperative area and brought into the operating room.  The patient was placed supine with VTE prophylaxis in place.  Appropriate time-outs were performed.  Anesthesia was induced and the patient was intubated.  Foley catheter was placed.  Appropriate antibiotics were infused.  The abdomen was prepped and draped in a sterile fashion.  A midline incision was made and electrocautery was used to dissect down the subcutaneous tissue to the fascia.  The fascia was incised and extended superiorly and inferiorly.  Upon entry to the abdomen, we found the small bowel mildly distended as seen on CT scan and the large bowel distended as well all the way to the cecum.  There were no serosal tears and no evidence of perforation.  Looking into the pelvis, there was an area of narrowing with significant scarring in the sigmoid colon distally, consistent with episodes of prior diverticulitis.  The mesentery was thickened as well.  The sigmoid colon was somewhat redundant.  The area of scarring had some adhesions to the lateral pelvic wall which were taken down  bluntly and with LigaSure.  We were able to determine the proximal and distal sites for our resection.  We created windows in the mesentery proximally and distally and used the contour green load stapler to transect the bowel proximal and distal to the stricture.  After that, the specimen mesentery was taken down using LigaSure.  Bleeding was controlled with cautery.  The rectal stump was marked with 2-0 Prolene suture x2 at each corner.  Cautery and LigaSure was used to mobilize the colon proximally to allow better reach for end colostomy.  Once this was done, the abdomen was thoroughly irrigated.  We milked the contents of the small bowel proximally to the NG tube which was confirmed in good position in the stomach.  This gave [place] some more room for closure of the abdomen.  I also decompressed the very distal portion of the colon by creating a colotomy at the staple line and using the pool suction tip to decompress the distal portion of the colon in order to facilitate the passing of that through the abdominal wall for colostomy.  The colotomy was then closed with a pursestring 2-0 Prolene suture.  A 19 [place] Blake drain was placed coming from the right lower quadrant going towards the pelvis and towards the left gutter.  Then we created an incision at the skin at the previously marked colostomy site a cylinder of subcutaneous fat was resected going down to the anterior rectus sheath.  The anterior sheath was cut in a cruciate fashion and the rectus muscle was split parallel to the fibers and the posterior sheath was also cut in cruciate fashion.  2 fingers were initially able to pass through without too much tightness.  The distal portion of the colon staple line were passed through without complications.  At this time, we scrubbed down to start are clean closure.  New drapes were placed and new gown and gloves were placed.  80 mL of Exparel solution combined with quarter percent bupivacaine with epi and saline  were infiltrated onto the peritoneum, fascia, and subcutaneous tissue.  The fascia was then closed using #1 PDS sutures.  The midline wound was then closed in layers with 3-0 Vicryl and skin staples over 1/4 inch Penrose drain.  The drain was secured using 3-0 nylon suture.  We then matured our end colostomy in standard [place] fashion using multiple 3-0 Vicryl sutures.  The skin was then cleansed.  The wound was dressed with Honeycomb dressing and the drain with 4x4 gauze and tegaderm.  Ostomy appliance was placed over the stoma which was working with stool output already.  Please note that Dr. Dahlia Byes was present and scrubbed in for all critical portions of the procedure.  His assistance was required due to the complexity of the case, patient comorbidities, body habitus, appropriate exposure, resection, and closure of the abdomen.  The patient was emerged from anesthesia and extubated and brought to the recovery room for further management.  The patient tolerated the procedure well and all counts were correct at the end of the case.   Melvyn Neth, MD

## 2021-11-20 NOTE — Progress Notes (Signed)
  Progress Note   Patient: Katelyn Simmons OZY:[id number] DOB: Sep 16, 1945 DOA: 11/18/2021     2 DOS: the patient was seen and examined on 11/20/2021   Brief hospital course: Katelyn Simmons is a 76 y.o. Caucasian female with medical history significant for anxiety, osteoarthritis, stage III chronic kidney disease, rheumatoid arthritis, type 2 diabetes mellitus, depression, hypertension, dyslipidemia, GERD, hypothyroidism and CVA, who presented to the emergency room with acute onset of abdominal pain all over with associated abdominal distention that started about 3 weeks ago.  Abdominal pain worsened for the last 3 days, she also started having nausea vomiting.  However, she still had a several loose stools yesterday. CT scan showed colon obstruction at the sigmoid level.  Patient was placed on IV fluids, n.p.o. and NG suction.  General surgery consult obtained.  Patient going to surgery 10/20.  Assessment and Plan: * Large bowel obstruction [facility]) Patient is going to surgery today.  We will see patient again.   Hypomagnesemia Hypokalemia. Magnesium normalized.  Potassium still 3.1 after giving 20 mEq IV potassium yesterday, will give 40 mEq of potassium today.     Hypothyroidism On Synthroid.   Controlled type 2 diabetes mellitus without complication, without long-term current use of insulin (HCC) Continue sliding scale insulin.   Anxiety and depression Continue antidepressants.   Essential hypertension Continue metoprolol.        Subjective:  Patient doing well, no significant abdominal pain.  On NG suction without nausea vomiting.  Physical Exam: Vitals:   11/19/21 1531 11/19/21 2031 11/20/21 0341 11/20/21 0758  BP: (!) 155/66 (!) 153/63 (!) 136/48 (!) 160/56  Pulse: 80 84 91 98  Resp: '20 20 20 14  '$ Temp: (!) 97.4 F (36.3 C) 98.5 F (36.9 C) 98.2 F (36.8 C) (!) 97.3 F (36.3 C)  TempSrc: Oral Oral Oral Oral  SpO2: 100% 97% 94% 97%  Weight:      Height:        General exam: Appears calm and comfortable  Respiratory system: Clear to auscultation. Respiratory effort normal. Cardiovascular system: S1 & S2 heard, RRR. No JVD, murmurs, rubs, gallops or clicks. No pedal edema. Gastrointestinal system: Abdomen is distended, soft and nontender. No organomegaly or masses felt. Normal bowel sounds heard. Central nervous system: Alert and oriented. No focal neurological deficits. Extremities: Symmetric 5 x 5 power. Skin: No rashes, lesions or ulcers Psychiatry: Judgement and insight appear normal. Mood & affect appropriate.   Data Reviewed:  Lab results reviewed.  Family Communication: Daughter at the bedside.  Disposition: Status is: Inpatient Remains inpatient appropriate because: Severity of disease, inpatient procedure.  Planned Discharge Destination:  TBD    Time spent: 29 minutes  Author: Sharen Hones, MD 11/20/2021 11:20 AM  For on call review [email or web address].

## 2021-11-20 NOTE — Consult Note (Signed)
[place] Nurse requested for preoperative stoma site marking  Discussed surgical procedure and stoma creation with patient.  Explained role of the [place] nurse team.  Provided the patient with educational booklet and provided samples of pouching options.  Patient had no questions.  Examined patient lying order to place the marking in the patient's visual field, away from any creases or abdominal contour issues and within the rectus muscle.  Attempted to mark below the patient's belt line.   Marked for colostomy in the LMQ  __6__ cm to the left of the umbilicus and even with the umbilicus.  Patient's abdomen cleansed with CHG wipes at site markings, allowed to air dry prior to marking.Covered mark with thin film transparent dressing to preserve mark until date of surgery.   [place] Nurse team will follow up with patient after surgery for continue ostomy care and teaching.  Val Riles, RN, MSN, CWOCN, CNS-BC, pager [phone]

## 2021-11-20 NOTE — Transfer of Care (Signed)
Immediate Anesthesia Transfer of Care Note  Patient: Katelyn Simmons  Procedure(s) Performed: COLECTOMY WITH COLOSTOMY CREATION/HARTMANN PROCEDURE (Abdomen)  Patient Location: PACU  Anesthesia Type:General  Level of Consciousness: awake, drowsy and patient cooperative  Airway & Oxygen Therapy: Patient Spontanous Breathing and Patient connected to face mask oxygen  Post-op Assessment: Report given to RN and Post -op Vital signs reviewed and stable  Post vital signs: Reviewed and stable  Last Vitals:  Vitals Value Taken Time  BP    Temp    Pulse 109 11/20/21 1719  Resp 14 11/20/21 1719  SpO2 100 % 11/20/21 1719  Vitals shown include unvalidated device data.  Last Pain:  Vitals:   11/20/21 1217  TempSrc: Temporal  PainSc: 1          Complications: No notable events documented.

## 2021-11-20 NOTE — Anesthesia Procedure Notes (Signed)
Procedure Name: Intubation Date/Time: 11/20/2021 1:13 PM  Performed by: Natasha Mead, CRNAPre-anesthesia Checklist: Patient identified, Emergency Drugs available, Suction available and Patient being monitored Patient Re-evaluated:Patient Re-evaluated prior to induction Oxygen Delivery Method: Circle system utilized Preoxygenation: Pre-oxygenation with 100% oxygen Induction Type: IV induction Ventilation: Mask ventilation without difficulty Laryngoscope Size: McGraph and 3 Grade View: Grade I Tube type: Oral Number of attempts: 1 Airway Equipment and Method: Stylet Placement Confirmation: ETT inserted through vocal cords under direct vision, positive ETCO2 and breath sounds checked- equal and bilateral Secured at: 20 cm Tube secured with: Tape Dental Injury: Teeth and Oropharynx as per pre-operative assessment

## 2021-11-20 NOTE — Progress Notes (Signed)
PHARMACY - TOTAL PARENTERAL NUTRITION CONSULT NOTE   Indication: Small bowel obstruction  Patient Measurements: Height: '5\' 5"'$  (165.1 cm) Weight: 72.3 kg (159 lb 6.3 oz) IBW/kg (Calculated) : 57   Body mass index is 26.52 kg/m.  Assessment: 67 YOF w/ PMH of anxiety, osteoarthritis, stage III chronic kidney disease, rheumatoid arthritis, type 2 diabetes mellitus, depression, hypertension, dyslipidemia, GERD, hypothyroidism and CVA, who presented with acute onset of abdominal pain all over with associated abdominal distention that started about 3 weeks ago.  Glucose / Insulin: sSSI q4h  BG 94 - 133 previous 24h  no SSI required Electrolytes: hypokalemia Renal: Scr<1, stable Hepatic: LFTs wnl --TG 154 at baseline Intake / Output;  MIVF: 0.9 % NaCl with KCl 20 mEq/ L  75 mL/hr GI Imaging: GI Surgeries / Procedures:   Central access: 11/20/21 (pending placement) TPN start date: 11/20/21  Nutritional Goals: Goal TPN rate is 80 mL/hr (provides 111 g of protein and 1985.5 kcals per day)  RD Assessment: Estimated Needs Total Energy Estimated Needs: 1800-2000 Total Protein Estimated Needs: 100-115 grams Total Fluid Estimated Needs: > 1.8 L  Current Nutrition:  NPO  Plan:  Start TPN at 71m/hr at 1800 Nutritional components Amino acids (using Travasol 10%): 55.7 grams Dextrose: 128 grams Lispids (using 20% SMOFlipids): 30 grams kCal 992.7 Electrolytes in TPN (standard): Na 560m/L, K 5075mL, Ca 5mE80m, Mg 5mEq73m and Phos 15mmo103m Cl:Ac 1:1 Add standard MVI and trace elements to TPN 10 mEq IV KCl x 4 continue Sensitive q4h SSI and adjust as needed  Reduce MIVF to 35 mL/hr at 1800 Monitor TPN labs on Mon/Thurs, daily until stable  RodneyDallie Piles/2023,8:58 AM

## 2021-11-20 NOTE — Anesthesia Postprocedure Evaluation (Signed)
Anesthesia Post Note  Patient: Katelyn Simmons  Procedure(s) Performed: COLECTOMY WITH COLOSTOMY CREATION/HARTMANN PROCEDURE (Abdomen)  Patient location during evaluation: PACU Anesthesia Type: General Level of consciousness: awake and alert, oriented and patient cooperative Pain management: pain level controlled Vital Signs Assessment: post-procedure vital signs reviewed and stable Respiratory status: spontaneous breathing, nonlabored ventilation and respiratory function stable Cardiovascular status: blood pressure returned to baseline and stable Postop Assessment: adequate PO intake Anesthetic complications: no   No notable events documented.   Last Vitals:  Vitals:   11/20/21 1730 11/20/21 1745  BP:    Pulse: 99   Resp:  14  Temp:    SpO2:      Last Pain:  Vitals:   11/20/21 1745  TempSrc:   PainSc: [place]

## 2021-11-20 NOTE — Plan of Care (Signed)
  Problem: Education: Goal: Knowledge of General Education information will improve Description: Including pain rating scale, medication(s)/side effects and non-pharmacologic comfort measures Outcome: Progressing   Problem: Health Behavior/Discharge Planning: Goal: Ability to manage health-related needs will improve Outcome: Progressing   Problem: Clinical Measurements: Goal: Ability to maintain clinical measurements within normal limits will improve Outcome: Progressing Goal: Will remain free from infection Outcome: Progressing Goal: Diagnostic test results will improve Outcome: Progressing Goal: Respiratory complications will improve Outcome: Progressing Goal: Cardiovascular complication will be avoided Outcome: Progressing   Problem: Activity: Goal: Risk for activity intolerance will decrease Outcome: Progressing   Problem: Nutrition: Goal: Adequate nutrition will be maintained Outcome: Progressing   Problem: Nutrition: Goal: Adequate nutrition will be maintained Outcome: Progressing   Problem: Coping: Goal: Level of anxiety will decrease Outcome: Progressing   Problem: Coping: Goal: Level of anxiety will decrease Outcome: Progressing   Problem: Elimination: Goal: Will not experience complications related to bowel motility Outcome: Progressing Goal: Will not experience complications related to urinary retention Outcome: Progressing   Problem: Pain Managment: Goal: General experience of comfort will improve Outcome: Progressing   Problem: Safety: Goal: Ability to remain free from injury will improve Outcome: Progressing   Problem: Education: Goal: Ability to describe self-care measures that may prevent or decrease complications (Diabetes Survival Skills Education) will improve Outcome: Progressing Goal: Individualized Educational Video(s) Outcome: Progressing   Problem: Coping: Goal: Ability to adjust to condition or change in health will  improve Outcome: Progressing   Problem: Metabolic: Goal: Ability to maintain appropriate glucose levels will improve Outcome: Progressing   Problem: Skin Integrity: Goal: Risk for impaired skin integrity will decrease Outcome: Progressing

## 2021-11-20 NOTE — Progress Notes (Signed)
Spoke to [employer], stated pt is going to surgery now and she's not sure when the pt will return. Advised that I am here to do the PICC now however, the PICC will be placed tomorrow if the pt returns from surgery late this afternoon. Suggested she ask the MD to place a temporary IJ while in surgery.Catalina Pizza

## 2021-11-20 NOTE — Anesthesia Preprocedure Evaluation (Addendum)
Anesthesia Evaluation  Patient identified by MRN, date of birth, ID band Patient awake    Reviewed: Allergy & Precautions, NPO status , Patient's Chart, lab work & pertinent test results  History of Anesthesia Complications Negative for: history of anesthetic complications  Airway Mallampati: III  TM Distance: >3 FB Neck ROM: full    Dental  (+) Chipped   Pulmonary neg pulmonary ROS,    Pulmonary exam normal        Cardiovascular Exercise Tolerance: Poor hypertension, On Medications Normal cardiovascular exam+ dysrhythmias   TTE 06/2021 IMPRESSIONS    1. Left ventricular ejection fraction, by estimation, is 60 to 65%. The  left ventricle has normal function. The left ventricle has no regional  wall motion abnormalities. Left ventricular diastolic parameters are  consistent with Grade I diastolic  dysfunction (impaired relaxation).  2. Right ventricular systolic function is normal. The right ventricular  size is normal.  3. The mitral valve is normal in structure. No evidence of mitral valve  regurgitation. No evidence of mitral stenosis.  4. The aortic valve has an indeterminant number of cusps. Aortic valve  regurgitation is not visualized. No aortic stenosis is present.  5. The inferior vena cava is normal in size with greater than 50%  respiratory variability, suggesting right atrial pressure of 3 mmHg.  6. Agitated saline contrast bubble study was negative, with no evidence  of any interatrial shunt.    Neuro/Psych PSYCHIATRIC DISORDERS Anxiety Depression  Neuromuscular disease CVA (residual memory losss)    GI/Hepatic Neg liver ROS, GERD  ,  Endo/Other  diabetesHypothyroidism   Renal/GU CRFRenal disease     Musculoskeletal  (+) Arthritis , Osteoarthritis and Rheumatoid disorders,  Fibromyalgia -  Abdominal   Peds  Hematology negative hematology ROS (+)   Anesthesia Other Findings Past Medical  History: No date: Abnormal finding on EKG No date: Anemia     Comment:  pernicious anemia No date: Anxiety No date: Arthritis     Comment:  everywhere No date: B12 deficiency No date: Barrett's esophagus No date: CKD (chronic kidney disease)     Comment:  stage III ckd No date: Collagen vascular disease (HCC)     Comment:  RA No date: Depression No date: Diabetes mellitus without complication (HCC) No date: Dysphagia No date: Dyspnea     Comment:  with exertion No date: Dysrhythmia     Comment:  sinus arrythmia or just stops momentarily, per patient No date: Esophageal stricture No date: Fatty liver No date: GERD (gastroesophageal reflux disease) No date: Goiter, nontoxic, multinodular No date: History of kidney stones No date: Hx of repair of left rotator cuff No date: Hyperlipidemia No date: Hypertension No date: Hypothyroidism No date: Neuromuscular disorder (HCC)     Comment:  fibromyalgia No date: Obesity No date: Pain No date: Rotator cuff tear arthropathy of both shoulders 2008: Shingles No date: Stroke [facility])     Comment:  mini strokes about 20 years ago.  some memory loss               residual No date: Throat pain  Past Surgical History: No date: ABDOMINAL HYSTERECTOMY No date: CHOLECYSTECTOMY No date: endoscopic carpal tunnel release; Bilateral No date: ESOPHAGOGASTRODUODENOSCOPY 03/31/2016: ESOPHAGOGASTRODUODENOSCOPY (EGD) WITH PROPOFOL; N/A     Comment:  Procedure: ESOPHAGOGASTRODUODENOSCOPY (EGD) WITH               PROPOFOL;  Surgeon: Manya Silvas, MD;  Location: [facility]  ENDOSCOPY;  Service: Endoscopy;  Laterality: N/A; 10/25/2018: LUMBAR LAMINECTOMY/DECOMPRESSION MICRODISCECTOMY; N/A     Comment:  Procedure: LUMBAR LAMINECTOMY/DECOMPRESSION               MICRODISCECTOMY 2 LEVELS L3-5;  Surgeon: Meade Maw, MD;  Location: [facility] ORS;  Service: Neurosurgery;              Laterality: N/A; 10/2018: UPPER GI ENDOSCOPY      Comment:  light run  BMI    Body Mass Index: 26.52 kg/m      Reproductive/Obstetrics negative OB ROS                            Anesthesia Physical Anesthesia Plan  ASA: 3  Anesthesia Plan: General ETT   Post-op Pain Management: Dilaudid IV and Ofirmev IV (intra-op)*   Induction: Intravenous  PONV Risk Score and Plan: 4 or greater and Ondansetron, Dexamethasone and Treatment may vary due to age or medical condition  Airway Management Planned: Oral ETT  Additional Equipment:   Intra-op Plan:   Post-operative Plan: Extubation in OR  Informed Consent: I have reviewed the patients History and Physical, chart, labs and discussed the procedure including the risks, benefits and alternatives for the proposed anesthesia with the patient or authorized representative who has indicated his/her understanding and acceptance.     Dental Advisory Given  Plan Discussed with: Anesthesiologist, CRNA and Surgeon  Anesthesia Plan Comments: (Patient consented for risks of anesthesia including but not limited to:  - adverse reactions to medications - damage to eyes, teeth, lips or other oral mucosa - nerve damage due to positioning  - sore throat or hoarseness - Damage to heart, brain, nerves, lungs, other parts of body or loss of life  Patient voiced understanding.)       Anesthesia Quick Evaluation

## 2021-11-20 NOTE — Progress Notes (Signed)
11/20/2021  Subjective: No acute events.  Patient had some liquid stool and gas.  Denies any abdominal pain.  Was marked yesterday for end colostomy  Vital signs: Temp:  [97.3 F (36.3 C)-98.5 F (36.9 C)] 97.3 F (36.3 C) (10/20 0758) Pulse Rate:  [80-98] 98 (10/20 0758) Resp:  [14-20] 14 (10/20 0758) BP: (136-160)/(48-66) 160/56 (10/20 0758) SpO2:  [94 %-100 %] 97 % (10/20 0758)   Intake/Output: 10/19 0701 - 10/20 0700 In: 1829.9 [P.O.:30; I.V.:1426.6; NG/GT:180; IV Piggyback:193.4] Out: 500 [Emesis/NG output:500] Last BM Date : 11/19/21  Physical Exam: Constitutional: No acute distress Abdomen:  soft, somewhat distended, non-tender currently.    Labs:  Recent Labs    11/19/21 0556 11/20/21 0410  WBC 9.0 7.3  HGB 11.1* 10.7*  HCT 34.2* 33.5*  PLT 376 362   Recent Labs    11/19/21 0556 11/20/21 0410  NA 136 136  K 3.1* 3.1*  CL 101 99  CO2 26 25  GLUCOSE 133* 94  BUN 17 14  CREATININE 0.94 0.90  CALCIUM 9.0 8.8*   No results for input(s): "LABPROT", "INR" in the last 72 hours.  Imaging: [place] EKG SITE RITE  Result Date: 11/19/2021 If Site Rite image not attached, placement could not be confirmed due to current cardiac rhythm.   Assessment/Plan: This is a 76 y.o. female with large bowel obstruction due to diverticular stricture  --Discussed with patient plan for today for OR for exlap and sigmoidectomy with colostomy formation.  Anticipate needing end colostomy due to difference in colon caliber proximal and distal to stricture.  She's been marked by our [place] RN.  Appreciate her help.   --PICC line ordered and TPN will start tonight, in anticipation of some ileus from her surgery and her LBO. --Consent order placed, IV abx for surgery ordered, Entereg ordered, all questions answered.   I spent 35 minutes dedicated to the care of this patient on the date of this encounter to include pre-visit review of records, face-to-face time with the patient discussing  diagnosis and management, and any post-visit coordination of care.  Melvyn Neth, [facility]

## 2021-11-21 ENCOUNTER — Encounter: Payer: Self-pay | Admitting: Surgery

## 2021-11-21 DIAGNOSIS — K56609 Unspecified intestinal obstruction, unspecified as to partial versus complete obstruction: Secondary | ICD-10-CM | POA: Diagnosis not present

## 2021-11-21 DIAGNOSIS — I10 Essential (primary) hypertension: Secondary | ICD-10-CM | POA: Diagnosis not present

## 2021-11-21 LAB — CBC
HCT: 34 % — ABNORMAL LOW (ref 36.0–46.0)
Hemoglobin: 10.8 g/dL — ABNORMAL LOW (ref 12.0–15.0)
MCH: 30.4 pg (ref 26.0–34.0)
MCHC: 31.8 g/dL (ref 30.0–36.0)
MCV: 95.8 fL (ref 80.0–100.0)
Platelets: 418 10*3/uL — ABNORMAL HIGH (ref 150–400)
RBC: 3.55 MIL/uL — ABNORMAL LOW (ref 3.87–5.11)
RDW: 15.3 % (ref 11.5–15.5)
WBC: 9.5 10*3/uL (ref 4.0–10.5)
nRBC: 0 % (ref 0.0–0.2)

## 2021-11-21 LAB — GLUCOSE, CAPILLARY
Glucose-Capillary: 108 mg/dL — ABNORMAL HIGH (ref 70–99)
Glucose-Capillary: 109 mg/dL — ABNORMAL HIGH (ref 70–99)
Glucose-Capillary: 119 mg/dL — ABNORMAL HIGH (ref 70–99)
Glucose-Capillary: 120 mg/dL — ABNORMAL HIGH (ref 70–99)
Glucose-Capillary: 123 mg/dL — ABNORMAL HIGH (ref 70–99)
Glucose-Capillary: 147 mg/dL — ABNORMAL HIGH (ref 70–99)

## 2021-11-21 LAB — RENAL FUNCTION PANEL
Albumin: 2.6 g/dL — ABNORMAL LOW (ref 3.5–5.0)
Anion gap: 14 (ref 5–15)
BUN: 17 mg/dL (ref 8–23)
CO2: 21 mmol/L — ABNORMAL LOW (ref 22–32)
Calcium: 8.6 mg/dL — ABNORMAL LOW (ref 8.9–10.3)
Chloride: 104 mmol/L (ref 98–111)
Creatinine, Ser: 1.18 mg/dL — ABNORMAL HIGH (ref 0.44–1.00)
GFR, Estimated: 48 mL/min — ABNORMAL LOW (ref >60)
Glucose, Bld: 125 mg/dL — ABNORMAL HIGH (ref 70–99)
Phosphorus: 3.9 mg/dL (ref 2.5–4.6)
Potassium: 4.1 mmol/L (ref 3.5–5.1)
Sodium: 139 mmol/L (ref 135–145)

## 2021-11-21 LAB — MAGNESIUM: Magnesium: 1.9 mg/dL (ref 1.7–2.4)

## 2021-11-21 MED ORDER — ACETAMINOPHEN 10 MG/ML IV SOLN
1000.0000 mg | Freq: Four times a day (QID) | INTRAVENOUS | Status: AC
  Administered 2021-11-21 – 2021-11-22 (×4): 1000 mg via INTRAVENOUS
  Filled 2021-11-21 (×4): qty 100

## 2021-11-21 MED ORDER — TRAVASOL 10 % IV SOLN
INTRAVENOUS | Status: AC
  Filled 2021-11-21: qty 556.8

## 2021-11-21 MED ORDER — CHLORHEXIDINE GLUCONATE CLOTH 2 % EX PADS
6.0000 | MEDICATED_PAD | Freq: Every day | CUTANEOUS | Status: DC
  Administered 2021-11-21 – 2021-11-26 (×6): 6 via TOPICAL

## 2021-11-21 MED ORDER — PHENOL 1.4 % MT LIQD
1.0000 | OROMUCOSAL | Status: DC | PRN
  Administered 2021-11-21: 1 via OROMUCOSAL
  Filled 2021-11-21: qty 177

## 2021-11-21 MED ORDER — SODIUM CHLORIDE 0.9% FLUSH
10.0000 mL | Freq: Two times a day (BID) | INTRAVENOUS | Status: DC
  Administered 2021-11-21 – 2021-11-26 (×9): 10 mL

## 2021-11-21 MED ORDER — LACTATED RINGERS IV SOLN: INTRAVENOUS | Status: DC

## 2021-11-21 MED ORDER — SODIUM CHLORIDE 0.9% FLUSH: 10.0000 mL | INTRAVENOUS | Status: DC | PRN

## 2021-11-21 NOTE — Progress Notes (Signed)
   11/21/21 0827  Assess: MEWS Score  Temp 97.6 F (36.4 C)  Pulse Rate (!) 115  Resp 18  SpO2 95 %  O2 Device Room Air  Assess: MEWS Score  MEWS Temp 0  MEWS Systolic 0  MEWS Pulse 2  MEWS RR 0  MEWS LOC 0  MEWS Score 2  MEWS Score Color Yellow  Assess: if the MEWS score is Yellow or Red  Were vital signs taken at a resting state? Yes  Focused Assessment No change from prior assessment  Does the patient meet 2 or more of the SIRS criteria? Yes  Does the patient have a confirmed or suspected source of infection? Yes  Provider and Rapid Response Notified? No  MEWS guidelines implemented *See Row Information* Yes  Treat  MEWS Interventions Administered scheduled meds/treatments  Take Vital Signs  Increase Vital Sign Frequency  Yellow: Q 2hr X 2 then Q 4hr X 2, if remains yellow, continue Q 4hrs  Escalate  MEWS: Escalate Yellow: discuss with charge nurse/RN and consider discussing with provider and RRT  Notify: Charge Nurse/RN  Name of Charge Nurse/RN Notified [place], RN  Date Charge Nurse/RN Notified 11/21/21  Time Charge Nurse/RN Notified [phone]  Assess: SIRS CRITERIA  SIRS Temperature  0  SIRS Pulse 1  SIRS Respirations  0  SIRS WBC 1  SIRS Score Sum  2

## 2021-11-21 NOTE — Progress Notes (Signed)
Peripherally Inserted Central Catheter Placement  The IV Nurse has discussed with the patient and/or persons authorized to consent for the patient, the purpose of this procedure and the potential benefits and risks involved with this procedure.  The benefits include less needle sticks, lab draws from the catheter, and the patient may be discharged home with the catheter. Risks include, but not limited to, infection, bleeding, blood clot (thrombus formation), and puncture of an artery; nerve damage and irregular heartbeat and possibility to perform a PICC exchange if needed/ordered by physician.  Alternatives to this procedure were also discussed.  Bard Power PICC patient education guide, fact sheet on infection prevention and patient information card has been provided to patient /or left at bedside.    PICC Placement Documentation  PICC Double Lumen 25/00/37 Right Basilic 37 cm 0 cm (Active)  Indication for Insertion or Continuance of Line Administration of hyperosmolar/irritating solutions (i.e. TPN, Vancomycin, etc.) 11/21/21 1351  Exposed Catheter (cm) 0 cm 11/21/21 1351  Site Assessment Clean, Dry, Intact 11/21/21 1351  Lumen #1 Status Flushed;Saline locked;Blood return noted 11/21/21 1351  Lumen #2 Status Flushed;Saline locked;Blood return noted 11/21/21 1351  Dressing Type Transparent;Securing device 11/21/21 1351  Dressing Status Antimicrobial disc in place;Clean, Dry, Intact 11/21/21 1351  Safety Lock Not Applicable 04/88/89 1694  Line Care Connections checked and tightened 11/21/21 1351  Line Adjustment (NICU/IV Team Only) No 11/21/21 1351  Dressing Intervention New dressing 11/21/21 1351  Dressing Change Due 11/28/21 11/21/21 1351       Rolena Infante 11/21/2021, 1:52 PM

## 2021-11-21 NOTE — Progress Notes (Signed)
  Progress Note   Patient: Katelyn Simmons:[id number] DOB: 1945-04-03 DOA: 11/18/2021     3 DOS: the patient was seen and examined on 11/21/2021   Brief hospital course: Katelyn Simmons is a 76 y.o. Caucasian female with medical history significant for anxiety, osteoarthritis, stage III chronic kidney disease, rheumatoid arthritis, type 2 diabetes mellitus, depression, hypertension, dyslipidemia, GERD, hypothyroidism and CVA, who presented to the emergency room with acute onset of abdominal pain all over with associated abdominal distention that started about 3 weeks ago.  Abdominal pain worsened for the last 3 days, she also started having nausea vomiting.  However, she still had a several loose stools yesterday. CT scan showed colon obstruction at the sigmoid level.  Patient was placed on IV fluids, n.p.o. and NG suction.  General surgery consult obtained.  Colectomy and colostomy performed on 10/20.  Assessment and Plan: * Large bowel obstruction ([place]) Status post colectomy and colostomy. Patient did not seem to have any malignancy, but obstruction was caused by adhesion. Patient condition is improving postop, currently n.p.o., had a small amount stools in the ostomy bag. Continue IV fluids.  Surgery is starting TPN.  Hypomagnesemia Hypokalemia. Condition improved.  Recheck levels tomorrow.     Hypothyroidism On Synthroid.   Controlled type 2 diabetes mellitus without complication, without long-term current use of insulin (HCC) Continue sliding scale insulin.   Anxiety and depression Continue antidepressants.   Essential hypertension Continue metoprolol.      Subjective:  Patient doing well, no abdominal pain.  No nausea vomiting.  Physical Exam: Vitals:   11/21/21 0807 11/21/21 0827 11/21/21 1030 11/21/21 1102  BP: (!) 171/58  (!) 135/56 (!) 141/57  Pulse:  (!) 115 95 99  Resp:  '18 16 18  '$ Temp:  97.6 F (36.4 C) (!) 97.5 F (36.4 C) 98.7 F (37.1 C)  TempSrc:   Oral Oral Oral  SpO2:  95% 95% 100%  Weight:      Height:       General exam: Appears calm and comfortable  Respiratory system: Clear to auscultation. Respiratory effort normal. Cardiovascular system: S1 & S2 heard, RRR. No JVD, murmurs, rubs, gallops or clicks. No pedal edema. Gastrointestinal system: Abdomen is nondistended, soft and nontender. No organomegaly or masses felt.  Bowel sounds present. Central nervous system: Alert and oriented. No focal neurological deficits. Extremities: Symmetric 5 x 5 power. Skin: No rashes, lesions or ulcers Psychiatry: Judgement and insight appear normal. Mood & affect appropriate.   Data Reviewed:  Lab results reviewed.  Family Communication:   Disposition: Status is: Inpatient Remains inpatient appropriate because: Severity of disease, postop day #1.  Planned Discharge Destination: Home with Home Health    Time spent: 32 minutes  Author: Sharen Hones, MD 11/21/2021 12:35 PM  For on call review [email or web address].

## 2021-11-21 NOTE — Consult Note (Signed)
[place] Nurse ostomy consult note Consult received for new colostomy created intraoperatively by Dr. Hampton Abbot 11/20/21.  [facility] will see on Monday, 10/23.  [place] nursing team will follow, and will remain available to this patient, the nursing, surgical and medical teams.    Thank you for inviting [place] to participate in this patient's Plan of Care.  Maudie Flakes, MSN, RN, CNS, [place], Serita Grammes, [employer], [employer] phone:  [phone]

## 2021-11-21 NOTE — Progress Notes (Signed)
   11/21/21 0434  Assess: MEWS Score  Temp 97.6 F (36.4 C)  BP (!) 156/57  MAP (mmHg) 84  Pulse Rate (!) 115  SpO2 96 %  O2 Device Room Air  Assess: MEWS Score  MEWS Temp 0  MEWS Systolic 0  MEWS Pulse 2  MEWS RR 0  MEWS LOC 0  MEWS Score 2  MEWS Score Color Yellow  Assess: if the MEWS score is Yellow or Red  Were vital signs taken at a resting state? Yes  Focused Assessment No change from prior assessment  Does the patient meet 2 or more of the SIRS criteria? Yes  Does the patient have a confirmed or suspected source of infection? Yes  Provider and Rapid Response Notified? No  MEWS guidelines implemented *See Row Information* Yes  Treat  MEWS Interventions Administered scheduled meds/treatments;Administered prn meds/treatments  Pain Scale 0-10  Pain Score 7  Pain Type Surgical pain  Pain Location Abdomen  Pain Intervention(s) Medication (See eMAR)  Take Vital Signs  Increase Vital Sign Frequency  Yellow: Q 2hr X 2 then Q 4hr X 2, if remains yellow, continue Q 4hrs  Escalate  MEWS: Escalate Yellow: discuss with charge nurse/RN and consider discussing with provider and RRT  Notify: Charge Nurse/RN  Name of Charge Nurse/RN Notified Almyra Free  Date Charge Nurse/RN Notified 11/21/21  Time Charge Nurse/RN Notified 0519  Document  Patient Outcome Stabilized after interventions  Progress note created (see row info) Yes  Assess: SIRS CRITERIA  SIRS Temperature  0  SIRS Pulse 1  SIRS Respirations  0  SIRS WBC 1  SIRS Score Sum  2

## 2021-11-21 NOTE — Progress Notes (Signed)
PHARMACY - TOTAL PARENTERAL NUTRITION CONSULT NOTE   Indication: Small bowel obstruction  Patient Measurements: Height: '5\' 5"'$  (165.1 cm) Weight: 72.3 kg (159 lb 6.3 oz) IBW/kg (Calculated) : 57 TPN AdjBW (KG): 60.8 Body mass index is 26.52 kg/m.  Assessment: 53 YOF w/ PMH of anxiety, osteoarthritis, stage III chronic kidney disease, rheumatoid arthritis, type 2 diabetes mellitus, depression, hypertension, dyslipidemia, GERD, hypothyroidism and CVA, who presented with acute onset of abdominal pain all over with associated abdominal distention that started about 3 weeks ago.  Glucose / Insulin: sSSI q4h  BG 94 - 133 previous 24h  no SSI required Electrolytes: hypokalemia Renal: Scr<1, stable Hepatic: LFTs wnl --TG 154 at baseline Intake / Output;  MIVF: 0.9 % NaCl with KCl 20 mEq/ L  75 mL/hr GI Imaging: GI Surgeries / Procedures:   Central access: 11/21/21 (pending placement) TPN start date: 11/21/21  Nutritional Goals: Goal TPN rate is 80 mL/hr (provides 111 g of protein and 1985.5 kcals per day)  RD Assessment: Estimated Needs Total Energy Estimated Needs: 1800-2000 Total Protein Estimated Needs: 100-115 grams Total Fluid Estimated Needs: > 1.8 L  Current Nutrition:  NPO  Plan:  Start TPN at 40m/hr at 1800 Nutritional components Amino acids (using Travasol 10%): 55.7 grams Dextrose: 128 grams Lispids (using 20% SMOFlipids): 30 grams kCal 992.7 Electrolytes in TPN (standard): Na 525m/L, K 505mL, Ca 5mE22m, Mg 5mEq37m and Phos 15mmo73m Cl:Ac 1:1 Add standard MVI and trace elements to TPN 10 mEq IV KCl x 4 continue Sensitive q4h SSI and adjust as needed  Monitor TPN labs on Mon/Thurs, daily until stable  Brelyn Simmons A Katelyn Simmons 11/21/2021,1:11 PM

## 2021-11-21 NOTE — Progress Notes (Signed)
POD # 1 Doing well Some stool in bag 140 cc serosanguinous JP NG 600 bile Good UO Labs ok   PE NAD Abd: soft, dressing intact, ostomy viable and patent w stool.  Serosanguinous JP  A/p Doing well Expected ileus PICC TPN May start clamping trial tomorrow Mobilize Dc foley

## 2021-11-22 ENCOUNTER — Inpatient Hospital Stay: Payer: Medicare HMO

## 2021-11-22 DIAGNOSIS — E876 Hypokalemia: Secondary | ICD-10-CM | POA: Diagnosis not present

## 2021-11-22 DIAGNOSIS — K56609 Unspecified intestinal obstruction, unspecified as to partial versus complete obstruction: Secondary | ICD-10-CM | POA: Diagnosis not present

## 2021-11-22 LAB — GLUCOSE, CAPILLARY
Glucose-Capillary: 138 mg/dL — ABNORMAL HIGH (ref 70–99)
Glucose-Capillary: 141 mg/dL — ABNORMAL HIGH (ref 70–99)
Glucose-Capillary: 147 mg/dL — ABNORMAL HIGH (ref 70–99)
Glucose-Capillary: 147 mg/dL — ABNORMAL HIGH (ref 70–99)
Glucose-Capillary: 152 mg/dL — ABNORMAL HIGH (ref 70–99)
Glucose-Capillary: 156 mg/dL — ABNORMAL HIGH (ref 70–99)

## 2021-11-22 LAB — BASIC METABOLIC PANEL
Anion gap: 5 (ref 5–15)
BUN: 19 mg/dL (ref 8–23)
CO2: 30 mmol/L (ref 22–32)
Calcium: 8.3 mg/dL — ABNORMAL LOW (ref 8.9–10.3)
Chloride: 102 mmol/L (ref 98–111)
Creatinine, Ser: 0.82 mg/dL (ref 0.44–1.00)
GFR, Estimated: >60 mL/min (ref >60)
Glucose, Bld: 152 mg/dL — ABNORMAL HIGH (ref 70–99)
Potassium: 3.1 mmol/L — ABNORMAL LOW (ref 3.5–5.1)
Sodium: 137 mmol/L (ref 135–145)

## 2021-11-22 LAB — CBC
HCT: 26.1 % — ABNORMAL LOW (ref 36.0–46.0)
Hemoglobin: 8.6 g/dL — ABNORMAL LOW (ref 12.0–15.0)
MCH: 30.9 pg (ref 26.0–34.0)
MCHC: 33 g/dL (ref 30.0–36.0)
MCV: 93.9 fL (ref 80.0–100.0)
Platelets: 342 10*3/uL (ref 150–400)
RBC: 2.78 MIL/uL — ABNORMAL LOW (ref 3.87–5.11)
RDW: 15.2 % (ref 11.5–15.5)
WBC: 8.2 10*3/uL (ref 4.0–10.5)
nRBC: 0 % (ref 0.0–0.2)

## 2021-11-22 LAB — MAGNESIUM: Magnesium: 1.7 mg/dL (ref 1.7–2.4)

## 2021-11-22 MED ORDER — ENOXAPARIN SODIUM 40 MG/0.4ML IJ SOSY
40.0000 mg | PREFILLED_SYRINGE | INTRAMUSCULAR | Status: DC
  Administered 2021-11-22 – 2021-11-25 (×4): 40 mg via SUBCUTANEOUS
  Filled 2021-11-22 (×4): qty 0.4

## 2021-11-22 MED ORDER — TRAVASOL 10 % IV SOLN
INTRAVENOUS | Status: AC
  Filled 2021-11-22: qty 1113.6

## 2021-11-22 MED ORDER — MAGNESIUM SULFATE 2 GM/50ML IV SOLN
2.0000 g | Freq: Once | INTRAVENOUS | Status: AC
  Administered 2021-11-22: 2 g via INTRAVENOUS
  Filled 2021-11-22: qty 50

## 2021-11-22 MED ORDER — KCL-LACTATED RINGERS 20 MEQ/L IV SOLN: INTRAVENOUS | Status: DC

## 2021-11-22 MED ORDER — POTASSIUM CHLORIDE 10 MEQ/100ML IV SOLN
10.0000 meq | INTRAVENOUS | Status: AC
  Administered 2021-11-22 (×3): 10 meq via INTRAVENOUS
  Filled 2021-11-22 (×3): qty 100

## 2021-11-22 NOTE — Progress Notes (Signed)
  Progress Note   Patient: Katelyn Simmons DOB: 02/28/1945 DOA: 11/18/2021     4 DOS: the patient was seen and examined on 11/22/2021   Brief hospital course: Katelyn Simmons is a 76 y.o. Caucasian female with medical history significant for anxiety, osteoarthritis, stage III chronic kidney disease, rheumatoid arthritis, type 2 diabetes mellitus, depression, hypertension, dyslipidemia, GERD, hypothyroidism and CVA, who presented to the emergency room with acute onset of abdominal pain all over with associated abdominal distention that started about 3 weeks ago.  Abdominal pain worsened for the last 3 days, she also started having nausea vomiting.  However, she still had a several loose stools yesterday. CT scan showed colon obstruction at the sigmoid level.  Patient was placed on IV fluids, n.p.o. and NG suction.  General surgery consult obtained.  Colectomy and colostomy performed on 10/20.  Assessment and Plan: * Large bowel obstruction (Katelyn Simmons) Status post colectomy and colostomy. Patient did not seem to have any malignancy, but obstruction was caused by adhesion. Status post surgery, currently doing well.  Followed by general surgery, on TPN.  Also started IV fluids.   Hypomagnesemia Hypokalemia. IV potassium given.     Hypothyroidism On Synthroid.   Controlled type 2 diabetes mellitus without complication, without long-term current use of insulin (HCC) Continue sliding scale insulin.   Anxiety and depression Continue antidepressants.   Essential hypertension Continue metoprolol.      Subjective:  Patient feels well today, no nausea vomiting abdominal pain.  Physical Exam: Vitals:   11/21/21 1554 11/21/21 2023 11/22/21 0435 11/22/21 0800  BP: 133/64 (!) 137/59 (!) 135/50 (!) 129/53  Pulse: 92 88 84 84  Resp: '16 16 20 18  '$ Temp: 98.2 F (36.8 C) 98.3 F (36.8 C) 98.4 F (36.9 C) 98.7 F (37.1 C)  TempSrc: Oral   Oral  SpO2: 95% 95% 92% 96%  Weight:       Height:       General exam: Appears calm and comfortable  Respiratory system: Clear to auscultation. Respiratory effort normal. Cardiovascular system: S1 & S2 heard, RRR. No JVD, murmurs, rubs, gallops or clicks. No pedal edema. Gastrointestinal system: Abdomen is nondistended, soft and nontender. No organomegaly or masses felt. Normal bowel sounds heard. Central nervous system: Alert and oriented. No focal neurological deficits. Extremities: Symmetric 5 x 5 power. Skin: No rashes, lesions or ulcers Psychiatry: Judgement and insight appear normal. Mood & affect appropriate.   Data Reviewed:  Lab results reviewed.  Family Communication:   Disposition: Status is: Inpatient Remains inpatient appropriate because: Severity of disease, TPN.  Postop.  Planned Discharge Destination: Home    Time spent: 35 minutes  Author: Sharen Hones, MD 11/22/2021 10:25 AM  For on call review [email or web address].

## 2021-11-22 NOTE — Plan of Care (Signed)
Pt alert and oriented x 4. Up 1 assist to bedside commode for urination. NG tube in place 550 removed during overnight. JP -165 output . Colostomy 275 cc output. Pt reports resting better during overnight. No prn meds given. Only scheduled tylenol received.  Problem: Education: Goal: Knowledge of General Education information will improve Description: Including pain rating scale, medication(s)/side effects and non-pharmacologic comfort measures Outcome: Progressing   Problem: Health Behavior/Discharge Planning: Goal: Ability to manage health-related needs will improve Outcome: Progressing   Problem: Clinical Measurements: Goal: Ability to maintain clinical measurements within normal limits will improve Outcome: Progressing Goal: Will remain free from infection Outcome: Progressing Goal: Diagnostic test results will improve Outcome: Progressing Goal: Respiratory complications will improve Outcome: Progressing Goal: Cardiovascular complication will be avoided Outcome: Progressing   Problem: Activity: Goal: Risk for activity intolerance will decrease Outcome: Progressing   Problem: Nutrition: Goal: Adequate nutrition will be maintained Outcome: Progressing   Problem: Coping: Goal: Level of anxiety will decrease Outcome: Progressing   Problem: Elimination: Goal: Will not experience complications related to bowel motility Outcome: Progressing Goal: Will not experience complications related to urinary retention Outcome: Progressing   Problem: Pain Managment: Goal: General experience of comfort will improve Outcome: Progressing   Problem: Safety: Goal: Ability to remain free from injury will improve Outcome: Progressing   Problem: Skin Integrity: Goal: Risk for impaired skin integrity will decrease Outcome: Progressing   Problem: Education: Goal: Ability to describe self-care measures that may prevent or decrease complications (Diabetes Survival Skills Education) will  improve Outcome: Progressing Goal: Individualized Educational Video(s) Outcome: Progressing   Problem: Coping: Goal: Ability to adjust to condition or change in health will improve Outcome: Progressing   Problem: Fluid Volume: Goal: Ability to maintain a balanced intake and output will improve Outcome: Progressing   Problem: Health Behavior/Discharge Planning: Goal: Ability to identify and utilize available resources and services will improve Outcome: Progressing Goal: Ability to manage health-related needs will improve Outcome: Progressing   Problem: Metabolic: Goal: Ability to maintain appropriate glucose levels will improve Outcome: Progressing   Problem: Nutritional: Goal: Maintenance of adequate nutrition will improve Outcome: Progressing Goal: Progress toward achieving an optimal weight will improve Outcome: Progressing   Problem: Skin Integrity: Goal: Risk for impaired skin integrity will decrease Outcome: Progressing   Problem: Tissue Perfusion: Goal: Adequacy of tissue perfusion will improve Outcome: Progressing

## 2021-11-22 NOTE — Progress Notes (Signed)
POD # 2 Doing well stool in bag 305 cc serous JP NG 550 bile Good UO Labs ok KUB w persistent SB distension but air in colon     PE NAD Abd: soft, dressing intact w penrose , ostomy viable and patent w stool.  Serous JP   A/p Doing well Expected ileus May start clamping trial Continue TPN Mobilize

## 2021-11-22 NOTE — Progress Notes (Signed)
PHARMACY - TOTAL PARENTERAL NUTRITION CONSULT NOTE   Indication: Small bowel obstruction  Patient Measurements: Height: '5\' 5"'$  (165.1 cm) Weight: 72.3 kg (159 lb 6.3 oz) IBW/kg (Calculated) : 57 TPN AdjBW (KG): 60.8 Body mass index is 26.52 kg/m.  Assessment: 1 YOF w/ PMH of anxiety, osteoarthritis, stage III chronic kidney disease, rheumatoid arthritis, type 2 diabetes mellitus, depression, hypertension, dyslipidemia, GERD, hypothyroidism and CVA, who presented with acute onset of abdominal pain all over with associated abdominal distention that started about 3 weeks ago.  Glucose / Insulin: sSSI q4h  BG 120 - 147 previous 24h  4 units in last 24h Electrolytes: being replaced Renal: Scr<1, stable Hepatic: LFTs wnl --TG 154 at baseline Intake / Output;  MIVF: 0.9 % NaCl with KCl 20 mEq/ L  75 mL/hr GI Imaging: GI Surgeries / Procedures:   Central access: 11/21/21 (pending placement) TPN start date: 11/21/21  Nutritional Goals: Goal TPN rate is 80 mL/hr (provides 111 g of protein and 1985.5 kcals per day)  RD Assessment: Estimated Needs Total Energy Estimated Needs: 1800-2000 Total Protein Estimated Needs: 100-115 grams Total Fluid Estimated Needs: > 1.8 L  Current Nutrition:  NPO  Plan:  Increase TPN to goal rate of 18m/hr at 1800 Nutritional components Amino acids (using Travasol 10%): 111.4 grams Dextrose: 276 grams Lispids (using 20% SMOFlipids): 60 grams kCal 1985 Electrolytes in TPN (standard): Na 555m/L, K 505mL, Ca 5mE70m, Mg 5mEq29m and Phos 15mmo44m Cl:Ac 1:1 Add standard MVI and trace elements to TPN 10 mEq IV KCl x 3, Mag sulfate 2g IV x 1 continue Sensitive q4h SSI and adjust as needed  Monitor TPN labs on Mon/Thurs, daily until stable  Katelyn Simmons 11/22/2021,9:05 AM

## 2021-11-23 ENCOUNTER — Ambulatory Visit: Payer: Medicare HMO | Admitting: Surgery

## 2021-11-23 DIAGNOSIS — K56609 Unspecified intestinal obstruction, unspecified as to partial versus complete obstruction: Secondary | ICD-10-CM | POA: Diagnosis not present

## 2021-11-23 DIAGNOSIS — I10 Essential (primary) hypertension: Secondary | ICD-10-CM | POA: Diagnosis not present

## 2021-11-23 LAB — COMPREHENSIVE METABOLIC PANEL
ALT: 15 U/L (ref 0–44)
AST: 21 U/L (ref 15–41)
Albumin: 2 g/dL — ABNORMAL LOW (ref 3.5–5.0)
Alkaline Phosphatase: 56 U/L (ref 38–126)
Anion gap: 7 (ref 5–15)
BUN: 22 mg/dL (ref 8–23)
CO2: 30 mmol/L (ref 22–32)
Calcium: 8.7 mg/dL — ABNORMAL LOW (ref 8.9–10.3)
Chloride: 104 mmol/L (ref 98–111)
Creatinine, Ser: 0.69 mg/dL (ref 0.44–1.00)
GFR, Estimated: >60 mL/min (ref >60)
Glucose, Bld: 150 mg/dL — ABNORMAL HIGH (ref 70–99)
Potassium: 3.4 mmol/L — ABNORMAL LOW (ref 3.5–5.1)
Sodium: 141 mmol/L (ref 135–145)
Total Bilirubin: 0.4 mg/dL (ref 0.3–1.2)
Total Protein: 5.5 g/dL — ABNORMAL LOW (ref 6.5–8.1)

## 2021-11-23 LAB — GLUCOSE, CAPILLARY
Glucose-Capillary: 153 mg/dL — ABNORMAL HIGH (ref 70–99)
Glucose-Capillary: 161 mg/dL — ABNORMAL HIGH (ref 70–99)
Glucose-Capillary: 163 mg/dL — ABNORMAL HIGH (ref 70–99)
Glucose-Capillary: 165 mg/dL — ABNORMAL HIGH (ref 70–99)
Glucose-Capillary: 177 mg/dL — ABNORMAL HIGH (ref 70–99)
Glucose-Capillary: 199 mg/dL — ABNORMAL HIGH (ref 70–99)

## 2021-11-23 LAB — MAGNESIUM: Magnesium: 1.8 mg/dL (ref 1.7–2.4)

## 2021-11-23 LAB — TRIGLYCERIDES: Triglycerides: 149 mg/dL (ref <150)

## 2021-11-23 LAB — FERRITIN: Ferritin: 57 ng/mL (ref 11–307)

## 2021-11-23 LAB — IRON AND TIBC
Iron: 30 ug/dL (ref 28–170)
Saturation Ratios: 12 % (ref 10.4–31.8)
TIBC: 258 ug/dL (ref 250–450)
UIBC: 228 ug/dL

## 2021-11-23 LAB — PHOSPHORUS: Phosphorus: 1.9 mg/dL — ABNORMAL LOW (ref 2.5–4.6)

## 2021-11-23 MED ORDER — PANTOPRAZOLE SODIUM 40 MG IV SOLR
40.0000 mg | Freq: Two times a day (BID) | INTRAVENOUS | Status: DC
  Administered 2021-11-23 – 2021-11-26 (×7): 40 mg via INTRAVENOUS
  Filled 2021-11-23 (×7): qty 10

## 2021-11-23 MED ORDER — TRAVASOL 10 % IV SOLN
INTRAVENOUS | Status: AC
  Filled 2021-11-23: qty 1113.6

## 2021-11-23 MED ORDER — POTASSIUM PHOSPHATES 15 MMOLE/5ML IV SOLN
30.0000 mmol | Freq: Once | INTRAVENOUS | Status: AC
  Administered 2021-11-23: 30 mmol via INTRAVENOUS
  Filled 2021-11-23: qty 10

## 2021-11-23 NOTE — Progress Notes (Signed)
PHARMACY - TOTAL PARENTERAL NUTRITION CONSULT NOTE   Indication: Small bowel obstruction  Patient Measurements: Height: '5\' 5"'$  (165.1 cm) Weight: 72.3 kg (159 lb 6.3 oz) IBW/kg (Calculated) : 57 TPN AdjBW (KG): 60.8 Body mass index is 26.52 kg/m.  Assessment: 76 YOF w/ PMH of anxiety, osteoarthritis, stage III chronic kidney disease, rheumatoid arthritis, type 2 diabetes mellitus, depression, hypertension, dyslipidemia, GERD, hypothyroidism and CVA, who presented with acute onset of abdominal pain all over with associated abdominal distention that started about 3 weeks ago.  Glucose / Insulin: sSSI q4h  BG 138-177 previous 24h (gluc <180) 12 units in last 24h Electrolytes: K 3.1>3.4; Phos 3.9>1.9; Renal: Scr 1.18>0.82>0.69, improved to WNL.  Hepatic: LFTs wnl --TG 154 at baseline >> 149 10/23 Intake / Output: UOP 0.1-0.42m/k/h [I/O net +6.1L] MIVF: LRw20K '@75'$  > Stopped  mL/hr GI Imaging: GI Surgeries / Procedures:   Central access: 11/21/21 (pending placement) TPN start date: 11/21/21  Nutritional Goals: Goal TPN rate is 80 mL/hr (provides 111 g of protein and 1985.5 kcals per day)  RD Assessment: Estimated Needs Total Energy Estimated Needs: 1800-2000 Total Protein Estimated Needs: 100-115 grams Total Fluid Estimated Needs: > 1.8 L  Current Nutrition:  NPO  Plan:  Continue TPN at goal rate of 846mhr at 1800 Nutritional components Amino acids (using Travasol 10%): 111.4 grams Dextrose: 276 grams Lipids (using 20% SMOFlipids): 60 grams kCal 1985 Electrolytes in TPN (standard): Na 5058mL, K 21m23m, Ca 5mEq69m Mg 5mEq/47mand Phos 15mmol79mCl:Ac 1:1 Add standard MVI and trace elements to TPN Will give KPhos 30mmol 21m1 (supplies 44meq K+75m 3.1>3.4; Phos 3.9>1.9) Continue Sensitive q4h SSI and adjust as needed  Monitor TPN labs on Mon/Thurs, daily until stable  Mar Walmer DLorna Dibble23,8:06 AM

## 2021-11-23 NOTE — Evaluation (Signed)
Physical Therapy Evaluation Patient Details Name: Katelyn Simmons MRN: [id number] DOB: 30-Sep-1945 Today's Date: 11/23/2021  History of Present Illness  Pt admitted for large bowel obstructions s/p colectomy and colostomy on 11/20/21. History includes anxiety, OA, CKD, RA, DM, depression, HTN, GERD, CVA. INitial complaints of abdominal pain with N/V.  Clinical Impression  Pt is a pleasant 76 year old female who was admitted for large bowel obstruction s/p colectomy and colostomy. Pt performs bed mobility/transfers with cga and ambulation with supervision and RW. NG tube clamped for ambulation. Pt reports just recently ambulating around unit with RN. Pt demonstrates deficits with endurance/mobility. Would benefit from skilled PT to address above deficits and promote optimal return to PLOF. Recommend transition to  upon discharge from acute hospitalization.      Recommendations for follow up therapy are one component of a multi-disciplinary discharge planning process, led by the attending physician.  Recommendations may be updated based on patient status, additional functional criteria and insurance authorization.  Follow Up Recommendations Home health PT      Assistance Recommended at Discharge PRN  Patient can return home with the following  A little help with walking and/or transfers;Help with stairs or ramp for entrance    Equipment Recommendations BSC/3in1  Recommendations for Other Services       Functional Status Assessment Patient has had a recent decline in their functional status and demonstrates the ability to make significant improvements in function in a reasonable and predictable amount of time.     Precautions / Restrictions Precautions Precautions: Fall Restrictions Weight Bearing Restrictions: No      Mobility  Bed Mobility Overal bed mobility: Needs Assistance Bed Mobility: Supine to Sit     Supine to sit: Min guard     General bed mobility comments:  safe technique with upright posture    Transfers Overall transfer level: Needs assistance Equipment used: Rolling walker (2 wheels) Transfers: Sit to/from Stand Sit to Stand: Min guard           General transfer comment: safe technique with RW    Ambulation/Gait Ambulation/Gait assistance: Supervision Gait Distance (Feet): 200 Feet Assistive device: Rolling walker (2 wheels) Gait Pattern/deviations: Step-through pattern       General Gait Details: ambulated around RN station with slightly forward flexed posture. RW used with good gait speed  Financial trader Rankin (Stroke Patients Only)       Balance Overall balance assessment: Needs assistance Sitting-balance support: Feet supported Sitting balance-Leahy Scale: Good     Standing balance support: Bilateral upper extremity supported Standing balance-Leahy Scale: Good                               Pertinent Vitals/Pain Pain Assessment Pain Assessment: 0-10 Pain Score: 2  Pain Location: abdomen Pain Descriptors / Indicators: Aching Pain Intervention(s): Limited activity within patient's tolerance    Home Living Family/patient expects to be discharged to:: Private residence Living Arrangements: Alone Available Help at Discharge: Family;Available 24 hours/day Type of Home: House Home Access: Ramped entrance       Home Layout: One level Home Equipment: Conservation officer, nature (2 wheels);Cane - single point Additional Comments: Pt plans to dc to sister's home which has 4 steps, no rails, and is 1 level    Prior Function Prior Level of Function : Independent/Modified Independent  Mobility Comments: was previously using RW PTA and occasional use of SPC ADLs Comments: indep     Hand Dominance        Extremity/Trunk Assessment   Upper Extremity Assessment Upper Extremity Assessment: Overall WFL for tasks assessed    Lower Extremity  Assessment Lower Extremity Assessment: Overall WFL for tasks assessed       Communication   Communication: No difficulties  Cognition Arousal/Alertness: Awake/alert Behavior During Therapy: WFL for tasks assessed/performed Overall Cognitive Status: Within Functional Limits for tasks assessed                                          General Comments      Exercises     Assessment/Plan    PT Assessment Patient needs continued PT services  PT Problem List Decreased strength;Decreased mobility       PT Treatment Interventions Gait training;Stair training;Therapeutic exercise;Balance training    PT Goals (Current goals can be found in the Care Plan section)  Acute Rehab PT Goals Patient Stated Goal: to get out of the hospital PT Goal Formulation: With patient Time For Goal Achievement: 12/07/21 Potential to Achieve Goals: Good    Frequency Min 2X/week     Co-evaluation               AM-PAC PT "6 Clicks" Mobility  Outcome Measure Help needed turning from your back to your side while in a flat bed without using bedrails?: None Help needed moving from lying on your back to sitting on the side of a flat bed without using bedrails?: A Little Help needed moving to and from a bed to a chair (including a wheelchair)?: A Little Help needed standing up from a chair using your arms (e.g., wheelchair or bedside chair)?: A Little Help needed to walk in hospital room?: A Little Help needed climbing 3-5 steps with a railing? : A Little 6 Click Score: 19    End of Session   Activity Tolerance: Patient tolerated treatment well Patient left: in chair Nurse Communication: Mobility status PT Visit Diagnosis: Unsteadiness on feet (R26.81)    Time: 3664-4034 PT Time Calculation (min) (ACUTE ONLY): 23 min   Charges:   PT Evaluation $PT Eval Low Complexity: 1 Low PT Treatments $Gait Training: 8-22 mins        Greggory Stallion, PT, DPT,  [facility] [phone]   Lacreshia Bondarenko 11/23/2021, 4:55 PM

## 2021-11-23 NOTE — Consult Note (Signed)
[facility] Nurse ostomy follow up Patient receiving care in [facility]. Her eldest sister present for ostomy teaching session. Stoma type/location: LUQ colostomy Stomal assessment/size: 1 3/4 inches, moist, sutures intact, budded Peristomal assessment: intact Treatment options for stomal/peristomal skin: barrier ring Output: thin green effluent Ostomy pouching: 2pc. 2 and 3/4 inch system. Use ostomy pouch Kellie Simmering [phone]; skin barrier Kellie Simmering #2; barrier ring Kellie Simmering [phone] Education provided: entire process, emptying, opening/closing/removing, new system preparation and application shown to patient and her sister. Enrolled patient in [place] Discharge program: Yes, today. Successful fax transmittal sheet obtained.  Sister is requesting second teaching session Wednesday at 0900.  This information placed on the [facility] f/u communication list.  Val Riles, RN, MSN, CWOCN, CNS-BC, pager [phone]

## 2021-11-23 NOTE — Progress Notes (Addendum)
  Progress Note   Patient: Katelyn Simmons:[id number] DOB: 1945-02-27 DOA: 11/18/2021     5 DOS: the patient was seen and examined on 11/23/2021   Brief hospital course: Katelyn Simmons is a 76 y.o. Caucasian female with medical history significant for anxiety, osteoarthritis, stage III chronic kidney disease, rheumatoid arthritis, type 2 diabetes mellitus, depression, hypertension, dyslipidemia, GERD, hypothyroidism and CVA, who presented to the emergency room with acute onset of abdominal pain all over with associated abdominal distention that started about 3 weeks ago.  Abdominal pain worsened for the last 3 days, she also started having nausea vomiting.  However, she still had a several loose stools yesterday. CT scan showed colon obstruction at the sigmoid level.  Patient was placed on IV fluids, n.p.o. and NG suction.  General surgery consult obtained.  Colectomy and colostomy performed on 10/20.  Assessment and Plan: * Large bowel obstruction ([place]) Status post colectomy and colostomy. Patient did not seem to have any malignancy, but obstruction was caused by adhesion. NG suction still has significant output.  General surgery will continue suction.  Continue TPN.  Patient has significant amount of ostomy output.   Hypomagnesemia Hypokalemia. Hypophosphatemia. CKD ruled out Give her 30 mmol of potassium phosphate.     Hypothyroidism On Synthroid.   Controlled type 2 diabetes mellitus without complication, without long-term current use of insulin (HCC) Continue sliding scale insulin.   Anxiety and depression Continue antidepressants.   Essential hypertension Continue metoprolol.  Acute anemia. Probably from acute illness, some bleeding from surgery.  Check iron B12 level.    Subjective:  Patient doing well today, no abdominal pain or nausea vomiting.  Physical Exam: Vitals:   11/22/21 1720 11/22/21 2106 11/23/21 0507 11/23/21 0819  BP: (!) 157/54 (!) 134/46 (!) 150/51  (!) 153/52  Pulse: 84 99 95 (!) 108  Resp: '18 18 16 16  '$ Temp: 98.5 F (36.9 C) 98.7 F (37.1 C) 98.5 F (36.9 C) 98.1 F (36.7 C)  TempSrc: Oral   Oral  SpO2: 94% 94% 96% 96%  Weight:      Height:       General exam: Appears calm and comfortable  Respiratory system: Clear to auscultation. Respiratory effort normal. Cardiovascular system: S1 & S2 heard, RRR. No JVD, murmurs, rubs, gallops or clicks. No pedal edema. Gastrointestinal system: Abdomen is nondistended, soft and nontender. No organomegaly or masses felt. Normal bowel sounds heard. Central nervous system: Alert and oriented x3. No focal neurological deficits. Extremities: Symmetric 5 x 5 power. Skin: No rashes, lesions or ulcers Psychiatry: Judgement and insight appear normal. Mood & affect appropriate.   Data Reviewed:  Lab results reviewed.  Family Communication: Sister at the bedside.  Disposition: Status is: Inpatient Remains inpatient appropriate because: Severity of disease, postop, TPN.  Planned Discharge Destination: Home with Home Health    Time spent: 35 minutes  Author: Sharen Hones, MD 11/23/2021 10:55 AM  For on call review [email or web address].

## 2021-11-23 NOTE — Plan of Care (Signed)
Pt alert and oriented x 4. UP with 1 assist to bsc during overnight. NO prns given. Vitals stable. Afebrile. TPN continues at 80. BS q4 hours and below 200 during overnight. JP drain 45 output. 150 from colostomy and 700 output from NG. Pt denies pain only reports gas.  Problem: Education: Goal: Knowledge of General Education information will improve Description: Including pain rating scale, medication(s)/side effects and non-pharmacologic comfort measures Outcome: Progressing   Problem: Health Behavior/Discharge Planning: Goal: Ability to manage health-related needs will improve Outcome: Progressing   Problem: Clinical Measurements: Goal: Ability to maintain clinical measurements within normal limits will improve Outcome: Progressing Goal: Will remain free from infection Outcome: Progressing Goal: Diagnostic test results will improve Outcome: Progressing Goal: Respiratory complications will improve Outcome: Progressing Goal: Cardiovascular complication will be avoided Outcome: Progressing   Problem: Activity: Goal: Risk for activity intolerance will decrease Outcome: Progressing   Problem: Nutrition: Goal: Adequate nutrition will be maintained Outcome: Progressing   Problem: Coping: Goal: Level of anxiety will decrease Outcome: Progressing   Problem: Elimination: Goal: Will not experience complications related to bowel motility Outcome: Progressing Goal: Will not experience complications related to urinary retention Outcome: Progressing   Problem: Pain Managment: Goal: General experience of comfort will improve Outcome: Progressing   Problem: Safety: Goal: Ability to remain free from injury will improve Outcome: Progressing   Problem: Skin Integrity: Goal: Risk for impaired skin integrity will decrease Outcome: Progressing   Problem: Education: Goal: Ability to describe self-care measures that may prevent or decrease complications (Diabetes Survival Skills  Education) will improve Outcome: Progressing Goal: Individualized Educational Video(s) Outcome: Progressing   Problem: Coping: Goal: Ability to adjust to condition or change in health will improve Outcome: Progressing   Problem: Fluid Volume: Goal: Ability to maintain a balanced intake and output will improve Outcome: Progressing   Problem: Health Behavior/Discharge Planning: Goal: Ability to identify and utilize available resources and services will improve Outcome: Progressing Goal: Ability to manage health-related needs will improve Outcome: Progressing   Problem: Metabolic: Goal: Ability to maintain appropriate glucose levels will improve Outcome: Progressing   Problem: Nutritional: Goal: Maintenance of adequate nutrition will improve Outcome: Progressing Goal: Progress toward achieving an optimal weight will improve Outcome: Progressing   Problem: Skin Integrity: Goal: Risk for impaired skin integrity will decrease Outcome: Progressing   Problem: Tissue Perfusion: Goal: Adequacy of tissue perfusion will improve Outcome: Progressing

## 2021-11-23 NOTE — Progress Notes (Signed)
11/23/2021 Subjective: Patient is 3 Days Post-Op s/p exlap and Hartmann's for large bowel obstruction.  No acute events.  Patient feels hungry, but had 700 ml output from NG tube overnight, total 1150 ml for the 24 hrs.  KUB yesterday showing dilated loops of small bowel.  Vital signs: Temp:  [98.5 F (36.9 C)-98.7 F (37.1 C)] 98.5 F (36.9 C) (10/23 0507) Pulse Rate:  [84-99] 95 (10/23 0507) Resp:  [16-18] 16 (10/23 0507) BP: (134-157)/(46-54) 150/51 (10/23 0507) SpO2:  [94 %-96 %] 96 % (10/23 0507)   Intake/Output: 10/22 0701 - 10/23 0700 In: 2071.4 [I.V.:1414; NG/GT:100; IV Piggyback:557.4] Out: 2000 [Urine:300; Emesis/NG output:1150; Drains:200; Stool:350] Last BM Date : 11/22/21  Physical Exam: Constitutional:  No acute distress Abdomen:  soft, distended, appropriately sore to palpation.  Midline incision clean, with dressing in place and staples/penrose.  Blake drain with serosanguinous fluid.  Ostomy with liquid stool, mucosal edema but viable.  Labs:  Recent Labs    11/21/21 0844 11/22/21 0444  WBC 9.5 8.2  HGB 10.8* 8.6*  HCT 34.0* 26.1*  PLT 418* 342   Recent Labs    11/22/21 0444 11/23/21 0513  NA 137 141  K 3.1* 3.4*  CL 102 104  CO2 30 30  GLUCOSE 152* 150*  BUN 19 22  CREATININE 0.82 0.69  CALCIUM 8.3* 8.7*   No results for input(s): "LABPROT", "INR" in the last 72 hours.  Imaging: No results found.  Assessment/Plan: This is a 76 y.o. female s/p Hartmann's  --Discussed with patient that even though there is output from the ostomy, her KUB yesterday shows distended loops of small bowel consistent with ileus, and her NG output was still high yesterday and overnight.  Would keep NG today to suction still, awaiting better return of bowel function. --Continue IV TPN for nutrition, replete electrolytes as needed. --OOB, ambulate. --PT consult --WOC RN consult to start teaching ostomy care.   Melvyn Neth, [facility]

## 2021-11-24 DIAGNOSIS — K56609 Unspecified intestinal obstruction, unspecified as to partial versus complete obstruction: Secondary | ICD-10-CM | POA: Diagnosis not present

## 2021-11-24 LAB — BASIC METABOLIC PANEL
Anion gap: 5 (ref 5–15)
BUN: 28 mg/dL — ABNORMAL HIGH (ref 8–23)
CO2: 28 mmol/L (ref 22–32)
Calcium: 8.6 mg/dL — ABNORMAL LOW (ref 8.9–10.3)
Chloride: 104 mmol/L (ref 98–111)
Creatinine, Ser: 0.65 mg/dL (ref 0.44–1.00)
GFR, Estimated: >60 mL/min (ref >60)
Glucose, Bld: 159 mg/dL — ABNORMAL HIGH (ref 70–99)
Potassium: 3.6 mmol/L (ref 3.5–5.1)
Sodium: 137 mmol/L (ref 135–145)

## 2021-11-24 LAB — PHOSPHORUS: Phosphorus: 3.3 mg/dL (ref 2.5–4.6)

## 2021-11-24 LAB — CBC
HCT: 25.3 % — ABNORMAL LOW (ref 36.0–46.0)
Hemoglobin: 8.5 g/dL — ABNORMAL LOW (ref 12.0–15.0)
MCH: 31 pg (ref 26.0–34.0)
MCHC: 33.6 g/dL (ref 30.0–36.0)
MCV: 92.3 fL (ref 80.0–100.0)
Platelets: 332 10*3/uL (ref 150–400)
RBC: 2.74 MIL/uL — ABNORMAL LOW (ref 3.87–5.11)
RDW: 15.1 % (ref 11.5–15.5)
WBC: 7.6 10*3/uL (ref 4.0–10.5)
nRBC: 0 % (ref 0.0–0.2)

## 2021-11-24 LAB — GLUCOSE, CAPILLARY
Glucose-Capillary: 138 mg/dL — ABNORMAL HIGH (ref 70–99)
Glucose-Capillary: 160 mg/dL — ABNORMAL HIGH (ref 70–99)
Glucose-Capillary: 165 mg/dL — ABNORMAL HIGH (ref 70–99)
Glucose-Capillary: 166 mg/dL — ABNORMAL HIGH (ref 70–99)
Glucose-Capillary: 170 mg/dL — ABNORMAL HIGH (ref 70–99)
Glucose-Capillary: 172 mg/dL — ABNORMAL HIGH (ref 70–99)

## 2021-11-24 LAB — MAGNESIUM: Magnesium: 1.6 mg/dL — ABNORMAL LOW (ref 1.7–2.4)

## 2021-11-24 LAB — SURGICAL PATHOLOGY

## 2021-11-24 LAB — VITAMIN B12: Vitamin B-12: 1860 pg/mL — ABNORMAL HIGH (ref 180–914)

## 2021-11-24 MED ORDER — MAGNESIUM SULFATE 2 GM/50ML IV SOLN
2.0000 g | Freq: Once | INTRAVENOUS | Status: AC
  Administered 2021-11-24: 2 g via INTRAVENOUS
  Filled 2021-11-24: qty 50

## 2021-11-24 MED ORDER — TRAVASOL 10 % IV SOLN
INTRAVENOUS | Status: AC
  Filled 2021-11-24: qty 1113.6

## 2021-11-24 MED ORDER — MELATONIN 5 MG PO TABS: 5.0000 mg | ORAL_TABLET | Freq: Once | ORAL | Status: DC

## 2021-11-24 NOTE — Plan of Care (Addendum)
Pt alert and oriented x 4. Ambulated at hs in hallways 81f. Dsg changed to midline this am. Scant amount of serosanguinous. JP site leaking this am sutures intake. Decreased output only 20 clot noted in tubing. NG only 150 output. 225 from colostomy. Pt received 1 dose of dilaudid during overnight after ambulating in hallways.   Problem: Education: Goal: Knowledge of General Education information will improve Description: Including pain rating scale, medication(s)/side effects and non-pharmacologic comfort measures Outcome: Progressing   Problem: Health Behavior/Discharge Planning: Goal: Ability to manage health-related needs will improve Outcome: Progressing   Problem: Clinical Measurements: Goal: Ability to maintain clinical measurements within normal limits will improve Outcome: Progressing Goal: Will remain free from infection Outcome: Progressing Goal: Diagnostic test results will improve Outcome: Progressing Goal: Respiratory complications will improve Outcome: Progressing Goal: Cardiovascular complication will be avoided Outcome: Progressing   Problem: Activity: Goal: Risk for activity intolerance will decrease Outcome: Progressing   Problem: Nutrition: Goal: Adequate nutrition will be maintained Outcome: Progressing   Problem: Coping: Goal: Level of anxiety will decrease Outcome: Progressing   Problem: Elimination: Goal: Will not experience complications related to bowel motility Outcome: Progressing Goal: Will not experience complications related to urinary retention Outcome: Progressing   Problem: Pain Managment: Goal: General experience of comfort will improve Outcome: Progressing   Problem: Safety: Goal: Ability to remain free from injury will improve Outcome: Progressing   Problem: Skin Integrity: Goal: Risk for impaired skin integrity will decrease Outcome: Progressing   Problem: Education: Goal: Ability to describe self-care measures that may  prevent or decrease complications (Diabetes Survival Skills Education) will improve Outcome: Progressing Goal: Individualized Educational Video(s) Outcome: Progressing   Problem: Coping: Goal: Ability to adjust to condition or change in health will improve Outcome: Progressing   Problem: Fluid Volume: Goal: Ability to maintain a balanced intake and output will improve Outcome: Progressing   Problem: Health Behavior/Discharge Planning: Goal: Ability to identify and utilize available resources and services will improve Outcome: Progressing Goal: Ability to manage health-related needs will improve Outcome: Progressing   Problem: Metabolic: Goal: Ability to maintain appropriate glucose levels will improve Outcome: Progressing   Problem: Nutritional: Goal: Maintenance of adequate nutrition will improve Outcome: Progressing Goal: Progress toward achieving an optimal weight will improve Outcome: Progressing   Problem: Skin Integrity: Goal: Risk for impaired skin integrity will decrease Outcome: Progressing   Problem: Tissue Perfusion: Goal: Adequacy of tissue perfusion will improve Outcome: Progressing

## 2021-11-24 NOTE — Progress Notes (Signed)
PHARMACY - TOTAL PARENTERAL NUTRITION CONSULT NOTE   Indication: Small bowel obstruction  Patient Measurements: Height: '5\' 5"'$  (165.1 cm) Weight: 72.3 kg (159 lb 6.3 oz) IBW/kg (Calculated) : 57 TPN AdjBW (KG): 60.8 Body mass index is 26.52 kg/m.  Assessment: 73 YOF w/ PMH of anxiety, osteoarthritis, stage III chronic kidney disease, rheumatoid arthritis, type 2 diabetes mellitus, depression, hypertension, dyslipidemia, GERD, hypothyroidism and CVA, who presented with acute onset of abdominal pain all over with associated abdominal distention that started about 3 weeks ago.  Glucose / Insulin: sSSI q4h  BG 138-177 previous 24h (gluc <180) 12 units in last 24h Electrolytes: K 3.4>3.6; Phos 1.9>3.3; Mg 1.8>1.6 Renal: Scr 0.69>0.65, improved to WNL/Stable.  Hepatic: LFTs wnl --TG 154 at baseline >> 149 10/23 Intake / Output: UOP 0.58m/k/h [I/O net +5.8L] MIVF:GI Imaging: GI Surgeries / Procedures:   Central access: 11/21/21 (pending placement) TPN start date: 11/21/21  Nutritional Goals: Goal TPN rate is 80 mL/hr (provides 111 g of protein and 1985.5 kcals per day)  RD Assessment: Estimated Needs Total Energy Estimated Needs: 1800-2000 Total Protein Estimated Needs: 100-115 grams Total Fluid Estimated Needs: > 1.8 L  Current Nutrition:  NPO  Plan:  10/24 - KUB on 10/22  distended loops of small bowel c/w ileus however 10/24 NG output slowing (750cc). Planning NGT clamping trials, then if passes will do CLD. Continue with TPN for now at goal rate.  Continue TPN at goal rate of 877mhr at 1800 Nutritional components Amino acids (using Travasol 10%): 111.4 grams Dextrose: 276 grams Lipids (using 20% SMOFlipids): 60 grams kCal 1985 Electrolytes in TPN (standard): Na 5065mL, K 80m33m, Ca 5mEq76m Mg 5mEq/1mand Phos 15mmol55mCl:Ac 1:1 Add standard MVI and trace elements to TPN K/Phos WNL after repletion 10/23. CTM, no further repletion Mg 1.8>1.6. MD replacing with MgSO  2g IV x1 Continue Sensitive q4h SSI and adjust as needed  Monitor TPN labs on Mon/Thurs, daily until stable  BrandonErlene Quans 11/24/2021,7:47 AM

## 2021-11-24 NOTE — TOC Progression Note (Signed)
Transition of Care [facility]) - Progression Note    Patient Details  Name: Katelyn Simmons MRN: [id number] Date of Birth: 14-Jun-1945  Transition of Care [facility]) CM/SW Contact  Beverly Sessions, RN Phone Number: 11/24/2021, 11:09 AM  Clinical Narrative:     Met with patient and family at bedside. Patient did not confirm who relative was, but gave permission for this RN to discuss discharge planning Therapy recommending home health.  Patient would also benefit from Rn for ostomy. Patient agreeable and states that she does not have a preference of agency.  Referral made to [facility] with [facility]  Patient would like [facility]SC at discharge  Patient still with TPN and NG    Expected Discharge Plan: [place] Barriers to Discharge: Continued Medical Work up  Expected Discharge Plan and Services Expected Discharge Plan: [place] Choice: [place] arrangements for the past 2 months: Single Family Home                           HH Arranged: RN, PT, OT [facility] Agency: [place] Date [facility] Agency Contacted: 11/24/21   Representative spoke with at [place]: [place] ([place]) Interventions    Readmission Risk Interventions    11/24/2021   11:07 AM  Readmission Risk Prevention Plan  Transportation Screening Complete  Medication Review [employer]) Complete  HRI or [place] Complete  SW Recovery Care/Counseling Consult Complete  [place] Not Applicable

## 2021-11-24 NOTE — Care Management Important Message (Signed)
Important Message  Patient Details  Name: Katelyn Simmons MRN: [id number] Date of Birth: 30-Sep-1945   Medicare Important Message Given:  Yes     Dannette Barbara 11/24/2021, 11:40 AM

## 2021-11-24 NOTE — Progress Notes (Signed)
Patient passed clamping trial; 141m residual. NG tube removed per order

## 2021-11-24 NOTE — Progress Notes (Signed)
  Progress Note   Patient: Katelyn Simmons MKL:[id number] DOB: 11/05/1945 DOA: 11/18/2021     6 DOS: the patient was seen and examined on 11/24/2021   Brief hospital course: Katelyn Simmons is a 76 y.o. Caucasian female with medical history significant for anxiety, osteoarthritis, stage III chronic kidney disease, rheumatoid arthritis, type 2 diabetes mellitus, depression, hypertension, dyslipidemia, GERD, hypothyroidism and CVA, who presented to the emergency room with acute onset of abdominal pain all over with associated abdominal distention that started about 3 weeks ago.  Abdominal pain worsened for the last 3 days, she also started having nausea vomiting.  However, she still had a several loose stools yesterday. CT scan showed colon obstruction at the sigmoid level.  Patient was placed on IV fluids, n.p.o. and NG suction.  General surgery consult obtained.  Colectomy and colostomy performed on 10/20.  Assessment and Plan: * Large bowel obstruction ([place]) Status post colectomy and colostomy. Patient did not seem to have any malignancy, but obstruction was caused by adhesion. Patient has significant output from ostomy.  General surgery is following, probably starting a liquid diet. Still on TPN.   Hypomagnesemia Hypokalemia. Hypophosphatemia. CKD ruled out Give additional 4 g of magnesium sulfate for magnesium of 1.6.  Potassium phosphorus have normalized.     Hypothyroidism On Synthroid.   Controlled type 2 diabetes mellitus without complication, without long-term current use of insulin (HCC) Continue sliding scale insulin.   Anxiety and depression Continue antidepressants.   Essential hypertension Continue metoprolol.   Acute anemia. Patient does not have iron and B12 deficiency.  Continue to follow, transfuse as needed.      Subjective:  Patient feels well today, no abdominal pain or nausea vomiting.  NG suction has clamped.  Physical Exam: Vitals:   11/23/21 1640  11/23/21 2036 11/24/21 0546 11/24/21 0841  BP: (!) 157/52 (!) 157/54 (!) 145/58 133/61  Pulse: 98 (!) 101 91 100  Resp: '18  16 16  '$ Temp: 97.6 F (36.4 C) 98.3 F (36.8 C) 98 F (36.7 C) (!) 97.5 F (36.4 C)  TempSrc: Oral   Oral  SpO2: 99% 100% 97% 97%  Weight:      Height:       General exam: Appears calm and comfortable  Respiratory system: Clear to auscultation. Respiratory effort normal. Cardiovascular system: S1 & S2 heard, RRR. No JVD, murmurs, rubs, gallops or clicks. No pedal edema. Gastrointestinal system: Abdomen is nondistended, soft and nontender. No organomegaly or masses felt. Normal bowel sounds heard. Central nervous system: Alert and oriented. No focal neurological deficits. Extremities: Symmetric 5 x 5 power. Skin: No rashes, lesions or ulcers Psychiatry: Judgement and insight appear normal. Mood & affect appropriate.   Data Reviewed:  Lab results reviewed.  Family Communication: Son updated at bedside.  Disposition: Status is: Inpatient Remains inpatient appropriate because: Severity of disease, postop.  Planned Discharge Destination: Home with Home Health    Time spent: 35 minutes  Author: Sharen Hones, MD 11/24/2021 11:24 AM  For on call review [email or web address].

## 2021-11-24 NOTE — Progress Notes (Signed)
       CROSS COVER NOTE  NAME: Katelyn Simmons MRN: [id number] DOB : 10-31-1945 ATTENDING PHYSICIAN: Sharen Hones, MD    Date of Service   11/24/2021   HPI/Events of Note   Request for sleep aid Seroquel given already  Interventions   Assessment/Plan: Melatonin X X      This document was prepared using Dragon voice recognition software and may include unintentional dictation errors.  Katelyn Glass DNP, MBA, FNP-BC Nurse Practitioner [facility] Pager [phone]

## 2021-11-24 NOTE — Progress Notes (Signed)
[place] Hospital Day(s): 6.   Post op day(s): 4 Days Post-Op.   Interval History:  Patient seen and examined No acute events or new complaints overnight.  Without leukocytosis; 7.6K Hgb stable to 8.5 Renal function normal; sCr - 0.65; UO - 400 ccs + unmeasured Mild hypomagnesemia to 1.6 o/w no significant electrolyte derangements Surgical drain with 90 ccs out; serosanguinous NGT output recorded at 750 ccs Colostomy with out; 350 ccs  Vital signs in last 24 hours: [min-max] current  Temp:  [97.6 F (36.4 C)-98.3 F (36.8 C)] 98 F (36.7 C) (10/24 0546) Pulse Rate:  [91-108] 91 (10/24 0546) Resp:  [16-18] 16 (10/24 0546) BP: (145-157)/(52-58) 145/58 (10/24 0546) SpO2:  [96 %-100 %] 97 % (10/24 0546)     Height: '5\' 5"'$  (165.1 cm) Weight: 72.3 kg BMI (Calculated): 26.52   Intake/Output last 2 shifts:  10/23 0701 - 10/24 0700 In: 1301.9 [I.V.:731.9; NG/GT:60; IV Piggyback:510] Out: 1062 [Urine:400; Emesis/NG output:750; Drains:90; Stool:350]   Physical Exam:  Constitutional: alert, cooperative and no distress  HEENT: NGT in place Respiratory: breathing non-labored at rest  Cardiovascular: regular rate and sinus rhythm  Gastrointestinal: Soft, incisional soreness, non-distended, o rebound/guarding. Colostomy in left abdomen; gas and stool in bag. Surgical drain in RLQ; output serosanguinous  Integumentary: Midline wound CDI with staples and penrose   Labs:     Latest Ref Rng & Units 11/24/2021    6:00 AM 11/22/2021    4:44 AM 11/21/2021    8:44 AM  CBC  WBC 4.0 - 10.5 K/uL 7.6  8.2  9.5   Hemoglobin 12.0 - 15.0 g/dL 8.5  8.6  10.8   Hematocrit 36.0 - 46.0 % 25.3  26.1  34.0   Platelets 150 - 400 K/uL 332  342  418       Latest Ref Rng & Units 11/24/2021    6:00 AM 11/23/2021    5:13 AM 11/22/2021    4:44 AM  CMP  Glucose 70 - 99 mg/dL 159  150  152   BUN 8 - 23 mg/dL '28  22  19   '$ Creatinine 0.44 - 1.00 mg/dL 0.65   0.69  0.82   Sodium 135 - 145 mmol/L 137  141  137   Potassium 3.5 - 5.1 mmol/L 3.6  3.4  3.1   Chloride 98 - 111 mmol/L 104  104  102   CO2 22 - 32 mmol/L '28  30  30   '$ Calcium 8.9 - 10.3 mg/dL 8.6  8.7  8.3   Total Protein 6.5 - 8.1 g/dL  5.5    Total Bilirubin 0.3 - 1.2 mg/dL  0.4    Alkaline Phos 38 - 126 U/L  56    AST 15 - 41 U/L  21    ALT 0 - 44 U/L  15       Imaging studies: No new pertinent imaging studies   Assessment/Plan:  76 y.o. female with ROBF 4 Days Post-Op s/p exploratory laparotomy and Hartman's Procedure for large bowel obstruction secondary to sigmoid stricture.    -Given return of bowel function and decreasing NGT output, we will proceed with NGT clamping trial. Clamp GT x4 hours. If residuals are less than 150 ccs, we can remove this and initiate CLD  - Continue TPN for now; goal rate  - Monitor abdominal examination; on-going bowel function  - continue surgical drain; monitor and record output - Pain control prn; antiemetics prn   -  [place] Engaged for ostomy care/teaching    - Mobilize; therapies on board; recommending [place]  - Further management per primary service; we will follow   All of the above findings and recommendations were discussed with the patient, and the medical team, and all of patient's questions were answered to her expressed satisfaction.  -- Edison Simon, PA-C [facility] 11/24/2021, 7:06 AM M-F: 7am - 4pm

## 2021-11-25 DIAGNOSIS — K219 Gastro-esophageal reflux disease without esophagitis: Secondary | ICD-10-CM

## 2021-11-25 DIAGNOSIS — F32A Depression, unspecified: Secondary | ICD-10-CM

## 2021-11-25 DIAGNOSIS — E871 Hypo-osmolality and hyponatremia: Secondary | ICD-10-CM

## 2021-11-25 DIAGNOSIS — F419 Anxiety disorder, unspecified: Secondary | ICD-10-CM

## 2021-11-25 DIAGNOSIS — E669 Obesity, unspecified: Secondary | ICD-10-CM

## 2021-11-25 DIAGNOSIS — K56609 Unspecified intestinal obstruction, unspecified as to partial versus complete obstruction: Secondary | ICD-10-CM | POA: Diagnosis not present

## 2021-11-25 DIAGNOSIS — E876 Hypokalemia: Secondary | ICD-10-CM | POA: Diagnosis not present

## 2021-11-25 LAB — CBC
HCT: 24.9 % — ABNORMAL LOW (ref 36.0–46.0)
Hemoglobin: 8.2 g/dL — ABNORMAL LOW (ref 12.0–15.0)
MCH: 30.4 pg (ref 26.0–34.0)
MCHC: 32.9 g/dL (ref 30.0–36.0)
MCV: 92.2 fL (ref 80.0–100.0)
Platelets: 316 10*3/uL (ref 150–400)
RBC: 2.7 MIL/uL — ABNORMAL LOW (ref 3.87–5.11)
RDW: 15 % (ref 11.5–15.5)
WBC: 8.2 10*3/uL (ref 4.0–10.5)
nRBC: 0 % (ref 0.0–0.2)

## 2021-11-25 LAB — GLUCOSE, CAPILLARY
Glucose-Capillary: 115 mg/dL — ABNORMAL HIGH (ref 70–99)
Glucose-Capillary: 133 mg/dL — ABNORMAL HIGH (ref 70–99)
Glucose-Capillary: 134 mg/dL — ABNORMAL HIGH (ref 70–99)
Glucose-Capillary: 137 mg/dL — ABNORMAL HIGH (ref 70–99)
Glucose-Capillary: 143 mg/dL — ABNORMAL HIGH (ref 70–99)
Glucose-Capillary: 157 mg/dL — ABNORMAL HIGH (ref 70–99)
Glucose-Capillary: 158 mg/dL — ABNORMAL HIGH (ref 70–99)
Glucose-Capillary: 158 mg/dL — ABNORMAL HIGH (ref 70–99)
Glucose-Capillary: 166 mg/dL — ABNORMAL HIGH (ref 70–99)

## 2021-11-25 LAB — BASIC METABOLIC PANEL
Anion gap: 5 (ref 5–15)
BUN: 30 mg/dL — ABNORMAL HIGH (ref 8–23)
CO2: 28 mmol/L (ref 22–32)
Calcium: 8.9 mg/dL (ref 8.9–10.3)
Chloride: 105 mmol/L (ref 98–111)
Creatinine, Ser: 0.65 mg/dL (ref 0.44–1.00)
GFR, Estimated: >60 mL/min (ref >60)
Glucose, Bld: 147 mg/dL — ABNORMAL HIGH (ref 70–99)
Potassium: 3.7 mmol/L (ref 3.5–5.1)
Sodium: 138 mmol/L (ref 135–145)

## 2021-11-25 LAB — MAGNESIUM: Magnesium: 1.9 mg/dL (ref 1.7–2.4)

## 2021-11-25 LAB — PHOSPHORUS: Phosphorus: 3.5 mg/dL (ref 2.5–4.6)

## 2021-11-25 MED ORDER — ACETAMINOPHEN 500 MG PO TABS: 1000.0000 mg | ORAL_TABLET | Freq: Four times a day (QID) | ORAL | Status: DC | PRN

## 2021-11-25 MED ORDER — TRAVASOL 10 % IV SOLN
INTRAVENOUS | Status: AC
  Filled 2021-11-25: qty 556.8

## 2021-11-25 MED ORDER — ADULT MULTIVITAMIN W/MINERALS CH
1.0000 | ORAL_TABLET | Freq: Every day | ORAL | Status: DC
  Administered 2021-11-26: 1 via ORAL
  Filled 2021-11-25: qty 1

## 2021-11-25 MED ORDER — CALCIUM CARBONATE ANTACID 500 MG PO CHEW: 1.0000 | CHEWABLE_TABLET | Freq: Three times a day (TID) | ORAL | Status: DC | PRN

## 2021-11-25 MED ORDER — OXYCODONE HCL 5 MG PO TABS: 5.0000 mg | ORAL_TABLET | ORAL | Status: DC | PRN

## 2021-11-25 MED ORDER — ENSURE ENLIVE PO LIQD: 237.0000 mL | Freq: Three times a day (TID) | ORAL | Status: DC

## 2021-11-25 MED ORDER — GLUCERNA SHAKE PO LIQD
237.0000 mL | Freq: Three times a day (TID) | ORAL | Status: DC
  Administered 2021-11-25 – 2021-11-26 (×2): 237 mL via ORAL

## 2021-11-25 NOTE — Progress Notes (Signed)
  Progress Note   Patient: Katelyn Simmons UMP:[id number] DOB: [birth year] DOA: 11/18/2021     7 DOS: the patient was seen and examined on 11/25/2021   Brief hospital course: MAXIMINA PIROZZI is a 76 y.o. Caucasian female with medical history significant for anxiety, osteoarthritis, stage III chronic kidney disease, rheumatoid arthritis, type 2 diabetes mellitus, depression, hypertension, dyslipidemia, GERD, hypothyroidism and CVA, who presented to the emergency room with acute onset of abdominal pain all over with associated abdominal distention that started about 3 weeks ago.  Abdominal pain worsened for the last 3 days, she also started having nausea vomiting.  However, she still had a several loose stools yesterday. CT scan showed colon obstruction at the sigmoid level.  Patient was placed on IV fluids, n.p.o. and NG suction.  General surgery consult obtained.  Colectomy and colostomy performed on 10/20.  Assessment and Plan: * Large bowel obstruction (HCC) Postoperative day 5.  Having output out of the ostomy.  Ambulating.  Surgery advancing to full liquid diet today.  Cutting back the amount of TPN.  Hypomagnesemia Replaced  Hypokalemia Replaced  Dyslipidemia Continue Crestor  Hypothyroidism Continue Synthroid and Cytomel.  Obesity (BMI 30-39.9) BMI 32.43 with current height and weight in computer.  Hypophosphatemia Replaced  Hyponatremia Sodium in the normal range now  Controlled type 2 diabetes mellitus without complication, without long-term current use of insulin (HCC) Last hemoglobin A1c 5.7.  Anxiety and depression - We will continue BuSpar and Effexor XR as well as Seroquel  Essential hypertension Blood pressure stable off medications.        Subjective: Patient feeling okay.  Has some abdominal soreness.  Having output out of the ostomy.  Admitted with bowel obstruction.  Physical Exam: Vitals:   11/24/21 2045 11/25/21 0500 11/25/21 0600 11/25/21 0755  BP:  (!) 141/56  (!) 117/47 (!) 131/49  Pulse: 92  77 90  Resp: 18  16   Temp: 99.3 F (37.4 C)  99.4 F (37.4 C) 98.3 F (36.8 C)  TempSrc: Oral     SpO2: 99%  96% 97%  Weight:  88.4 kg    Height:       Physical Exam HENT:     Head: Normocephalic.     Mouth/Throat:     Pharynx: No oropharyngeal exudate.  Eyes:     General: Lids are normal.     Conjunctiva/sclera: Conjunctivae normal.  Cardiovascular:     Rate and Rhythm: Normal rate and regular rhythm.     Heart sounds: Normal heart sounds, S1 normal and S2 normal.  Pulmonary:     Breath sounds: No decreased breath sounds, wheezing, rhonchi or rales.  Abdominal:     Palpations: Abdomen is soft.     Tenderness: There is abdominal tenderness.  Musculoskeletal:     Right lower leg: No swelling.     Left lower leg: No swelling.  Skin:    General: Skin is warm.     Findings: No rash.  Neurological:     Mental Status: She is alert and oriented to person, place, and time.     Data Reviewed: Creatinine 0.65, hemoglobin 8.2, ferritin 5.7  Family Communication: Updated son on the phone  Disposition: Status is: Inpatient Remains inpatient appropriate because: Tapering off TPN and advancing diet.  Planned Discharge Destination: Home    Time spent: 27 minutes.  Case discussed with general surgery team.  Author: Loletha Grayer, MD 11/25/2021 3:02 PM  For on call review [email or web address].

## 2021-11-25 NOTE — Progress Notes (Signed)
Nutrition Follow-up  DOCUMENTATION CODES:   Not applicable  INTERVENTION:   TPN per pharmacy- plan is for 1/2 rate tonight  Ensure Enlive po TID, each supplement provides 350 kcal and 20 grams of protein.  MVI po daily   Daily weights   NUTRITION DIAGNOSIS:   Inadequate oral intake related to altered GI function as evidenced by NPO status. -resolving   GOAL:   Patient will meet greater than or equal to 90% of their needs -met with TPN  MONITOR:   PO intake, Supplement acceptance, Labs, Weight trends, Skin, I & O's, Diet advancement  ASSESSMENT:   76 y/o female with h/o HTN, HLD, hypothyroidism, GERD, anxiety, depression, DM, Barrett's esophagus with stricture, stroke, CKD and RA who is admitted with large bowel partial obstruction in the sigmoid colon secondary to diverticulitis now s/p Hartmann's procedure 10/20.  NGT removed yesterday. Pt tolerating clear liquids and ate ~50% of her breakfast. Pt advanced to full liquid diet today. Pt is tolerating TPN well; plan is to decrease to half rate tonight. RD will add supplements and MVI to help pt meet her estimated needs. Per chart, pt is up ~35lbs since admission if her bed weights are correct. Pt +6.9L on her I & Os. Pt is having bowel function. Pt did have an episode of emesis overnight but reports that she is feeling better today.   Medications reviewed and include: D3, lovenox, insulin, synthroid, melatonin, protonix  Labs reviewed: K 3.7 wnl, BUN 30(H), P 3.5 wnl, Mg 1.9 wnl Triglycerides- 149- 10/23 Hgb 8.2(L), Hct 24.9(L) Cbgs- 157, 134, 137 x 24 hrs  Drains- 198m   Diet Order:   Diet Order             Diet full liquid Room service appropriate? Yes; Fluid consistency: Thin  Diet effective now                  EDUCATION NEEDS:   Education needs have been addressed  Skin:  Skin Assessment: Reviewed RN Assessment  Last BM:  10/25- 5485mvia ostomy  Height:   Ht Readings from Last 1 Encounters:   11/20/21 _0  (1.651 m)    Weight:   Wt Readings from Last 1 Encounters:  11/25/21 88.4 kg    Ideal Body Weight:  56.8 kg  BMI:  Body mass index is 32.43 kg/m.  Estimated Nutritional Needs:   Kcal:  1800-2100kcal/day  Protein:  90-105g/day  Fluid:  1.4-1.6L/day  CaKoleen DistanceS, RD, LDN Please refer to [facility] and/or RD on-call/weekend/after hours pager

## 2021-11-25 NOTE — Progress Notes (Signed)
Mobility Specialist - Progress Note    11/25/21 1527  Mobility  Activity Ambulated with assistance in hallway  Level of Assistance Contact guard assist, steadying assist  Assistive Device Front wheel walker  Distance Ambulated (ft) 320 ft  Activity Response Tolerated well  Mobility Referral Yes  $Mobility charge 1 Mobility   [place]ie [place] Mobility Specialist 11/25/21 3:28 PM

## 2021-11-25 NOTE — Progress Notes (Signed)
PHARMACY - TOTAL PARENTERAL NUTRITION CONSULT NOTE   Indication: Small bowel obstruction  Patient Measurements: Height: '5\' 5"'$  (165.1 cm) Weight: 88.4 kg (194 lb 14.5 oz) IBW/kg (Calculated) : 57 TPN AdjBW (KG): 60.8 Body mass index is 32.43 kg/m.  Assessment: 57 YOF w/ PMH of anxiety, osteoarthritis, stage III chronic kidney disease, rheumatoid arthritis, type 2 diabetes mellitus, depression, hypertension, dyslipidemia, GERD, hypothyroidism and CVA, who presented with acute onset of abdominal pain all over with associated abdominal distention that started about 3 weeks ago.  Glucose / Insulin: sSSI q4h  BG 134-172 previous 24h (gluc <180) 13 units in last 24h Electrolytes: Mg 1.6>1.9; Other lytes WNL Renal: Scr 0.69>0.65, improved to WNL/Stable.  Hepatic: LFTs wnl --TG 154 at baseline >> 149 10/23 Intake / Output: UOP 0.35m/k/h [I/O net +5.8L] MIVF:GI Imaging: - KUB on 10/22  distended loops of small bowel c/w ileus  GI Surgeries / Procedures:   Central access: 11/21/21 (pending placement) TPN start date: 11/21/21  Nutritional Goals: Goal TPN rate is 80 mL/hr (provides 111 g of protein and 1985.5 kcals per day)  RD Assessment: Estimated Needs Total Energy Estimated Needs: 1800-2000 Total Protein Estimated Needs: 100-115 grams Total Fluid Estimated Needs: > 1.8 L  Current Nutrition:  NPO  Plan:  10/25 - Passed NGT clamping trial 10/24, & tolerated CLD >> advance to FLD today & decrease TPN to half-rate tonight. Expect wean to off in 24-48hrs per surgery.  Reduce TPN from 80>449mhr at 1800 Nutritional components Amino acids (using Travasol 10%): 111.4 grams Dextrose: 276 grams Lipids (using 20% SMOFlipids): 60 grams kCal 1985 Electrolytes in TPN (standard): Na 5031mL, K 45m37m, Ca 5mEq11m Mg 5mEq/66mand Phos 15mmol65mCl:Ac 1:1 Add standard MVI and trace elements to TPN Continue Sensitive q4h SSI and adjust as needed  Monitor TPN labs on Mon/Thurs, daily until  stable  Katelyn Simmons 11/25/2021,9:46 AM

## 2021-11-25 NOTE — Progress Notes (Signed)
[facility] Hospital Day(s): 7.   Post op day(s): 5 Days Post-Op.   Interval History:  Patient seen and examined No acute events or new complaints overnight.  Without leukocytosis; 8.2K Hgb stable to 8.2 Renal function normal; sCr - 0.65; UO - 775 ccs + unmeasured No significant electrolyte derangements Surgical drain with 80 ccs out; serosanguinous NGT removed yesterday (10/24) after passing clamp trial Colostomy with out; 545 ccs On CLD; tolerating well   Vital signs in last 24 hours: [min-max] current  Temp:  [97.5 F (36.4 C)-99.4 F (37.4 C)] 99.4 F (37.4 C) (10/25 0600) Pulse Rate:  [77-100] 77 (10/25 0600) Resp:  [16-18] 16 (10/25 0600) BP: (117-141)/(47-61) 117/47 (10/25 0600) SpO2:  [96 %-99 %] 96 % (10/25 0600) Weight:  [88.4 kg] 88.4 kg (10/25 0500)     Height: '5\' 5"'$  (165.1 cm) Weight: 88.4 kg BMI (Calculated): 32.43   Intake/Output last 2 shifts:  10/24 0701 - 10/25 0700 In: 2879.1 [P.O.:120; I.V.:2759.1] Out: 68 [Urine:775; Emesis/NG output:100; Drains:145; Stool:545]   Physical Exam:  Constitutional: alert, cooperative and no distress Respiratory: breathing non-labored at rest  Cardiovascular: regular rate and sinus rhythm  Gastrointestinal: Soft, incisional soreness, non-distended, no rebound/guarding. Colostomy in left abdomen; gas and stool in bag. Surgical drain in RLQ; output serosanguinous Integumentary: Midline wound CDI with staples and penrose   Labs:     Latest Ref Rng & Units 11/25/2021    6:00 AM 11/24/2021    6:00 AM 11/22/2021    4:44 AM  CBC  WBC 4.0 - 10.5 K/uL 8.2  7.6  8.2   Hemoglobin 12.0 - 15.0 g/dL 8.2  8.5  8.6   Hematocrit 36.0 - 46.0 % 24.9  25.3  26.1   Platelets 150 - 400 K/uL 316  332  342       Latest Ref Rng & Units 11/25/2021    6:00 AM 11/24/2021    6:00 AM 11/23/2021    5:13 AM  CMP  Glucose 70 - 99 mg/dL 147  159  150   BUN 8 - 23 mg/dL '30  28  22   '$ Creatinine 0.44  - 1.00 mg/dL 0.65  0.65  0.69   Sodium 135 - 145 mmol/L 138  137  141   Potassium 3.5 - 5.1 mmol/L 3.7  3.6  3.4   Chloride 98 - 111 mmol/L 105  104  104   CO2 22 - 32 mmol/L '28  28  30   '$ Calcium 8.9 - 10.3 mg/dL 8.9  8.6  8.7   Total Protein 6.5 - 8.1 g/dL   5.5   Total Bilirubin 0.3 - 1.2 mg/dL   0.4   Alkaline Phos 38 - 126 U/L   56   AST 15 - 41 U/L   21   ALT 0 - 44 U/L   15      Imaging studies: No new pertinent imaging studies   Assessment/Plan:  76 y.o. female with ROBF 5 Days Post-Op s/p exploratory laparotomy and Hartman's Procedure for large bowel obstruction secondary to sigmoid stricture.    - Will advance to FLD; con potentially do soft food for dinner  - We can likely go to 1/2 rate TPN for new bag tonight if tolerates diet advancement  - Monitor abdominal examination; on-going bowel function  - continue surgical drain; monitor and record output - Pain control prn; antiemetics prn   - WOC Engaged for ostomy care/teaching    -  Mobilize; therapies on board; recommending [place]  - Further management per primary service; we will follow    - Discharge Planning; Pending diet advancement and weaning from TPN; hopefully in next 24-48 hours  All of the above findings and recommendations were discussed with the patient, and the medical team, and all of patient's questions were answered to her expressed satisfaction.  -- Edison Simon, PA-C [facility] 11/25/2021, 7:19 AM M-F: 7am - 4pm

## 2021-11-25 NOTE — Assessment & Plan Note (Signed)
Replaced. °

## 2021-11-25 NOTE — Assessment & Plan Note (Signed)
BMI 32.43 with current height and weight in computer.

## 2021-11-25 NOTE — Assessment & Plan Note (Addendum)
Will improve with advancing diet.

## 2021-11-25 NOTE — Progress Notes (Signed)
Physical Therapy Treatment Patient Details Name: Katelyn Simmons MRN: [id number] DOB: 13-Apr-1945 Today's Date: 11/25/2021   History of Present Illness Pt admitted for large bowel obstructions s/p colectomy and colostomy on 11/20/21. History includes anxiety, OA, CKD, RA, DM, depression, HTN, GERD, CVA. INitial complaints of abdominal pain with N/V.    PT Comments    Patient received up in recliner. She is agreeable to ambulation. Reports she has already walked 2 x this morning. No assistance provided with transfer or gait. Demonstrates good safety awareness and mod I. Patient does not require continued skilled PT at this time. She can continue ambulating with nursing staff, mobility specialist. Signing off.      Recommendations for follow up therapy are one component of a multi-disciplinary discharge planning process, led by the attending physician.  Recommendations may be updated based on patient status, additional functional criteria and insurance authorization.  Follow Up Recommendations  No PT follow up     Assistance Recommended at Discharge PRN  Patient can return home with the following Assist for transportation   Equipment Recommendations  None recommended by PT    Recommendations for Other Services       Precautions / Restrictions Precautions Precaution Comments: mod fall Restrictions Weight Bearing Restrictions: No     Mobility  Bed Mobility               General bed mobility comments: not assessed patient in recliner upon entry    Transfers Overall transfer level: Independent Equipment used: Rolling walker (2 wheels) Transfers: Sit to/from Stand Sit to Stand: Supervision                Ambulation/Gait Ambulation/Gait assistance: Modified independent (Device/Increase time) Gait Distance (Feet): 300 Feet Assistive device: Rolling walker (2 wheels) Gait Pattern/deviations: WFL(Within Functional Limits), Step-through pattern Gait velocity:  slowed     General Gait Details: ambulated 2x around nursing station with RW. No assist needed. No lob   Stairs             Wheelchair Mobility    Modified Rankin (Stroke Patients Only)       Balance Overall balance assessment: Modified Independent Sitting-balance support: Feet supported Sitting balance-Leahy Scale: Normal     Standing balance support: Bilateral upper extremity supported, During functional activity Standing balance-Leahy Scale: Normal                              Cognition Arousal/Alertness: Awake/alert Behavior During Therapy: WFL for tasks assessed/performed Overall Cognitive Status: Within Functional Limits for tasks assessed                                          Exercises      General Comments        Pertinent Vitals/Pain Pain Assessment Pain Assessment: No/denies pain    Home Living                          Prior Function            PT Goals (current goals can now be found in the care plan section) Acute Rehab PT Goals Patient Stated Goal: to get out of the hospital PT Goal Formulation: With patient Time For Goal Achievement: 12/07/21 Potential to Achieve Goals: Good Progress towards PT goals: Goals met/education  completed, patient discharged from PT    Frequency    Min 2X/week      PT Plan Current plan remains appropriate    Co-evaluation              AM-PAC PT "6 Clicks" Mobility   Outcome Measure  Help needed turning from your back to your side while in a flat bed without using bedrails?: None Help needed moving from lying on your back to sitting on the side of a flat bed without using bedrails?: None Help needed moving to and from a bed to a chair (including a wheelchair)?: None Help needed standing up from a chair using your arms (e.g., wheelchair or bedside chair)?: None Help needed to walk in hospital room?: None Help needed climbing 3-5 steps with a  railing? : A Little 6 Click Score: 23    End of Session   Activity Tolerance: Patient tolerated treatment well Patient left: in chair;with call bell/phone within reach Nurse Communication: Mobility status PT Visit Diagnosis: Muscle weakness (generalized) (M62.81)     Time: 5183-4373 PT Time Calculation (min) (ACUTE ONLY): 22 min  Charges:  $Gait Training: 8-22 mins                     Perla Echavarria, PT, GCS 11/25/21,11:42 AM

## 2021-11-25 NOTE — Consult Note (Signed)
[place] Nurse ostomy follow up Stoma type/location: LUQ, colostomy Stomal assessment/size: 1 3/4" budded, pink Peristomal assessment: NA Treatment options for stomal/peristomal skin: 2" skin barrier ring Output; liquid stool  Ostomy pouching: 2pc. 2 3/4" with 2" skin barrier ring Education provided:  Scheduled to meet with patient and sister this am at 29, however pt's sister had a medical issue yesterday and has requested to meet with me in the am 10/26.  Pouch intact, encouraged patient to start emptying with NT/RN assist and notified staff of same.  No other questions from the patient today Extra supplies gathered and placed in the patient's room pending DC tomorrow after teaching per patient Left [facility] catalog and ostomy clinic information will review this with patient tomorrow.  Enrolled patient in [place] Start Discharge program: Yes  Patient would benefit from [facility] for support with ostomy care and further teaching, she lives alone per patient report this am.   [place] Nurse will follow along with you for continued support with ostomy teaching and care [place] MSN, [place], [place], [place], [place]

## 2021-11-25 NOTE — Assessment & Plan Note (Signed)
Sodium in the normal range now

## 2021-11-26 DIAGNOSIS — D509 Iron deficiency anemia, unspecified: Secondary | ICD-10-CM | POA: Diagnosis not present

## 2021-11-26 DIAGNOSIS — K56609 Unspecified intestinal obstruction, unspecified as to partial versus complete obstruction: Secondary | ICD-10-CM | POA: Diagnosis not present

## 2021-11-26 DIAGNOSIS — E669 Obesity, unspecified: Secondary | ICD-10-CM

## 2021-11-26 DIAGNOSIS — E876 Hypokalemia: Secondary | ICD-10-CM | POA: Diagnosis not present

## 2021-11-26 LAB — COMPREHENSIVE METABOLIC PANEL
ALT: 59 U/L — ABNORMAL HIGH (ref 0–44)
AST: 73 U/L — ABNORMAL HIGH (ref 15–41)
Albumin: 2 g/dL — ABNORMAL LOW (ref 3.5–5.0)
Alkaline Phosphatase: 57 U/L (ref 38–126)
Anion gap: 6 (ref 5–15)
BUN: 28 mg/dL — ABNORMAL HIGH (ref 8–23)
CO2: 26 mmol/L (ref 22–32)
Calcium: 8.8 mg/dL — ABNORMAL LOW (ref 8.9–10.3)
Chloride: 106 mmol/L (ref 98–111)
Creatinine, Ser: 0.67 mg/dL (ref 0.44–1.00)
GFR, Estimated: >60 mL/min (ref >60)
Glucose, Bld: 135 mg/dL — ABNORMAL HIGH (ref 70–99)
Potassium: 3.4 mmol/L — ABNORMAL LOW (ref 3.5–5.1)
Sodium: 138 mmol/L (ref 135–145)
Total Bilirubin: 0.3 mg/dL (ref 0.3–1.2)
Total Protein: 5.2 g/dL — ABNORMAL LOW (ref 6.5–8.1)

## 2021-11-26 LAB — GLUCOSE, CAPILLARY
Glucose-Capillary: 111 mg/dL — ABNORMAL HIGH (ref 70–99)
Glucose-Capillary: 129 mg/dL — ABNORMAL HIGH (ref 70–99)
Glucose-Capillary: 114 mg/dL — ABNORMAL HIGH (ref 70–99)
Glucose-Capillary: 111 mg/dL — ABNORMAL HIGH (ref 70–99)

## 2021-11-26 LAB — MAGNESIUM: Magnesium: 1.6 mg/dL — ABNORMAL LOW (ref 1.7–2.4)

## 2021-11-26 LAB — TRIGLYCERIDES: Triglycerides: 235 mg/dL — ABNORMAL HIGH (ref <150)

## 2021-11-26 LAB — PHOSPHORUS: Phosphorus: 3.9 mg/dL (ref 2.5–4.6)

## 2021-11-26 MED ORDER — ACETAMINOPHEN 500 MG PO TABS: 1000.0000 mg | ORAL_TABLET | Freq: Four times a day (QID) | ORAL | 0 refills | Status: DC | PRN

## 2021-11-26 MED ORDER — ONDANSETRON HCL 4 MG PO TABS: 4.0000 mg | ORAL_TABLET | Freq: Four times a day (QID) | ORAL | 0 refills | Status: DC | PRN

## 2021-11-26 MED ORDER — OXYCODONE HCL 5 MG PO TABS: 5.0000 mg | ORAL_TABLET | Freq: Four times a day (QID) | ORAL | 0 refills | Status: DC | PRN

## 2021-11-26 MED ORDER — GLUCERNA SHAKE PO LIQD: 237.0000 mL | Freq: Three times a day (TID) | ORAL | 0 refills | Status: DC

## 2021-11-26 MED ORDER — POTASSIUM CHLORIDE CRYS ER 20 MEQ PO TBCR
40.0000 meq | EXTENDED_RELEASE_TABLET | Freq: Once | ORAL | Status: AC
  Administered 2021-11-26: 40 meq via ORAL
  Filled 2021-11-26: qty 2

## 2021-11-26 MED ORDER — MAGNESIUM SULFATE 2 GM/50ML IV SOLN
2.0000 g | Freq: Once | INTRAVENOUS | Status: AC
  Administered 2021-11-26: 2 g via INTRAVENOUS
  Filled 2021-11-26: qty 50

## 2021-11-26 NOTE — Progress Notes (Signed)
   11/26/21 0900  Clinical Encounter Type  Visited With Patient and family together  Visit Type Initial  Referral From Nurse  Consult/Referral To Chaplain   Chaplain responded to [facility] consult to provide information on HCPOA. Patient already has HCPOA but wanted to know if it covered home care. Chaplain explained that the HCPOA does not address those questions.

## 2021-11-26 NOTE — TOC Transition Note (Signed)
Transition of Care [facility]) - CM/SW Discharge Note   Patient Details  Name: Katelyn Simmons MRN: [id number] Date of Birth: Oct 31, 1945  Transition of Care [facility]) CM/SW Contact:  Beverly Sessions, RN Phone Number: 11/26/2021, 9:55 AM   Clinical Narrative:      Patient to discharge today [name] with [facility] notified of discharge [facility] with adapt notified for RW to be delivered bedside.  Requested bedside RN to send patient with some ostomy supplies at discharge    Barriers to Discharge: Continued Medical Work up   Patient Goals and CMS Choice Patient states their goals for this hospitalization and ongoing recovery are:: To return home CMS Medicare.gov Compare Post Acute Care list provided to:: Patient Choice offered to / list presented to : Patient  Discharge Placement                       Discharge Plan and Services     Post Acute Care Choice: Home Health                    HH Arranged: RN, PT, OT [facility] Agency: [place] Date [place]: 11/24/21   Representative spoke with at [place]: [place] ([place]) Interventions     Readmission Risk Interventions    11/24/2021   11:07 AM  Readmission Risk Prevention Plan  Transportation Screening Complete  Medication Review Press photographer) Complete  HRI or [place] Complete  SW Recovery Care/Counseling Consult Complete  Palliative Care Screening Not [place] Not Applicable

## 2021-11-26 NOTE — Assessment & Plan Note (Signed)
Last hemoglobin 8.2.  Likely dilutional from IV fluids and TPN being given during the entire hospital course.  Recommend checking hemoglobin as outpatient.

## 2021-11-26 NOTE — Progress Notes (Signed)
Mobility Specialist - Progress Note   11/26/21 1128  Mobility  Activity Ambulated independently in hallway  Level of Assistance Modified independent, requires aide device or extra time  Assistive Device Front wheel walker  Distance Ambulated (ft) 320 ft  Activity Response Tolerated well  Mobility Referral Yes  $Mobility charge 1 Mobility   Pt sitting EOB upon entry, utilizing RA. Pt STS to RW ModI, extra time given. Pt ambulated two laps around the NS indep, tolerated well. Pt denied SOB, dizziness and fatigue. Pt returned to room, left in bathroom.   [place]ie [place] Mobility Specialist 11/26/21 11:31 AM

## 2021-11-26 NOTE — Discharge Instructions (Signed)
In addition to included general post-operative instructions,  Diet: Resume home diet.   Activity: No heavy lifting >20 pounds (children, pets, laundry, garbage) or strenuous activity for 6 weeks from date of surgery, but light activity and walking are encouraged. Do not drive or drink alcohol if taking narcotic pain medications or having pain that might distract from driving.  Wound care: Keep midline wound covered with dry gauze and tape; there may be drainage from this. You can remove dressing to take a short shower or sponge bath. Keep drain site covered. We will remove staples at first follow up appointment.   Drain: Monitor and record drain output daily. You were provided a handout for this. Please bring this with you to your follow up appointment. We will plan to remove drain in follow up.   Medications: Resume all home medications. For mild to moderate pain: acetaminophen (Tylenol) or ibuprofen/naproxen (if no kidney disease). Combining Tylenol with alcohol can substantially increase your risk of causing liver disease. Narcotic pain medications, if prescribed, can be used for severe pain, though may cause nausea, constipation, and drowsiness. Do not combine Tylenol and Percocet (or similar) within a 6 hour period as Percocet (and similar) contain(s) Tylenol. If you do not need the narcotic pain medication, you do not need to fill the prescription.  Call office [phone]) at any time if any questions, worsening pain, fevers/chills, bleeding, drainage from incision site, or other concerns.

## 2021-11-26 NOTE — Consult Note (Signed)
[place] Nurse ostomy follow up Stoma type/location: LLQ, end colostomy  Stomal assessment/size: 1 5/8" slightly oval shaped, pink, moist, budded  Peristomal assessment: intact  Treatment options for stomal/peristomal skin: 2" skin barrier ring Output liquid stool  Ostomy pouching: 2pc. 2 3/4" with 2" skin barrier ring  Education provided:  Demonstrated pouch change (cutting new skin barrier, measuring stoma, cleaning peristomal skin and stoma, use of barrier ring) Allowed patient to cut new skin barrier, had patient and sister practice open and closing and discussed using wick to clean spout.  Allowed patient and sister to attach pouch to skin barrier ; patient was not able to do this, we discussed attaching pouch prior to placement, they will consider this option at home  Education on emptying when 1/3 to 1/2 full and how to empty Demonstrated "burping" flatus from pouch Discussed bathing, diet, gas, medication use, constipation Discussed risk of peristomal hernia Provided patient with [facility] and marked items currently using Answered patient/family questions:  discussed ostomy outpatient clinic, DME ordering; follow up with surgeon   Enrolled patient in [employer] Discharge program: Yes 5 pouches/skin barriers and barrier rings in the patient's room. One prepped for next pouch change.   [place], [place], [place] 

## 2021-11-26 NOTE — Progress Notes (Signed)
   11/26/21 1400  Clinical Encounter Type  Visited With Patient  Visit Type Follow-up   Chaplain provided follow up visit.

## 2021-11-26 NOTE — Progress Notes (Signed)
[facility] Hospital Day(s): 8.   Post op day(s): 6 Days Post-Op.   Interval History:  Patient seen and examined No acute events or new complaints overnight.  Patient reports she is feeling good No fever, chills, nausea, emesis Labs this morning are still pending  Surgical drain with 60 ccs out; serosanguinous Colostomy with out; 900 ccs On soft diet; tolerating well  Weaning from TPN  Vital signs in last 24 hours: [min-max] current  Temp:  [97.8 F (36.6 C)-98.9 F (37.2 C)] 97.8 F (36.6 C) (10/26 0425) Pulse Rate:  [74-90] 74 (10/26 0425) Resp:  [18] 18 (10/26 0425) BP: (115-141)/(43-49) 115/43 (10/26 0425) SpO2:  [97 %-100 %] 98 % (10/26 0425)     Height: '5\' 5"'$  (165.1 cm) Weight: 88.4 kg BMI (Calculated): 32.43   Intake/Output last 2 shifts:  10/25 0701 - 10/26 0700 In: 1800.6 [P.O.:480; I.V.:1320.6] Out: 1410 [Urine:450; Drains:60; Stool:900]   Physical Exam:  Constitutional: alert, cooperative and no distress Respiratory: breathing non-labored at rest  Cardiovascular: regular rate and sinus rhythm  Gastrointestinal: Soft, incisional soreness, non-distended, no rebound/guarding. Colostomy in left abdomen; gas and stool in bag. Surgical drain in RLQ; output serosanguinous Integumentary: Midline wound CDI with staples and penrose   Labs:     Latest Ref Rng & Units 11/25/2021    6:00 AM 11/24/2021    6:00 AM 11/22/2021    4:44 AM  CBC  WBC 4.0 - 10.5 K/uL 8.2  7.6  8.2   Hemoglobin 12.0 - 15.0 g/dL 8.2  8.5  8.6   Hematocrit 36.0 - 46.0 % 24.9  25.3  26.1   Platelets 150 - 400 K/uL 316  332  342       Latest Ref Rng & Units 11/25/2021    6:00 AM 11/24/2021    6:00 AM 11/23/2021    5:13 AM  CMP  Glucose 70 - 99 mg/dL 147  159  150   BUN 8 - 23 mg/dL '30  28  22   '$ Creatinine 0.44 - 1.00 mg/dL 0.65  0.65  0.69   Sodium 135 - 145 mmol/L 138  137  141   Potassium 3.5 - 5.1 mmol/L 3.7  3.6  3.4   Chloride 98 - 111  mmol/L 105  104  104   CO2 22 - 32 mmol/L '28  28  30   '$ Calcium 8.9 - 10.3 mg/dL 8.9  8.6  8.7   Total Protein 6.5 - 8.1 g/dL   5.5   Total Bilirubin 0.3 - 1.2 mg/dL   0.4   Alkaline Phos 38 - 126 U/L   56   AST 15 - 41 U/L   21   ALT 0 - 44 U/L   15      Imaging studies: No new pertinent imaging studies   Assessment/Plan:  76 y.o. female 6 Days Post-Op s/p exploratory laparotomy and Hartman's Procedure for large bowel obstruction secondary to sigmoid stricture.    - Continue soft/regular diet   - Discontinue TPN  - Monitor abdominal examination; on-going bowel function  - Continue surgical drain; monitor and record output. She will discharge home with this  - Pain control prn; antiemetics prn   - WOC Engaged for ostomy care/teaching    - Mobilize; therapies on board; recommending [place]  - Further management per primary service; we will follow   - Reviewed pathology    - Discharge Planning; Stable for discharge form surgical perspective, she will  go home with drain. I will update DC instructions and will plan for follow up in 1 week for drain and staple removal   All of the above findings and recommendations were discussed with the patient, and the medical team, and all of patient's questions were answered to her expressed satisfaction.  -- Edison Simon, PA-C [facility] 11/26/2021, 7:20 AM M-F: 7am - 4pm

## 2021-11-26 NOTE — Progress Notes (Addendum)
PHARMACY - TOTAL PARENTERAL NUTRITION CONSULT NOTE   Indication: Small bowel obstruction  Patient Measurements: Height: '5\' 5"'$  (165.1 cm) Weight: 88.4 kg (194 lb 14.5 oz) IBW/kg (Calculated) : 57 TPN AdjBW (KG): 60.8 Body mass index is 32.43 kg/m.  Assessment: 81 YOF w/ PMH of anxiety, osteoarthritis, stage III chronic kidney disease, rheumatoid arthritis, type 2 diabetes mellitus, depression, hypertension, dyslipidemia, GERD, hypothyroidism and CVA, who presented with acute onset of abdominal pain all over with associated abdominal distention that started about 3 weeks ago.  Glucose / Insulin: sSSI q4h  BG 111-166 previous 24h (gluc <180) 7 units in last 24h Electrolytes: K3.4; Mg 1.6; Other lytes WNL Renal: Scr 0.69>0.65, improved to WNL/Stable.  Hepatic: LFTs wnl --TG 154 at baseline >> 149 10/23 >> 235 on 10/26 (TPN stopping this evening) Intake / Output: UOP 0.60m/k/h [I/O net +5.8L] MIVF:GI Imaging: - KUB on 10/22  distended loops of small bowel c/w ileus  GI Surgeries / Procedures:   Central access: 11/21/21 (pending placement) TPN start date: 11/21/21  Nutritional Goals: Goal TPN rate is 80 mL/hr (provides 111 g of protein and 1985.5 kcals per day)  RD Assessment: Estimated Needs Total Energy Estimated Needs: 1800-2100kcal/day Total Protein Estimated Needs: 90-105g/day Total Fluid Estimated Needs: 1.4-1.6L/day  Current Nutrition:  Soft diet  Plan:  10/25 - Passed NGT clamping trial 10/24, & tolerated CLD >> advance to FLD 10/25 & decreased TPN to half-rate. Wean to off in 24-48hrs per surgery.  10/26 - Wean completing tonight per surgery. Pt tolerating soft diet. Will replace electrolytes x1 per ending TPN consult then sign off. Removed recurring labs. Last labs entered for AM 10/27.  K 3.4 - replace with KCL 40 meq PO x1 Mg 1.6 - replace with MgSO 2g IV x1 Complete wean, with reduction of TPN from 432mhr > OFF at 1800 Nutritional components Amino acids  (using Travasol 10%): 111.4 grams Dextrose: 276 grams Lipids (using 20% SMOFlipids): 60 grams kCal 1985 Electrolytes in TPN (standard): Na 5043mL, K 23m62m, Ca 5mEq55m Mg 5mEq/76mand Phos 15mmol32mCl:Ac 1:1 Add standard MVI and trace elements to TPN Continue Sensitive q4h SSI and adjust as needed  Monitor TPN labs on Mon/Thurs, daily until stable  BrandonLorna Dibble2023,8:22 AM

## 2021-11-26 NOTE — Progress Notes (Signed)
Mobility Specialist - Progress Note   11/26/21 1153  Mobility  Activity Ambulated independently in room  Level of Assistance Modified independent, requires aide device or extra time  Assistive Device Front wheel walker  Distance Ambulated (ft) 8 ft  Activity Response Tolerated well  Mobility Referral Yes  $Mobility charge 1 Mobility   Pt sitting in bathroom upon entry, utilizing RA. Pt ambulated from the bathroom to the recliner using [place]. Pt left sitting in recliner with needs within reach, no complaints.   Candie Mile Mobility Specialist 11/26/21 11:55 AM

## 2021-11-26 NOTE — Plan of Care (Signed)
PICC line removed, discharged instructions reviewed with patient and family, patient discharged to home with son

## 2021-11-26 NOTE — Discharge Summary (Signed)
Physician Discharge Summary   Patient: Katelyn Simmons MRN: [id number] DOB: 1946/01/09  Admit date:     11/18/2021  Discharge date: 11/26/21  Discharge Physician: Loletha Grayer   PCP: Idelle Crouch, MD   Recommendations at discharge:   Follow-up general surgery 1 week to remove staples and drain. Home health ostomy nurse Follow-up PCP  Discharge Diagnoses: Principal Problem:   Large bowel obstruction (HCC) Active Problems:   Hypomagnesemia   Hypokalemia   GERD without esophagitis   Dyslipidemia   Hypothyroidism   Essential hypertension   Anxiety and depression   Controlled type 2 diabetes mellitus without complication, without long-term current use of insulin (HCC)   Hyponatremia   Hypophosphatemia   Obesity (BMI 30-39.9)   Iron deficiency anemia    Hospital Course: Katelyn Simmons is a 76 y.o. Caucasian female with medical history significant for anxiety, osteoarthritis, stage III chronic kidney disease, rheumatoid arthritis, type 2 diabetes mellitus, depression, hypertension, dyslipidemia, GERD, hypothyroidism and CVA, who presented to the emergency room with acute onset of abdominal pain all over with associated abdominal distention that started about 3 weeks ago.  Abdominal pain worsened for the last 3 days, she also started having nausea vomiting.  However, she still had a several loose stools yesterday. CT scan showed colon obstruction at the sigmoid level.  Patient was placed on IV fluids, n.p.o. and NG suction.  General surgery consult obtained.  Colectomy and colostomy performed on 10/20.  The patient was placed on TPN.  Diet was slowly advanced.  Now tolerating solid food.  Patient having output out of her ostomy.  Cleared by general surgery to go home on 11/26/2021  Assessment and Plan: * Large bowel obstruction (HCC) Postoperative day 6.  Having output out of the ostomy.  Ambulating.  Tolerating advance diet. Cleared by surgery to go home.  Home health ostomy  nurse.  Surgery will follow-up next week to remove staples and drain.  Hypomagnesemia Will improve with advancing diet.  Hypokalemia Will improve with advancing diet.  Dyslipidemia Continue Crestor  Hypothyroidism Continue Synthroid and Cytomel.  Iron deficiency anemia Last hemoglobin 8.2.  Likely dilutional from IV fluids and TPN being given during the entire hospital course.  Recommend checking hemoglobin as outpatient.  Obesity (BMI 30-39.9) BMI 32.43 with current height and weight in computer.  Hypophosphatemia Replaced  Hyponatremia Sodium in the normal range now  Controlled type 2 diabetes mellitus without complication, without long-term current use of insulin (HCC) Last hemoglobin A1c 5.7.  Anxiety and depression - We will continue BuSpar and Effexor XR as well as Seroquel  Essential hypertension Blood pressure stable off medications.         Consultants: General surgery Procedures performed: Exploratory laparotomy and Hartman's procedure for large bowel obstruction secondary to sigmoid stricture. Disposition: Home health Diet recommendation:  Cardiac diet DISCHARGE MEDICATION: Allergies as of 11/26/2021       Reactions   Sulfa Antibiotics Swelling   Swelling of face   Eggs Or Egg-derived Products Nausea And Vomiting   Methocarbamol Other (See Comments)   Lips felt swollen, but were not   Parafon Forte Dsc [chlorzoxazone] Other (See Comments)   Unknown reaction. Patient does not remember taking this medication   Tizanidine Other (See Comments)   Codeine Other (See Comments)   Jittery         Medication List     STOP taking these medications    amoxicillin-clavulanate 875-125 MG tablet Commonly known as: AUGMENTIN   aspirin  EC 81 MG tablet   clopidogrel 75 MG tablet Commonly known as: PLAVIX   folic acid 1 MG tablet Commonly known as: FOLVITE   hydrochlorothiazide 25 MG tablet Commonly known as: HYDRODIURIL   Lancets Misc. Kit    levocetirizine 5 MG tablet Commonly known as: XYZAL   liraglutide 18 MG/3ML Sopn Commonly known as: VICTOZA   methotrexate 2.5 MG tablet Commonly known as: RHEUMATREX   phentermine 37.5 MG capsule   Rybelsus 7 MG Tabs Generic drug: Semaglutide   senna-docusate 8.6-50 MG tablet Commonly known as: Senokot-S       TAKE these medications    acetaminophen 500 MG tablet Commonly known as: TYLENOL Take 2 tablets (1,000 mg total) by mouth every 6 (six) hours as needed for mild pain or fever.   beta carotene w/minerals tablet Take 1 tablet by mouth daily.   busPIRone 10 MG tablet Commonly known as: BUSPAR Take 10 mg by mouth 2 (two) times daily.   Cholecalciferol 25 MCG (1000 UT) capsule Take 1,000 Units by mouth every evening.   cyanocobalamin 1000 MCG tablet Commonly known as: VITAMIN B12 Take 1,000 mcg by mouth daily.   feeding supplement (GLUCERNA SHAKE) Liqd Take 237 mLs by mouth 3 (three) times daily between meals.   fenofibrate 160 MG tablet Take 160 mg by mouth at bedtime.   glucose blood test strip 1 each by Other route as needed for other. Use as instructed   [employer] by Does not apply route.   levothyroxine 125 MCG tablet Commonly known as: SYNTHROID Take 125 mcg by mouth daily before breakfast.   liothyronine 5 MCG tablet Commonly known as: CYTOMEL Take 5 mcg by mouth daily.   LUTEIN 20 PO Take 20 mg by mouth daily.   metoprolol succinate 100 MG 24 hr tablet Commonly known as: TOPROL-XL Take 100 mg by mouth daily. Take with or immediately following a meal.   ondansetron 4 MG tablet Commonly known as: ZOFRAN Take 1 tablet (4 mg total) by mouth every 6 (six) hours as needed for nausea. What changed:  when to take this reasons to take this   oxyCODONE 5 MG immediate release tablet Commonly known as: Oxy IR/ROXICODONE Take 1 tablet (5 mg total) by mouth every 6 (six) hours as needed for severe pain.   pantoprazole 40 MG  tablet Commonly known as: PROTONIX Take 40 mg by mouth daily.   pioglitazone 45 MG tablet Commonly known as: ACTOS Take 45 mg by mouth daily.   QUEtiapine 50 MG tablet Commonly known as: SEROQUEL Take 50 mg by mouth at bedtime.   rosuvastatin 10 MG tablet Commonly known as: CRESTOR Take 10 mg by mouth at bedtime.   venlafaxine XR 150 MG 24 hr capsule Commonly known as: EFFEXOR-XR Take 150 mg by mouth daily with breakfast. What changed: Another medication with the same name was removed. Continue taking this medication, and follow the directions you see here.               Durable Medical Equipment  (From admission, onward)           Start     Ordered   11/24/21 1111  For home use only DME Bedside commode  Once       Question:  Patient needs a bedside commode to treat with the following condition  Answer:  Weakness   11/24/21 1111            Follow-up Information  Olean Ree, MD. Daphane Shepherd on 12/02/2021.   Specialty: General Surgery Why: 12/02/2021 9:45 AM  s/p hartmans; has staples and drain Contact information: [address] [place] [address] [phone]         Idelle Crouch, MD. Go in 5 day(s).   Specialty: Internal Medicine Why: 12/02/21 2PM Contact information: [place] [address] [phone]                Discharge Exam: Filed Weights   11/19/21 0138 11/20/21 1217 11/25/21 0500  Weight: 72.3 kg 72.3 kg 88.4 kg   Physical Exam HENT:     Head: Normocephalic.     Mouth/Throat:     Pharynx: No oropharyngeal exudate.  Eyes:     General: Lids are normal.     Conjunctiva/sclera: Conjunctivae normal.  Cardiovascular:     Rate and Rhythm: Normal rate and regular rhythm.     Heart sounds: Normal heart sounds, S1 normal and S2 normal.  Pulmonary:     Breath sounds: No decreased breath sounds, wheezing, rhonchi or rales.  Abdominal:     Palpations: Abdomen is soft.     Tenderness:  There is abdominal tenderness.  Musculoskeletal:     Right lower leg: No swelling.     Left lower leg: No swelling.  Skin:    General: Skin is warm.     Findings: No rash.  Neurological:     Mental Status: She is alert and oriented to person, place, and time.      Condition at discharge: stable  The results of significant diagnostics from this hospitalization (including imaging, microbiology, ancillary and laboratory) are listed below for reference.   Imaging Studies: DG ABD ACUTE 2+V W 1V CHEST  Result Date: 11/22/2021 CLINICAL DATA:  Ileus following Hartmann's procedure EXAM: DG ABDOMEN ACUTE WITH 1 VIEW CHEST COMPARISON:  CT abdomen and pelvis dated 11/19/2021, abdominal radiograph dated 11/19/2021 FINDINGS: Lines/tubes: Enteric tube tip projects over the stomach. Right upper extremity PICC tip is not well seen, however the catheter reaches the level of the superior cavoatrial junction. Radiopaque drain projects over the pelvis. An additional more lucent drain projects over the left lower quadrant. Ostomy is seen more laterally within the left lower quadrant. Chest: Low lung volumes. Left basilar linear opacities. Blunting of the bilateral costophrenic angles. No pneumothorax. Normal heart size. Abdomen: Diffuse small bowel dilation. Postsurgical changes of the left lower quadrant. Right upper quadrant cholecystectomy clips. No radiographic finding of pneumatosis or free air. Bones: No acute osseous abnormality. IMPRESSION: 1. Diffuse small bowel dilation, likely ileus. 2. Low lung volumes with left basilar atelectasis. 3. Blunting of the bilateral costophrenic angles, likely related to small pleural effusions. 4.  Support apparatus as described. Electronically Signed   By: Darrin Nipper M.D.   On: 11/22/2021 08:34   [place] EKG SITE RITE  Result Date: 11/19/2021 If Site Rite image not attached, placement could not be confirmed due to current cardiac rhythm.  DG Abdomen 1 View  Result Date:  11/19/2021 CLINICAL DATA:  NG tube placement EXAM: ABDOMEN - 1 VIEW COMPARISON:  CT 11/18/2021 FINDINGS: Esophageal tube tip overlies the stomach, side-port in the region of GE junction. Dilated bowel in the upper abdomen IMPRESSION: Esophageal side-port is in the region of the GE junction. Consider further advancement for more optimal positioning Electronically Signed   By: Donavan Foil M.D.   On: 11/19/2021 00:18   CT Abdomen Pelvis W Contrast  Result Date: 11/18/2021  CLINICAL DATA:  Abdomen pain EXAM: CT ABDOMEN AND PELVIS WITH CONTRAST TECHNIQUE: Multidetector CT imaging of the abdomen and pelvis was performed using the standard protocol following bolus administration of intravenous contrast. RADIATION DOSE REDUCTION: This exam was performed according to the departmental dose-optimization program which includes automated exposure control, adjustment of the mA and/or kV according to patient size and/or use of iterative reconstruction technique. CONTRAST:  170m OMNIPAQUE IOHEXOL 300 MG/ML  SOLN COMPARISON:  CT 11/17/2021, 01/26/2021 FINDINGS: Lower chest: Lung bases demonstrate no acute airspace disease. Borderline cardiomegaly. Coronary vascular calcification. Small hiatal hernia Hepatobiliary: Status post cholecystectomy. No focal hepatic abnormality. Mild intra and extrahepatic biliary dilatation, common bile duct measures up to 10 mm. Pancreas: Atrophic.  No inflammation. Spleen: Normal in size without focal abnormality. Adrenals/Urinary Tract: Adrenal glands are normal. Subcentimeter hypodense renal lesions too small to further characterize. No specific imaging follow-up recommended. No hydronephrosis. The bladder contains high density contrast. Stomach/Bowel: The stomach shows mild to moderate fluid distension. There is interval development of fluid-filled mildly dilated distal small bowel. There is persistent moderate fluid distension of the colon with caliber change at the sigmoid colon, series 2,  image 69. The distal sigmoid and rectosigmoid colon are decompressed. Peripheral eyes to gas at the cecum appears generally similar. Vascular/Lymphatic: Moderate aortic atherosclerosis. No aneurysm. No suspicious lymph nodes Reproductive: Status post hysterectomy. No adnexal masses. Other: Negative for free air or free fluid Musculoskeletal: Grade 1 anterolisthesis L4 on L5 with degenerative changes. No acute osseous abnormality IMPRESSION: 1. Findings consistent with distal colon obstruction at the level of sigmoid colon either due to stricture or colon neoplasm. There is interval development of dilated fluid-filled distal small bowel, consistent with worsening obstruction. Peripheral gas at the cecum and ascending colon indeterminate for pneumatosis or peripheral intraluminal air, this is grossly unchanged. There is no evidence for perforation. 2. Status post cholecystectomy with mild intra and extrahepatic biliary dilatation. This may be correlated with LFTs Electronically Signed   By: KDonavan FoilM.D.   On: 11/18/2021 21:56   DG Chest 2 View  Result Date: 11/17/2021 CLINICAL DATA:  Infection; intermittent abdominal pain EXAM: CHEST - 2 VIEW COMPARISON:  None Available. FINDINGS: The heart size and mediastinal contours are within normal limits. Aortic atherosclerotic calcification. Bibasilar atelectasis. No pleural effusion or pneumothorax. No acute osseous abnormality. Degenerative arthritis both shoulders. IMPRESSION: No active cardiopulmonary disease. Electronically Signed   By: TPlacido SouM.D.   On: 11/17/2021 19:32   CT ABDOMEN PELVIS W CONTRAST  Result Date: 11/17/2021 CLINICAL DATA:  Bloating.  Nausea and constipation. EXAM: CT ABDOMEN AND PELVIS WITH CONTRAST TECHNIQUE: Multidetector CT imaging of the abdomen and pelvis was performed using the standard protocol following bolus administration of intravenous contrast. RADIATION DOSE REDUCTION: This exam was performed according to the  departmental dose-optimization program which includes automated exposure control, adjustment of the mA and/or kV according to patient size and/or use of iterative reconstruction technique. CONTRAST:  106mOMNIPAQUE IOHEXOL 300 MG/ML  SOLN COMPARISON:  01/26/2021 CT stone study. FINDINGS: Lower chest: Clear lung bases. Mild cardiomegaly with multivessel coronary artery calcification. Hepatobiliary: Normal liver. Cholecystectomy, without biliary ductal dilatation. Pancreas: Fatty replacement involving the head and uncinate process. No duct dilatation or acute inflammation. Spleen: Normal in size, without focal abnormality. Adrenals/Urinary Tract: Normal right adrenal gland. Mild left adrenal thickening. Mild renal cortical thinning bilaterally. Bilateral too small to characterize renal lesions are most likely cysts . In the absence of clinically indicated signs/symptoms require(s) no  independent follow-up. No hydronephrosis. Normal urinary bladder. Stomach/Bowel: Proximal gastric underdistention. Surgical sutures within the transverse duodenum. Otherwise normal small bowel. Wall thickening, accentuated by underdistention involving the sigmoid including on 67/2. The immediately upstream colon becomes dilated and fluid-filled including at up to 5.9 cm in the ascending segment. Subtle pericolonic edema is identified about the descending colon, including on 38/2. Probable small volume cecal pneumatosis, as evidenced by dependent gas on 61/2 and gas within the colonic wall on 60/2. Normal terminal ileum and appendix. Vascular/Lymphatic: Advanced aortic and branch vessel atherosclerosis. Patent mesenteric vessels. No abdominopelvic adenopathy. Reproductive: Hysterectomy.  No adnexal mass. Other: No significant free fluid. Moderate pelvic floor laxity. No free intraperitoneal air. Musculoskeletal: Grade 2 L4-5 anterolisthesis.  Osteopenia. IMPRESSION: 1. Partial colonic obstruction at the level of the sigmoid. Wall  thickening and underdistention, suspicious for an underlying carcinoma. A stricture secondary to prior diverticulitis could look similar. 2. Suspicion of cecal pneumatosis. Recommend laboratory/clinical correlation for bowel ischemia. 3. Subtle pericolonic edema involving the descending colon is likely secondary to distal obstruction. Concurrent diverticulitis could look similar. 4. No evidence of metastatic disease in the abdomen or pelvis. 5. Coronary artery atherosclerosis. Aortic Atherosclerosis (ICD10-I70.0). Electronically Signed   By: Abigail Miyamoto M.D.   On: 11/17/2021 18:28    Microbiology: Results for orders placed or performed during the hospital encounter of 11/17/21  Resp Panel by RT-PCR (Flu A&B, Covid) Anterior Nasal Swab     Status: None   Collection Time: 11/17/21  7:11 PM   Specimen: Anterior Nasal Swab  Result Value Ref Range Status   SARS Coronavirus 2 by RT PCR NEGATIVE NEGATIVE Final    Comment: (NOTE) SARS-CoV-2 target nucleic acids are NOT DETECTED.  The SARS-CoV-2 RNA is generally detectable in upper respiratory specimens during the acute phase of infection. The lowest concentration of SARS-CoV-2 viral copies this assay can detect is 138 copies/mL. A negative result does not preclude SARS-Cov-2 infection and should not be used as the sole basis for treatment or other patient management decisions. A negative result may occur with  improper specimen collection/handling, submission of specimen other than nasopharyngeal swab, presence of viral mutation(s) within the areas targeted by this assay, and inadequate number of viral copies(<138 copies/mL). A negative result must be combined with clinical observations, patient history, and epidemiological information. The expected result is Negative.  Fact Sheet for Patients:  EntrepreneurPulse.com.au  Fact Sheet for Healthcare Providers:  IncredibleEmployment.be  This test is no t yet  approved or cleared by the [place] FDA and  has been authorized for detection and/or diagnosis of SARS-CoV-2 by FDA under an Emergency Use Authorization (EUA). This EUA will remain  in effect (meaning this test can be used) for the duration of the COVID-19 declaration under Section 564(b)(1) of the Act, 21 U.S.C.section 360bbb-3(b)(1), unless the authorization is terminated  or revoked sooner.       Influenza A by PCR NEGATIVE NEGATIVE Final   Influenza B by PCR NEGATIVE NEGATIVE Final    Comment: (NOTE) The Xpert Xpress SARS-CoV-2/FLU/RSV plus assay is intended as an aid in the diagnosis of influenza from Nasopharyngeal swab specimens and should not be used as a sole basis for treatment. Nasal washings and aspirates are unacceptable for Xpert Xpress SARS-CoV-2/FLU/RSV testing.  Fact Sheet for Patients: EntrepreneurPulse.com.au  Fact Sheet for Healthcare Providers: IncredibleEmployment.be  This test is not yet approved or cleared by the [place] FDA and has been authorized for detection and/or diagnosis of SARS-CoV-2 by FDA under  an Emergency Use Authorization (EUA). This EUA will remain in effect (meaning this test can be used) for the duration of the COVID-19 declaration under Section 564(b)(1) of the Act, 21 U.S.C. section 360bbb-3(b)(1), unless the authorization is terminated or revoked.  Performed at [facility], [address] [place], [place] [address]     Labs: CBC: Recent Labs  Lab 11/20/21 0410 11/21/21 0844 11/22/21 0444 11/24/21 0600 11/25/21 0600  WBC 7.3 9.5 8.2 7.6 8.2  HGB 10.7* 10.8* 8.6* 8.5* 8.2*  HCT 33.5* 34.0* 26.1* 25.3* 24.9*  MCV 94.9 95.8 93.9 92.3 92.2  PLT 362 418* 342 332 834   Basic Metabolic Panel: Recent Labs  Lab 11/21/21 0431 11/22/21 0444 11/23/21 0513 11/24/21 0600 11/25/21 0600 11/26/21 0620  NA 139 137 141 137 138 138  K 4.1 3.1* 3.4* 3.6 3.7 3.4*  CL 104 102  104 104 105 106  CO2 21* _0 GLUCOSE 125* 152* 150* 159* 147* 135*  BUN _1 28* 30* 28*  CREATININE 1.18* 0.82 0.69 0.65 0.65 0.67  CALCIUM 8.6* 8.3* 8.7* 8.6* 8.9 8.8*  MG 1.9 1.7 1.8 1.6* 1.9 1.6*  PHOS 3.9  --  1.9* 3.3 3.5 3.9   Liver Function Tests: Recent Labs  Lab 11/20/21 0410 11/21/21 0431 11/23/21 0513 11/26/21 0620  AST 30  --  21 73*  ALT 30  --  15 59*  ALKPHOS 91  --  56 57  BILITOT 1.0  --  0.4 0.3  PROT 6.2*  --  5.5* 5.2*  ALBUMIN 2.9* 2.6* 2.0* 2.0*   CBG: Recent Labs  Lab 11/25/21 2146 11/25/21 2357 11/26/21 0429 11/26/21 0759 11/26/21 1154  GLUCAP 133* 115* 111* 129* 114*    Discharge time spent: greater than 30 minutes.  Signed: Loletha Grayer, MD [facility] 11/26/2021

## 2021-11-28 DIAGNOSIS — I129 Hypertensive chronic kidney disease with stage 1 through stage 4 chronic kidney disease, or unspecified chronic kidney disease: Secondary | ICD-10-CM | POA: Diagnosis not present

## 2021-11-28 DIAGNOSIS — Z48815 Encounter for surgical aftercare following surgery on the digestive system: Secondary | ICD-10-CM | POA: Diagnosis not present

## 2021-11-28 DIAGNOSIS — E1122 Type 2 diabetes mellitus with diabetic chronic kidney disease: Secondary | ICD-10-CM | POA: Diagnosis not present

## 2021-11-28 DIAGNOSIS — D631 Anemia in chronic kidney disease: Secondary | ICD-10-CM | POA: Diagnosis not present

## 2021-11-28 DIAGNOSIS — I700 Atherosclerosis of aorta: Secondary | ICD-10-CM | POA: Diagnosis not present

## 2021-11-28 DIAGNOSIS — F32A Depression, unspecified: Secondary | ICD-10-CM | POA: Diagnosis not present

## 2021-11-28 DIAGNOSIS — N183 Chronic kidney disease, stage 3 unspecified: Secondary | ICD-10-CM | POA: Diagnosis not present

## 2021-11-28 DIAGNOSIS — Z433 Encounter for attention to colostomy: Secondary | ICD-10-CM | POA: Diagnosis not present

## 2021-11-28 DIAGNOSIS — I2510 Atherosclerotic heart disease of native coronary artery without angina pectoris: Secondary | ICD-10-CM | POA: Diagnosis not present

## 2021-12-02 ENCOUNTER — Encounter: Payer: Self-pay | Admitting: Surgery

## 2021-12-02 ENCOUNTER — Other Ambulatory Visit: Payer: Self-pay

## 2021-12-02 ENCOUNTER — Ambulatory Visit (INDEPENDENT_AMBULATORY_CARE_PROVIDER_SITE_OTHER): Payer: Medicare HMO | Admitting: Surgery

## 2021-12-02 VITALS — BP 114/67 | HR 71 | Temp 98.0°F | Ht 62.0 in | Wt 170.0 lb

## 2021-12-02 DIAGNOSIS — Z09 Encounter for follow-up examination after completed treatment for conditions other than malignant neoplasm: Secondary | ICD-10-CM

## 2021-12-02 DIAGNOSIS — K56609 Unspecified intestinal obstruction, unspecified as to partial versus complete obstruction: Secondary | ICD-10-CM

## 2021-12-02 DIAGNOSIS — Z933 Colostomy status: Secondary | ICD-10-CM

## 2021-12-02 DIAGNOSIS — Z9049 Acquired absence of other specified parts of digestive tract: Secondary | ICD-10-CM

## 2021-12-02 NOTE — Patient Instructions (Addendum)
Please call with any questions or concerns.  Staples removed today. Steri strips placed. These will begin to fall off within 7-14 days. You will need to place a piece of gauze over the top of the incision and at the bottom of the incision to catch any drainage until the drain sites heal completely.  Referral sent to [facility]. Someone from their office to schedule an appointment. If you do not hear from their office by next week please call the their office. [phone] .  We will contact you in January to schedule follow up appointment with Dr.Piscoya.

## 2021-12-03 ENCOUNTER — Encounter: Payer: Self-pay | Admitting: Surgery

## 2021-12-03 DIAGNOSIS — Z79899 Other long term (current) drug therapy: Secondary | ICD-10-CM | POA: Diagnosis not present

## 2021-12-03 DIAGNOSIS — I10 Essential (primary) hypertension: Secondary | ICD-10-CM | POA: Diagnosis not present

## 2021-12-03 DIAGNOSIS — Z933 Colostomy status: Secondary | ICD-10-CM | POA: Diagnosis not present

## 2021-12-03 DIAGNOSIS — K56699 Other intestinal obstruction unspecified as to partial versus complete obstruction: Secondary | ICD-10-CM | POA: Diagnosis not present

## 2021-12-03 DIAGNOSIS — E079 Disorder of thyroid, unspecified: Secondary | ICD-10-CM | POA: Diagnosis not present

## 2021-12-03 DIAGNOSIS — E782 Mixed hyperlipidemia: Secondary | ICD-10-CM | POA: Diagnosis not present

## 2021-12-03 DIAGNOSIS — E118 Type 2 diabetes mellitus with unspecified complications: Secondary | ICD-10-CM | POA: Diagnosis not present

## 2021-12-03 NOTE — Progress Notes (Signed)
12/02/2021  HPI: Katelyn Simmons is a 76 y.o. female s/p Hartmann's procedure for diverticular stricture in the sigmoid colon causing a large bowel obstruction on 11/20/21.  She presents today for follow up.  She reports that she's doing well, eating better, without any pain issues.  Vital signs: BP 114/67   Pulse 71   Temp 98 F (36.7 C) (Oral)   Ht '5\' 2"'$  (1.575 m)   Wt 170 lb (77.1 kg)   SpO2 99%   BMI 31.09 kg/m    Physical Exam: Constitutional:  No acute distress Abdomen:  soft, non-distended, non-tender to palpation.  Incision is clean, dry, with mild serous drainage from around penrose drain.  Blake drain with serous fluid.  Staples, penrose drain, and blake drain all removed today without issues.  Steri strips applied and dry gauze dressing over drain sites.  Assessment/Plan: This is a 76 y.o. female s/p Hartmann's procedure  --Patient is healing well, without issues.  Discussed starting a high fiber diet and increase protein intake to get stronger.   --Discussed daily dressing changes until the sites close up --Will send referral to GI for colonoscopy in about two months, and then she will follow up with me early next year to discuss ostomy reversal.   Melvyn Neth, MD [facility]

## 2021-12-11 ENCOUNTER — Encounter: Payer: Self-pay | Admitting: Emergency Medicine

## 2021-12-11 DIAGNOSIS — L089 Local infection of the skin and subcutaneous tissue, unspecified: Secondary | ICD-10-CM | POA: Diagnosis not present

## 2021-12-11 DIAGNOSIS — T8140XA Infection following a procedure, unspecified, initial encounter: Secondary | ICD-10-CM | POA: Insufficient documentation

## 2021-12-11 DIAGNOSIS — Z9889 Other specified postprocedural states: Secondary | ICD-10-CM | POA: Diagnosis not present

## 2021-12-11 DIAGNOSIS — K838 Other specified diseases of biliary tract: Secondary | ICD-10-CM | POA: Diagnosis not present

## 2021-12-11 LAB — COMPREHENSIVE METABOLIC PANEL
ALT: 38 U/L (ref 0–44)
AST: 36 U/L (ref 15–41)
Albumin: 2.4 g/dL — ABNORMAL LOW (ref 3.5–5.0)
Alkaline Phosphatase: 92 U/L (ref 38–126)
Anion gap: 6 (ref 5–15)
BUN: 12 mg/dL (ref 8–23)
CO2: 26 mmol/L (ref 22–32)
Calcium: 8.7 mg/dL — ABNORMAL LOW (ref 8.9–10.3)
Chloride: 106 mmol/L (ref 98–111)
Creatinine, Ser: 0.83 mg/dL (ref 0.44–1.00)
GFR, Estimated: >60 mL/min (ref >60)
Glucose, Bld: 153 mg/dL — ABNORMAL HIGH (ref 70–99)
Potassium: 4.3 mmol/L (ref 3.5–5.1)
Sodium: 138 mmol/L (ref 135–145)
Total Bilirubin: 0.4 mg/dL (ref 0.3–1.2)
Total Protein: 6.9 g/dL (ref 6.5–8.1)

## 2021-12-11 LAB — CBC WITH DIFFERENTIAL/PLATELET
Abs Immature Granulocytes: 0.15 10*3/uL — ABNORMAL HIGH (ref 0.00–0.07)
Basophils Absolute: 0 10*3/uL (ref 0.0–0.1)
Basophils Relative: 0 %
Eosinophils Absolute: 0.2 10*3/uL (ref 0.0–0.5)
Eosinophils Relative: 2 %
HCT: 24.9 % — ABNORMAL LOW (ref 36.0–46.0)
Hemoglobin: 8.3 g/dL — ABNORMAL LOW (ref 12.0–15.0)
Immature Granulocytes: 2 %
Lymphocytes Relative: 13 %
Lymphs Abs: 1.3 10*3/uL (ref 0.7–4.0)
MCH: 30 pg (ref 26.0–34.0)
MCHC: 33.3 g/dL (ref 30.0–36.0)
MCV: 89.9 fL (ref 80.0–100.0)
Monocytes Absolute: 1.1 10*3/uL — ABNORMAL HIGH (ref 0.1–1.0)
Monocytes Relative: 11 %
Neutro Abs: 7.4 10*3/uL (ref 1.7–7.7)
Neutrophils Relative %: 72 %
Platelets: 503 10*3/uL — ABNORMAL HIGH (ref 150–400)
RBC: 2.77 MIL/uL — ABNORMAL LOW (ref 3.87–5.11)
RDW: 15 % (ref 11.5–15.5)
WBC: 10.3 10*3/uL (ref 4.0–10.5)
nRBC: 0 % (ref 0.0–0.2)

## 2021-12-11 NOTE — ED Triage Notes (Signed)
Pt presents via POV with complaints of abdominal bleeding & pain post drain (/p Hartmann's procedure) removal from SOB 3 weeks ago. The drain was removed on 11/1 and starting tonight she had and had some bleeding- minimal bleeding to the dressing. She endorses a lot of straining when getting up off of the toilet and going from sitting to standing. Pt has been seen by her home health RN, who recommended she come to the ED if the pain and bleeding continued. Denies fevers,  CP or SOB.

## 2021-12-12 ENCOUNTER — Emergency Department
Admission: EM | Admit: 2021-12-12 | Discharge: 2021-12-12 | Disposition: A | Discharge status: Home / Self Care | Payer: Medicare HMO | Attending: Emergency Medicine | Admitting: Emergency Medicine

## 2021-12-12 ENCOUNTER — Emergency Department: Payer: Medicare HMO

## 2021-12-12 DIAGNOSIS — K838 Other specified diseases of biliary tract: Secondary | ICD-10-CM | POA: Diagnosis not present

## 2021-12-12 DIAGNOSIS — L089 Local infection of the skin and subcutaneous tissue, unspecified: Secondary | ICD-10-CM

## 2021-12-12 DIAGNOSIS — Z9889 Other specified postprocedural states: Secondary | ICD-10-CM | POA: Diagnosis not present

## 2021-12-12 MED ORDER — AMOXICILLIN-POT CLAVULANATE 875-125 MG PO TABS: 1.0000 | ORAL_TABLET | Freq: Two times a day (BID) | ORAL | 0 refills | Status: DC

## 2021-12-12 MED ORDER — IOHEXOL 300 MG/ML  SOLN
100.0000 mL | Freq: Once | INTRAMUSCULAR | Status: AC | PRN
  Administered 2021-12-12: 100 mL via INTRAVENOUS

## 2021-12-12 MED ORDER — AMOXICILLIN-POT CLAVULANATE 875-125 MG PO TABS
1.0000 | ORAL_TABLET | Freq: Once | ORAL | Status: AC
  Administered 2021-12-12: 1 via ORAL
  Filled 2021-12-12: qty 1

## 2021-12-12 NOTE — ED Notes (Signed)
Patient transported to CT 

## 2021-12-12 NOTE — Discharge Instructions (Signed)
Please continue your regular medications and keep the wound clean and dry.  You should change the dressing at least twice a day or whenever it gets noticeably soiled or saturated.  You want to try to drain as much of the infection out of it as possible.  Please take the full course of antibiotics as prescribed.  The office of Dr. Hampton Abbot should call you on Monday to schedule follow-up appointment, but if you have not heard from them by the afternoon, you can call and explained that Dr. Hampton Abbot wants to see you in clinic by Wednesday.  If you feel like you are getting worse in the meantime, please go for a same-day appointment or return to the nearest emergency department.

## 2021-12-12 NOTE — ED Notes (Signed)
DC instructions given verbally and in writing, understanding voiced, signature obtained. Pt left in stable condition with son.

## 2021-12-12 NOTE — ED Provider Notes (Signed)
[facility] Provider Note    Event Date/Time   First MD Initiated Contact with Patient 12/12/21 0059     (approximate)   History   Post-op Problem   HPI  Katelyn Simmons is a 76 y.o. female  Who presents for evaluation of drainage from a postop surgical site.  She reports that she had surgery by Dr. Hampton Abbot about 3 weeks ago.  She had a diverting ostomy and 2 drains placed.  The drain on her right lower abdomen was removed about a week ago.  Since that time she was doing okay but then she developed some bleeding and drainage that she was worried might be pus.  Her home health nurse disagreed that she needed evaluation.  The patient reports that she is not having significant pain except for right around the wound, but she said it feels much better since that it has been draining.  She has had no nausea or vomiting.  She denies fever, chest pain, and shortness of breath.       Physical Exam   Triage Vital Signs: ED Triage Vitals  Enc Vitals Group     BP 12/11/21 2329 (!) 112/58     Pulse Rate 12/11/21 2329 81     Resp 12/11/21 2329 17     Temp 12/11/21 2329 98 F (36.7 C)     Temp Source 12/11/21 2329 Oral     SpO2 12/11/21 2329 98 %     Weight 12/11/21 2331 77.1 kg (170 lb)     Height 12/11/21 2331 1.575 m ('5\' 2"'$ )     Head Circumference --      Peak Flow --      Pain Score --      Pain Loc --      Pain Edu? --      Excl. in [place]? --     Most recent vital signs: Vitals:   12/12/21 0530 12/12/21 0547  BP: (!) 113/48 (!) 113/54  Pulse: 79 77  Resp:  16  Temp:  98.3 F (36.8 C)  SpO2: 99% 99%     General: Awake, no distress.  Generally well-appearing for age and circumstances. CV:  Good peripheral perfusion.  No tachycardia, regular rate and rhythm. Resp:  Normal effort.  Lungs are clear to auscultation. Abd:  No distention.  Patient has a diverting ostomy with appropriate contents in the bag.  She has a wound with some surrounding  induration in the right lower part of her abdomen that she says was the site of her drain.  With gentle pressure around it, it expresses a copious amount of bloody pus.   ED Results / Procedures / Treatments   Labs (all labs ordered are listed, but only abnormal results are displayed) Labs Reviewed  CBC WITH DIFFERENTIAL/PLATELET - Abnormal; Notable for the following components:      Result Value   RBC 2.77 (*)    Hemoglobin 8.3 (*)    HCT 24.9 (*)    Platelets 503 (*)    Monocytes Absolute 1.1 (*)    Abs Immature Granulocytes 0.15 (*)    All other components within normal limits  COMPREHENSIVE METABOLIC PANEL - Abnormal; Notable for the following components:   Glucose, Bld 153 (*)    Calcium 8.7 (*)    Albumin 2.4 (*)    All other components within normal limits  AEROBIC/ANAEROBIC CULTURE W GRAM STAIN (SURGICAL/DEEP WOUND)     RADIOLOGY See hospital course for  details: Small fluid collection around the site of the prior drain placement, radiology questioned SBO/ileus, otherwise unremarkable.    PROCEDURES:  Critical Care performed: No  Procedures   MEDICATIONS ORDERED IN ED: Medications  iohexol (OMNIPAQUE) 300 MG/ML solution 100 mL (100 mLs Intravenous Contrast Given 12/12/21 0230)  amoxicillin-clavulanate (AUGMENTIN) 875-125 MG per tablet 1 tablet (1 tablet Oral Given 12/12/21 0536)     IMPRESSION / MDM / ASSESSMENT AND PLAN / ED COURSE  I reviewed the triage vital signs and the nursing notes.                              Differential diagnosis includes, but is not limited to, postoperative infection such as intra-abdominal abscess, cutaneous abscess, fluid collection, phlegmon, SBO/ileus, sepsis, bacteremia.  Patient's presentation is most consistent with acute presentation with potential threat to life or bodily function.  Labs/studies ordered: CBC with differential, CMP, wound culture, CT abdomen/pelvis.  Patient is generally well-appearing and in no  distress.  Normal vital signs.  Labs pending.  Given the purulent discharge from the prior wound site, I suspect it is mostly cutaneous and superficial, but I ordered CT abdomen/pelvis to evaluate for the possibility of an intra-abdominal infection or fistula connecting to the outside.  Patient agrees with the plan.  No indication for empiric antibiotics at this point.     Clinical Course as of 12/12/21 0729  Sat Dec 12, 2021  0157 Labs are notable for no leukocytosis and anemia which is at baseline with a hemoglobin of 8.3 which is stable over the last multiple measurements.  Comprehensive metabolic panel is essentially normal. [CF]  0326 CT ABDOMEN PELVIS W CONTRAST I viewed and interpreted the patient's CT scan.  There is a collection of fluid or hematoma at the site that is draining.  The radiologist did not think that it was a drainable abscess but I know that it is draining purulent and bloody material.  There was also some concern by the radiologist that the patient may have an obstruction or ileus.  However, I reassessed the patient and she has no signs or symptoms of obstruction or ileus with no significant tenderness to palpation and no nausea nor vomiting.  I talked with her about the results and how I think she will probably be appropriate for discharge and outpatient follow-up.  Dr. Hampton Abbot is in route to the hospital right now for a critical patient, but I will talk with him about this patient prior to him going to the operating room for the other patient.  The patient and her son agree with the plan. [CF]  [id number] I personally collected a wound culture of the pus from her wound and it is in process. [CF]  [phone] Consulted in person with Dr. Hampton Abbot who evaluated the patient in person in the emergency department.  He expressed some additional pus and feels that this is relatively superficial and appropriate for outpatient management.  He recommended Augmentin.  I ordered a first dose in the  ED and wrote a 10-day prescription.  He is facilitating close outpatient follow-up.  I reiterated all of this with the patient and her son and they understand and agree with the plan. [CF]    Clinical Course User Index [CF] Hinda Kehr, MD     FINAL CLINICAL IMPRESSION(S) / ED DIAGNOSES   Final diagnoses:  Wound infection     Rx / DC Orders   ED Discharge  Orders          Ordered    amoxicillin-clavulanate (AUGMENTIN) 875-125 MG tablet  2 times daily        12/12/21 0540             Note:  This document was prepared using Dragon voice recognition software and may include unintentional dictation errors.   Hinda Kehr, MD 12/12/21 [phone]

## 2021-12-12 NOTE — ED Notes (Signed)
Assisted to [facility] to void and back to bed.  Reinforced dressing to abd which is continually draining.  Denies pain or distress.

## 2021-12-16 ENCOUNTER — Other Ambulatory Visit: Payer: Self-pay

## 2021-12-16 ENCOUNTER — Encounter: Payer: Self-pay | Admitting: Surgery

## 2021-12-16 ENCOUNTER — Telehealth: Payer: Self-pay

## 2021-12-16 ENCOUNTER — Ambulatory Visit (INDEPENDENT_AMBULATORY_CARE_PROVIDER_SITE_OTHER): Payer: Medicare HMO | Admitting: Surgery

## 2021-12-16 VITALS — BP 125/71 | HR 72 | Temp 98.0°F | Ht 62.0 in | Wt 171.0 lb

## 2021-12-16 DIAGNOSIS — K449 Diaphragmatic hernia without obstruction or gangrene: Secondary | ICD-10-CM | POA: Insufficient documentation

## 2021-12-16 DIAGNOSIS — E538 Deficiency of other specified B group vitamins: Secondary | ICD-10-CM | POA: Diagnosis not present

## 2021-12-16 DIAGNOSIS — K760 Fatty (change of) liver, not elsewhere classified: Secondary | ICD-10-CM | POA: Insufficient documentation

## 2021-12-16 DIAGNOSIS — K219 Gastro-esophageal reflux disease without esophagitis: Secondary | ICD-10-CM | POA: Insufficient documentation

## 2021-12-16 DIAGNOSIS — K5730 Diverticulosis of large intestine without perforation or abscess without bleeding: Secondary | ICD-10-CM | POA: Insufficient documentation

## 2021-12-16 DIAGNOSIS — K56609 Unspecified intestinal obstruction, unspecified as to partial versus complete obstruction: Secondary | ICD-10-CM

## 2021-12-16 DIAGNOSIS — M2550 Pain in unspecified joint: Secondary | ICD-10-CM | POA: Insufficient documentation

## 2021-12-16 DIAGNOSIS — T8149XA Infection following a procedure, other surgical site, initial encounter: Secondary | ICD-10-CM

## 2021-12-16 DIAGNOSIS — I10 Essential (primary) hypertension: Secondary | ICD-10-CM | POA: Insufficient documentation

## 2021-12-16 DIAGNOSIS — Z09 Encounter for follow-up examination after completed treatment for conditions other than malignant neoplasm: Secondary | ICD-10-CM

## 2021-12-16 NOTE — Telephone Encounter (Signed)
        Patient  visited [place] on 11/11   Telephone encounter attempt :  1st  A HIPAA compliant voice message was left requesting a return call.  Instructed patient to call back     [place]  [phone] [address], [place], [place] [address] Phone: [phone] Email: [email or web address]

## 2021-12-16 NOTE — Patient Instructions (Addendum)
Please continue to change the dressing daily.   Let us know when you have your Colonoscopy done so we can schedule an office visit with Dr.Piscoya to discuss your Colostomy reversal.

## 2021-12-16 NOTE — Progress Notes (Signed)
12/16/2021  HPI: Katelyn Simmons is a 76 y.o. female s/p exlap and Hartmann's procedure for obstructing sigmoid stricture on 11/20/21.  On her last visit on 12/02/21, her blake drain was removed without issues.  However, she presented to the ED on 12/12/21 with abdominal pain and drainage from the blake drain site.  There was erythema and infected hematoma fluid.  This was evacuated at bedside and she was started on oral antibiotics.  She presents today for follow up.  She has been doing better, without any worsening pain.  The drainage is slowing down and the erythema is resolving.  Vital signs: BP 125/71   Pulse 72   Temp 98 F (36.7 C) (Oral)   Ht '5\' 2"'$  (1.575 m)   Wt 171 lb (77.6 kg)   SpO2 99%   BMI 31.28 kg/m    Physical Exam: Constitutional: No acute distress Abdomen:  Soft, non-distended, non-tender. RLQ drain site probed with qtip, and is much shallower compared to before, only about 7 mm deep.  Drainage is serosanguinous. Dry gauze dressing applied.  Assessment/Plan: This is a 76 y.o. female s/p Hartmann's procedure, with infected drain site.  --Patient is improving, without any significant erythema or induration, and the wound is getting smaller.  Advised to continue dry gauze dressing changes once or twice daily depending on saturation.  Continue her oral antibiotics until the course is completed. --She has an appointment with Dr. Haig Prophet with GI for colonoscopy scheduling later this month. --Follow up with me towards early/mid January or after colonoscopy so we can discuss ostomy reversal.   Melvyn Neth, MD [place]

## 2021-12-17 LAB — AEROBIC/ANAEROBIC CULTURE W GRAM STAIN (SURGICAL/DEEP WOUND): Special Requests: NORMAL

## 2021-12-18 ENCOUNTER — Telehealth: Payer: Self-pay

## 2021-12-18 MED ORDER — DOXYCYCLINE HYCLATE 100 MG PO CAPS: 100.0000 mg | ORAL_CAPSULE | Freq: Two times a day (BID) | ORAL | 0 refills | Status: AC

## 2021-12-18 NOTE — Telephone Encounter (Signed)
Spoke with the patient and let her know. She will pick up the new prescription today and stop the Augmentin.

## 2021-12-18 NOTE — Telephone Encounter (Signed)
-----   Message from Olean Ree, MD sent at 12/18/2021  8:58 AM EST ----- Regarding: Abx change Hi,  I saw this patient on Wednesday for follow up from the ED with a drain site infection.  The cultures from the ED grew MRSA, but she only got Augmentin thinking this could be more intra-abdominal source.  Could y'all contact her to please stop her Augmentin and change her to Doxycycline 100 mg BID x 7 days?  Thanks!  [employer]

## 2021-12-18 NOTE — Progress Notes (Signed)
ED Antimicrobial Stewardship Positive Culture Follow Up   Maura L Trieu is an 76 y.o. female who presented to [facility] on 12/12/2021 with a chief complaint of  Chief Complaint  Patient presents with   Post-op Problem    Recent Results (from the past 720 hour(s))  Aerobic/Anaerobic Culture w Gram Stain (surgical/deep wound)     Status: None   Collection Time: 12/12/21  2:17 AM   Specimen: Wound  Result Value Ref Range Status   Specimen Description   Final    WOUND Performed at [facility], [address], [place], [place] [address]    Special Requests   Final    Normal Performed at [facility], [address] [place], [place] [address]    Gram Stain   Final    ABUNDANT WBC PRESENT, PREDOMINANTLY PMN FEW GRAM POSITIVE COCCI IN PAIRS IN CLUSTERS    Culture   Final    ABUNDANT METHICILLIN RESISTANT STAPHYLOCOCCUS AUREUS NO ANAEROBES ISOLATED Performed at [facility], [place] [address] [place], [place] [address]    Report Status 12/17/2021 FINAL  Final   Organism ID, Bacteria METHICILLIN RESISTANT STAPHYLOCOCCUS AUREUS  Final      Susceptibility   Methicillin resistant staphylococcus aureus - MIC*    CIPROFLOXACIN >=8 RESISTANT Resistant     ERYTHROMYCIN >=8 RESISTANT Resistant     GENTAMICIN <=0.5 SENSITIVE Sensitive     OXACILLIN >=4 RESISTANT Resistant     TETRACYCLINE <=1 SENSITIVE Sensitive     VANCOMYCIN 1 SENSITIVE Sensitive     TRIMETH/SULFA <=10 SENSITIVE Sensitive     CLINDAMYCIN <=0.25 SENSITIVE Sensitive     RIFAMPIN <=0.5 SENSITIVE Sensitive     Inducible Clindamycin NEGATIVE Sensitive     * ABUNDANT METHICILLIN RESISTANT STAPHYLOCOCCUS AUREUS    '[x]'$  Treated with Augmentin, organism resistant to prescribed antimicrobial '[]'$  Patient discharged originally without antimicrobial agent and treatment is now indicated  Contacted Dr. Hampton Abbot to relay the results as patient followed up with him in clinic on 11/15. Recommended doxycycline  if he decides to pursue treatment. Dr. Hampton Abbot said that he would have his office staff follow up with patient.  Dara Hoyer, PharmD PGY-1 Pharmacy Resident 12/18/2021 8:59 AM

## 2021-12-23 ENCOUNTER — Other Ambulatory Visit: Payer: Self-pay

## 2021-12-23 ENCOUNTER — Encounter: Payer: Self-pay | Admitting: Gastroenterology

## 2021-12-23 ENCOUNTER — Ambulatory Visit (INDEPENDENT_AMBULATORY_CARE_PROVIDER_SITE_OTHER): Payer: Medicare HMO | Admitting: Gastroenterology

## 2021-12-23 VITALS — BP 166/69 | HR 69 | Temp 97.7°F | Ht 62.0 in | Wt 171.2 lb

## 2021-12-23 DIAGNOSIS — Z8719 Personal history of other diseases of the digestive system: Secondary | ICD-10-CM

## 2021-12-23 DIAGNOSIS — K2270 Barrett's esophagus without dysplasia: Secondary | ICD-10-CM | POA: Diagnosis not present

## 2021-12-23 DIAGNOSIS — K219 Gastro-esophageal reflux disease without esophagitis: Secondary | ICD-10-CM

## 2021-12-23 DIAGNOSIS — K56699 Other intestinal obstruction unspecified as to partial versus complete obstruction: Secondary | ICD-10-CM | POA: Diagnosis not present

## 2021-12-23 MED ORDER — CLENPIQ 10-3.5-12 MG-GM -GM/175ML PO SOLN: 175.0000 mL | Freq: Once | ORAL | 0 refills | Status: AC

## 2021-12-23 NOTE — Progress Notes (Signed)
Cephas Darby, MD [address]  [place]  [place], [place] [address]  Main: [phone]  Fax: [phone]    Gastroenterology Consultation  Referring Provider:     Idelle Crouch, MD Primary Care Physician:  Idelle Crouch, MD Primary Gastroenterologist:  Dr. Cephas Darby Reason for Consultation: History of sigmoid colon obstruction, Barrett's esophagus        HPI:   Katelyn Simmons is a 76 y.o. female referred by Dr. Doy Hutching, Leonie Douglas, MD  for consultation & management of history of sigmoid colon obstruction.  Patient had history of diverticular stricture in the sigmoid colon resulting in large bowel obstruction on 11/20/2021, s/p ex lap, colostomy and Hartman's procedure.  She recently had issues with drainage from the [facility] drain site, which has currently resolved on oral antibiotics.  The wound has closed and patient reports that the site is no longer draining.  She has 6 more days of antibiotics to finish.  Patient denies any GI symptoms.  She reports having brown mushy stool from the stoma.  She is anticipating to have takedown surgery in January.  Referred to GI for colonoscopy prior to surgery.  She does have history of Barrett's esophagus based on upper endoscopy in 2018.  On Protonix 40 mg daily She does have history of chronic normocytic anemia, normal serum ferritin levels, no evidence of B12 deficiency.  Patient reports being chronically anemic.  NSAIDs: None  Antiplts/Anticoagulants/Anti thrombotics: None  GI Procedures: Reports undergoing colonoscopy more than 2 years ago at [facility] by Dr. Alice Reichert.  Report not available  Past Medical History:  Diagnosis Date   Abnormal finding on EKG    Anemia    pernicious anemia   Anxiety    Arthritis    everywhere   B12 deficiency    Barrett's esophagus    CKD (chronic kidney disease)    stage III ckd   Collagen vascular disease (HCC)    RA   Depression    Diabetes mellitus without complication  (HCC)    Dysphagia    Dyspnea    with exertion   Dysrhythmia    sinus arrythmia or just stops momentarily, per patient   Esophageal stricture    Fatty liver    GERD (gastroesophageal reflux disease)    Goiter, nontoxic, multinodular    History of kidney stones    Hx of repair of left rotator cuff    Hyperlipidemia    Hypertension    Hypothyroidism    Neuromuscular disorder (HCC)    fibromyalgia   Obesity    Pain    Rotator cuff tear arthropathy of both shoulders    Shingles 2008   Stroke [facility])    mini strokes about 20 years ago.  some memory loss residual   Throat pain     Past Surgical History:  Procedure Laterality Date   ABDOMINAL HYSTERECTOMY     CHOLECYSTECTOMY     COLECTOMY WITH COLOSTOMY CREATION/HARTMANN PROCEDURE N/A 11/20/2021   Procedure: COLECTOMY WITH COLOSTOMY CREATION/HARTMANN PROCEDURE;  Surgeon: Olean Ree, MD;  Location: [facility] ORS;  Service: General;  Laterality: N/A;   endoscopic carpal tunnel release Bilateral    ESOPHAGOGASTRODUODENOSCOPY     ESOPHAGOGASTRODUODENOSCOPY (EGD) WITH PROPOFOL N/A 03/31/2016   Procedure: ESOPHAGOGASTRODUODENOSCOPY (EGD) WITH PROPOFOL;  Surgeon: Manya Silvas, MD;  Location: [facility] ENDOSCOPY;  Service: Endoscopy;  Laterality: N/A;   LUMBAR LAMINECTOMY/DECOMPRESSION MICRODISCECTOMY N/A 10/25/2018   Procedure: LUMBAR LAMINECTOMY/DECOMPRESSION MICRODISCECTOMY 2 LEVELS L3-5;  Surgeon:  Meade Maw, MD;  Location: [facility] ORS;  Service: Neurosurgery;  Laterality: N/A;   UPPER GI ENDOSCOPY  10/2018   [place]     Current Outpatient Medications:    acetaminophen (TYLENOL) 500 MG tablet, Take 2 tablets (1,000 mg total) by mouth every 6 (six) hours as needed for mild pain or fever., Disp: 30 tablet, Rfl: 0   beta carotene w/minerals (OCUVITE) tablet, Take 1 tablet by mouth daily., Disp: , Rfl:    Blood Glucose Monitoring Suppl (GMATE SMART METER KIT) DEVI, by Does not apply route., Disp: , Rfl:    busPIRone (BUSPAR) 10 MG  tablet, Take 10 mg by mouth 2 (two) times daily., Disp: , Rfl:    Cholecalciferol 25 MCG (1000 UT) capsule, Take 1,000 Units by mouth every evening. , Disp: , Rfl:    doxycycline (VIBRAMYCIN) 100 MG capsule, Take 1 capsule (100 mg total) by mouth 2 (two) times daily for 7 days., Disp: 14 capsule, Rfl: 0   feeding supplement, GLUCERNA SHAKE, (GLUCERNA SHAKE) LIQD, Take 237 mLs by mouth 3 (three) times daily between meals., Disp: 21330 mL, Rfl: 0   fenofibrate 160 MG tablet, Take 160 mg by mouth at bedtime. , Disp: , Rfl:    glucose blood test strip, 1 each by Other route as needed for other. Use as instructed, Disp: , Rfl:    levothyroxine (SYNTHROID) 125 MCG tablet, Take 125 mcg by mouth daily before breakfast., Disp: , Rfl:    liothyronine (CYTOMEL) 5 MCG tablet, Take 5 mcg by mouth daily., Disp: , Rfl:    methotrexate (RHEUMATREX) 2.5 MG tablet, Take 20 mg by mouth once a week., Disp: , Rfl:    metoprolol succinate (TOPROL-XL) 100 MG 24 hr tablet, Take 100 mg by mouth daily. Take with or immediately following a meal., Disp: , Rfl:    Misc Natural Products (LUTEIN 20 PO), Take 20 mg by mouth daily., Disp: , Rfl:    pantoprazole (PROTONIX) 40 MG tablet, Take 40 mg by mouth daily., Disp: , Rfl:    pioglitazone (ACTOS) 45 MG tablet, Take 45 mg by mouth daily., Disp: , Rfl:    QUEtiapine (SEROQUEL) 50 MG tablet, Take 50 mg by mouth at bedtime., Disp: , Rfl:    rosuvastatin (CRESTOR) 10 MG tablet, Take 10 mg by mouth at bedtime., Disp: , Rfl:    Sod Picosulfate-Mag Ox-Cit Acd (CLENPIQ) 10-3.5-12 MG-GM -GM/175ML SOLN, Take 175 mLs by mouth once for 1 dose., Disp: 350 mL, Rfl: 0   venlafaxine XR (EFFEXOR-XR) 150 MG 24 hr capsule, Take 150 mg by mouth daily with breakfast., Disp: , Rfl:    vitamin B-12 (CYANOCOBALAMIN) 1000 MCG tablet, Take 1,000 mcg by mouth daily., Disp: , Rfl:    Family History  Problem Relation Age of Onset   Breast cancer Neg Hx      Social History   Tobacco Use   Smoking  status: Never   Smokeless tobacco: Never  Vaping Use   Vaping Use: Never used  Substance Use Topics   Alcohol use: Yes    Comment: rare   Drug use: No    Allergies as of 12/23/2021 - Review Complete 12/23/2021  Allergen Reaction Noted   Sulfa antibiotics Swelling 10/16/2015   Eggs or egg-derived products Nausea And Vomiting 10/16/2015   Methocarbamol Other (See Comments) 11/23/2018   Parafon forte dsc [chlorzoxazone] Other (See Comments) 03/30/2016   Sulfur dioxide Other (See Comments) 12/16/2021   Tizanidine Other (See Comments) 08/10/2019   Codeine  Other (See Comments) 10/16/2015    Review of Systems:    All systems reviewed and negative except where noted in HPI.   Physical Exam:  BP (!) 166/69 (BP Location: Left Arm, Patient Position: Sitting, Cuff Size: Normal)   Pulse 69   Temp 97.7 F (36.5 C) (Oral)   Ht _0  (1.575 m)   Wt 171 lb 4 oz (77.7 kg)   BMI 31.32 kg/m  No LMP recorded. Patient has had a hysterectomy.  General:   Alert,  Well-developed, well-nourished, pleasant and cooperative in NAD Head:  Normocephalic and atraumatic. Eyes:  Sclera clear, no icterus.   Conjunctiva pink. Ears:  Normal auditory acuity. Nose:  No deformity, discharge, or lesions. Mouth:  No deformity or lesions,oropharynx pink & moist. Neck:  Supple; no masses or thyromegaly. Lungs:  Respirations even and unlabored.  Clear throughout to auscultation.   No wheezes, crackles, or rhonchi. No acute distress. Heart:  Regular rate and rhythm; no murmurs, clicks, rubs, or gallops. Abdomen:  Normal bowel sounds.  Healing vertical scar, stoma in the left lower quadrant with stomal contents brown mushy stool, the wound at the [facility] drain site has closed, has a scab on it, soft, non-tender and non-distended without masses, hepatosplenomegaly or hernias noted.  No guarding or rebound tenderness.   Rectal: Not performed Msk:  Symmetrical without gross deformities. Good, equal movement & strength  bilaterally. Pulses:  Normal pulses noted. Extremities:  No clubbing or edema.  No cyanosis. Neurologic:  Alert and oriented x3;  grossly normal neurologically. Skin:  Intact without significant lesions or rashes. No jaundice. Psych:  Alert and cooperative. Normal mood and affect.  Imaging Studies: Reviewed  Assessment and Plan:   Katelyn Simmons is a 76 y.o. female with history of diabetes, hypothyroidism, history of large bowel obstruction secondary to diverticular stricture at the sigmoid colon s/p ex lap and diverting colostomy, Hartman's procedure, history of short segment Barrett's esophagus without dysplasia  Recommend diagnostic colonoscopy with TI evaluation as well as examining Hartman's pouch Recommend upper endoscopy for surveillance of Barrett's esophagus as well as gastric and duodenal biopsies to evaluate anemia Continue long-term Protonix 40 mg p.o. daily  Follow up based on the above workup   Cephas Darby, MD

## 2021-12-31 ENCOUNTER — Other Ambulatory Visit (INDEPENDENT_AMBULATORY_CARE_PROVIDER_SITE_OTHER): Payer: Self-pay | Admitting: Neurology

## 2021-12-31 ENCOUNTER — Encounter (INDEPENDENT_AMBULATORY_CARE_PROVIDER_SITE_OTHER): Payer: Self-pay | Admitting: Vascular Surgery

## 2021-12-31 ENCOUNTER — Ambulatory Visit (INDEPENDENT_AMBULATORY_CARE_PROVIDER_SITE_OTHER): Payer: Medicare HMO | Admitting: Vascular Surgery

## 2021-12-31 ENCOUNTER — Ambulatory Visit (INDEPENDENT_AMBULATORY_CARE_PROVIDER_SITE_OTHER): Payer: Medicare HMO

## 2021-12-31 VITALS — BP 156/75 | HR 66 | Resp 16 | Wt 172.6 lb

## 2021-12-31 DIAGNOSIS — F32A Depression, unspecified: Secondary | ICD-10-CM | POA: Diagnosis not present

## 2021-12-31 DIAGNOSIS — Z48815 Encounter for surgical aftercare following surgery on the digestive system: Secondary | ICD-10-CM | POA: Diagnosis not present

## 2021-12-31 DIAGNOSIS — M25562 Pain in left knee: Secondary | ICD-10-CM | POA: Diagnosis not present

## 2021-12-31 DIAGNOSIS — I10 Essential (primary) hypertension: Secondary | ICD-10-CM | POA: Diagnosis not present

## 2021-12-31 DIAGNOSIS — I70219 Atherosclerosis of native arteries of extremities with intermittent claudication, unspecified extremity: Secondary | ICD-10-CM

## 2021-12-31 DIAGNOSIS — E1142 Type 2 diabetes mellitus with diabetic polyneuropathy: Secondary | ICD-10-CM

## 2021-12-31 DIAGNOSIS — M25561 Pain in right knee: Secondary | ICD-10-CM

## 2021-12-31 DIAGNOSIS — M79606 Pain in leg, unspecified: Secondary | ICD-10-CM | POA: Diagnosis not present

## 2021-12-31 DIAGNOSIS — I700 Atherosclerosis of aorta: Secondary | ICD-10-CM | POA: Diagnosis not present

## 2021-12-31 DIAGNOSIS — I2510 Atherosclerotic heart disease of native coronary artery without angina pectoris: Secondary | ICD-10-CM | POA: Diagnosis not present

## 2021-12-31 DIAGNOSIS — I129 Hypertensive chronic kidney disease with stage 1 through stage 4 chronic kidney disease, or unspecified chronic kidney disease: Secondary | ICD-10-CM | POA: Diagnosis not present

## 2021-12-31 DIAGNOSIS — Z433 Encounter for attention to colostomy: Secondary | ICD-10-CM | POA: Diagnosis not present

## 2021-12-31 DIAGNOSIS — N183 Chronic kidney disease, stage 3 unspecified: Secondary | ICD-10-CM

## 2021-12-31 DIAGNOSIS — D631 Anemia in chronic kidney disease: Secondary | ICD-10-CM | POA: Diagnosis not present

## 2021-12-31 DIAGNOSIS — E1122 Type 2 diabetes mellitus with diabetic chronic kidney disease: Secondary | ICD-10-CM | POA: Diagnosis not present

## 2021-12-31 NOTE — Progress Notes (Signed)
MRN : [id number]  Katelyn Simmons is a 76 y.o. (04/29/1945) female who presents with chief complaint of check circulation.  History of Present Illness:  The patient is seen for evaluation of painful lower extremities. Patient notes the pain is variable and not always associated with activity.  The pain is somewhat consistent day to day occurring on most days. The patient notes the pain also occurs with standing and routinely seems worse as the day wears on. The pain has been progressive over the past several years. The patient states these symptoms are causing  a negative impact on quality of life and daily activities which was a factor in the referral.  The patient has a  history of back problems and DJD of the lumbar and sacral spine.   The patient denies rest pain or dangling of an extremity off the side of the bed during the night for relief. No open wounds or sores at this time. No prior vascular interventions or surgeries.  Patient is also noting leg swelling associated with some hyperpigmentation. The patient first noticed the swelling remotely but is now concerned because of a significant increase in the overall edema. The swelling isn't associated with significant pain.  There has been an increasing amount of  discoloration noted by the patient. The patient notes that in the morning the legs are improved but they steadily worsened throughout the course of the day. Elevation seems to make the swelling of the legs better, dependency makes them much worse.   There is no history of ulcerations associated with the swelling.   The patient denies any recent changes in their medications.  The patient has not been wearing graduated compression.  The patient has no had any past angiography, interventions or vascular surgery.  The patient denies a history of DVT or PE. There is no prior history of phlebitis. There is no history of primary lymphedema.  ABI's are normal  bilaterally  No outpatient medications have been marked as taking for the 12/31/21 encounter (Appointment) with Delana Meyer, Dolores Lory, MD.    Past Medical History:  Diagnosis Date   Abnormal finding on EKG    Anemia    pernicious anemia   Anxiety    Arthritis    everywhere   B12 deficiency    Barrett's esophagus    CKD (chronic kidney disease)    stage III ckd   Collagen vascular disease ([place])    RA   Depression    Diabetes mellitus without complication ([place])    Dysphagia    Dyspnea    with exertion   Dysrhythmia    sinus arrythmia or just stops momentarily, per patient   Esophageal stricture    Fatty liver    GERD (gastroesophageal reflux disease)    Goiter, nontoxic, multinodular    History of kidney stones    Hx of repair of left rotator cuff    Hyperlipidemia    Hypertension    Hypothyroidism    Neuromuscular disorder (HCC)    fibromyalgia   Obesity    Pain    Rotator cuff tear arthropathy of both shoulders    Shingles 2008   Stroke [facility])    mini strokes about 20 years ago.  some memory loss residual   Throat pain     Past Surgical History:  Procedure Laterality Date   ABDOMINAL HYSTERECTOMY     CHOLECYSTECTOMY  COLECTOMY WITH COLOSTOMY CREATION/HARTMANN PROCEDURE N/A 11/20/2021   Procedure: COLECTOMY WITH COLOSTOMY CREATION/HARTMANN PROCEDURE;  Surgeon: Olean Ree, MD;  Location: [facility] ORS;  Service: General;  Laterality: N/A;   endoscopic carpal tunnel release Bilateral    ESOPHAGOGASTRODUODENOSCOPY     ESOPHAGOGASTRODUODENOSCOPY (EGD) WITH PROPOFOL N/A 03/31/2016   Procedure: ESOPHAGOGASTRODUODENOSCOPY (EGD) WITH PROPOFOL;  Surgeon: Manya Silvas, MD;  Location: [facility] ENDOSCOPY;  Service: Endoscopy;  Laterality: N/A;   LUMBAR LAMINECTOMY/DECOMPRESSION MICRODISCECTOMY N/A 10/25/2018   Procedure: LUMBAR LAMINECTOMY/DECOMPRESSION MICRODISCECTOMY 2 LEVELS L3-5;  Surgeon: Meade Maw, MD;  Location: [facility] ORS;  Service: Neurosurgery;  Laterality:  N/A;   UPPER GI ENDOSCOPY  10/2018   light run    Social History Social History   Tobacco Use   Smoking status: Never   Smokeless tobacco: Never  Vaping Use   Vaping Use: Never used  Substance Use Topics   Alcohol use: Yes    Comment: rare   Drug use: No    Family History Family History  Problem Relation Age of Onset   Breast cancer Neg Hx     Allergies  Allergen Reactions   Sulfa Antibiotics Swelling    Swelling of face   Eggs Or Egg-Derived Products Nausea And Vomiting   Methocarbamol Other (See Comments)    Lips felt swollen, but were not   Parafon Forte Dsc [Chlorzoxazone] Other (See Comments)    Unknown reaction. Patient does not remember taking this medication   Sulfur Dioxide Other (See Comments)   Tizanidine Other (See Comments)   Codeine Other (See Comments)    Jittery     REVIEW OF SYSTEMS (Negative unless checked)  Constitutional: '[]'$ Weight loss  '[]'$ Fever  '[]'$ Chills Cardiac: '[]'$ Chest pain   '[]'$ Chest pressure   '[]'$ Palpitations   '[]'$ Shortness of breath when laying flat   '[]'$ Shortness of breath with exertion. Vascular:  '[x]'$ Pain in legs with walking   '[]'$ Pain in legs at rest  '[]'$ History of DVT   '[]'$ Phlebitis   '[]'$ Swelling in legs   '[]'$ Varicose veins   '[]'$ Non-healing ulcers Pulmonary:   '[]'$ Uses home oxygen   '[]'$ Productive cough   '[]'$ Hemoptysis   '[]'$ Wheeze  '[]'$ COPD   '[]'$ Asthma Neurologic:  '[]'$ Dizziness   '[]'$ Seizures   '[]'$ History of stroke   '[]'$ History of TIA  '[]'$ Aphasia   '[]'$ Vissual changes   '[]'$ Weakness or numbness in arm   '[]'$ Weakness or numbness in leg Musculoskeletal:   '[]'$ Joint swelling   '[]'$ Joint pain   '[]'$ Low back pain Hematologic:  '[]'$ Easy bruising  '[]'$ Easy bleeding   '[]'$ Hypercoagulable state   '[]'$ Anemic Gastrointestinal:  '[]'$ Diarrhea   '[]'$ Vomiting  '[]'$ Gastroesophageal reflux/heartburn   '[]'$ Difficulty swallowing. Genitourinary:  '[]'$ Chronic kidney disease   '[]'$ Difficult urination  '[]'$ Frequent urination   '[]'$ Blood in urine Skin:  '[]'$ Rashes   '[]'$ Ulcers  Psychological:  '[]'$ History of anxiety   '[]'$   History of major depression.  Physical Examination  There were no vitals filed for this visit. There is no height or weight on file to calculate BMI. Gen: WD/WN, NAD Head: [place]/AT, No temporalis wasting.  Ear/Nose/Throat: Hearing grossly intact, nares w/o erythema or drainage Eyes: PER, EOMI, sclera nonicteric.  Neck: Supple, no masses.  No bruit or JVD.  Pulmonary:  Good air movement, no audible wheezing, no use of accessory muscles.  Cardiac: RRR, normal S1, S2, no Murmurs. Vascular:   scattered varicosities present bilaterally.  Mild venous stasis changes to the legs bilaterally.  2+ soft pitting edema Vessel Right Left  Radial Palpable Palpable  PT  Palpable Palpable  DP  Palpable  Palpable  Gastrointestinal: soft, non-distended. No guarding/no peritoneal signs.  Musculoskeletal: M/S 5/5 throughout.  No visible deformity.  Neurologic: CN 2-12 intact. Pain and light touch intact in extremities.  Symmetrical.  Speech is fluent. Motor exam as listed above. Psychiatric: Judgment intact, Mood & affect appropriate for pt's clinical situation. Dermatologic: Venous rashes or ulcers noted.  No changes consistent with cellulitis.   CBC Lab Results  Component Value Date   WBC 10.3 12/11/2021   HGB 8.3 (L) 12/11/2021   HCT 24.9 (L) 12/11/2021   MCV 89.9 12/11/2021   PLT 503 (H) 12/11/2021    BMET    Component Value Date/Time   NA 138 12/11/2021 2335   NA 141 03/06/2014 0358   K 4.3 12/11/2021 2335   K 3.8 03/06/2014 0358   CL 106 12/11/2021 2335   CL 106 03/06/2014 0358   CO2 26 12/11/2021 2335   CO2 27 03/06/2014 0358   GLUCOSE 153 (H) 12/11/2021 2335   GLUCOSE 185 (H) 03/06/2014 0358   BUN 12 12/11/2021 2335   BUN 12 03/06/2014 0358   CREATININE 0.83 12/11/2021 2335   CREATININE 1.00 03/06/2014 0358   CALCIUM 8.7 (L) 12/11/2021 2335   CALCIUM 8.7 03/06/2014 0358   GFRNONAA >60 12/11/2021 2335   GFRNONAA 59 (L) 03/06/2014 0358   GFRAA 50 (L) 10/16/2018 0915   GFRAA >60  03/06/2014 0358   Estimated Creatinine Clearance: 55.6 mL/min (by C-G formula based on SCr of 0.83 mg/dL).  COAG Lab Results  Component Value Date   INR 1.0 06/17/2021   INR 1.0 10/16/2018    Radiology CT ABDOMEN PELVIS W CONTRAST  Result Date: 12/12/2021 CLINICAL DATA:  Postoperative infection. EXAM: CT ABDOMEN AND PELVIS WITH CONTRAST TECHNIQUE: Multidetector CT imaging of the abdomen and pelvis was performed using the standard protocol following bolus administration of intravenous contrast. RADIATION DOSE REDUCTION: This exam was performed according to the departmental dose-optimization program which includes automated exposure control, adjustment of the mA and/or kV according to patient size and/or use of iterative reconstruction technique. CONTRAST:  140m OMNIPAQUE IOHEXOL 300 MG/ML  SOLN COMPARISON:  CT abdomen pelvis dated 11/18/2021. FINDINGS: Lower chest: The visualized lung bases are clear. There is coronary vascular calcification. No intra-abdominal free air or free fluid. Hepatobiliary: The liver is unremarkable. There is mild biliary dilatation, post cholecystectomy. No retained calcified stone noted in the central CBD. Pancreas: Unremarkable. No pancreatic ductal dilatation or surrounding inflammatory changes. Spleen: Normal in size without focal abnormality. Adrenals/Urinary Tract: The adrenal glands are unremarkable. Mild bilateral renal parenchyma atrophy. There is no hydronephrosis on either side. There is symmetric enhancement and excretion of contrast by both kidneys. The visualized ureters and urinary bladder appear unremarkable. Stomach/Bowel: Postsurgical changes of distal colectomy with left lower quadrant colostomy. There is distal colonic diverticulosis without active inflammatory changes. Mildly dilated small bowel loops in the anterior abdomen to the right of the midline measure up to 3.2 cm in caliber and may represent postobstructive ileus. Obstruction is less likely but  not excluded clinical correlation is recommended the appendix is normal. Vascular/Lymphatic: Mild aortoiliac atherosclerotic disease. The IVC is unremarkable. No portal venous gas. There is no adenopathy. Reproductive: Hysterectomy.  No adnexal masses. Other: There is stranding of the mesentery predominantly in the right lower quadrant, likely postoperative. No drainable fluid collection. There is a 2.2 x 3.9 cm ovoid inflammatory or indurated area in the region of the right rectus sheath (axial 68/2 and coronal 20/5) which may represent edema or small  hematoma. There is mild diffuse subcutaneous edema. Musculoskeletal: Osteopenia with degenerative changes of the spine. No acute osseous pathology. IMPRESSION: 1. Postsurgical changes of distal colectomy with left lower quadrant colostomy. 2. Mildly dilated small bowel loops in the anterior abdomen may represent postobstructive ileus. Obstruction is less likely but not excluded. 3. Colonic diverticulosis. 4. Possible small hematoma involving the right rectus sheath. No drainable fluid collection or abscess. 5.  Aortic Atherosclerosis (ICD10-I70.0). Electronically Signed   By: Anner Crete M.D.   On: 12/12/2021 03:13     Assessment/Plan 1. Pain of lower extremity, unspecified laterality Recommend:  The patient has atypical pain symptoms for vascular disease and on exam I do not find evidence of vascular pathology that would explain the patient's symptoms.  Noninvasive studies do not identify significant vascular problems  I suspect the patient is c/o pseudoclaudication.  Patient should have an evaluation of the LS spine which I defer to the primary service or the Spine service.  The patient should continue walking and begin a more formal exercise program. The patient should continue his antiplatelet therapy and aggressive treatment of the lipid abnormalities.  Patient will follow-up with me on a PRN basis.  2. Essential hypertension Continue  antihypertensive medications as already ordered, these medications have been reviewed and there are no changes at this time.  3. Atherosclerosis of native artery of lower extremity with intermittent claudication, unspecified laterality ([place]) Recommend:  I do not find evidence of life style limiting vascular disease. The patient specifically denies life style limitation.  Previous noninvasive studies including ABI's of the legs do not identify critical vascular problems.  The patient should continue walking and begin a more formal exercise program. The patient should continue his antiplatelet therapy and aggressive treatment of the lipid abnormalities.  The patient is instructed to call the office if there is a significant change in the lower extremity symptoms, particularly if a wound develops or there is an abrupt increase in leg pain.  Patient will follow-up with me on a PRN basis  4. Type 2 diabetes mellitus with peripheral neuropathy (HCC) Continue hypoglycemic medications as already ordered, these medications have been reviewed and there are no changes at this time.  Hgb A1C to be monitored as already arranged by primary service  5. Stage 3 chronic kidney disease, unspecified whether stage 3a or 3b CKD ([place]) The patient has advanced renal disease.  However, at the present time the patient is not yet on dialysis.  Avoid nephrotoxic medications and dehydration.  Further plans per nephrology  6. Arthralgia of both lower legs Recommend:  The patient has atypical pain symptoms for vascular disease and on exam I do not find evidence of vascular pathology that would explain the patient's symptoms.  Noninvasive studies do not identify significant vascular problems  I suspect the patient is c/o pseudoclaudication.  Patient should have an evaluation of the LS spine which I defer to the primary service or the Spine service.  The patient should continue walking and begin a more formal  exercise program. The patient should continue his antiplatelet therapy and aggressive treatment of the lipid abnormalities.  Patient will follow-up with me on a PRN basis. - VAS [place] ABI WITH/WO TBI    Hortencia Pilar, MD  12/31/2021 12:33 PM

## 2022-01-04 DIAGNOSIS — Z433 Encounter for attention to colostomy: Secondary | ICD-10-CM | POA: Diagnosis not present

## 2022-01-04 DIAGNOSIS — Z48815 Encounter for surgical aftercare following surgery on the digestive system: Secondary | ICD-10-CM | POA: Diagnosis not present

## 2022-01-04 DIAGNOSIS — I2510 Atherosclerotic heart disease of native coronary artery without angina pectoris: Secondary | ICD-10-CM | POA: Diagnosis not present

## 2022-01-04 DIAGNOSIS — D631 Anemia in chronic kidney disease: Secondary | ICD-10-CM | POA: Diagnosis not present

## 2022-01-04 DIAGNOSIS — I129 Hypertensive chronic kidney disease with stage 1 through stage 4 chronic kidney disease, or unspecified chronic kidney disease: Secondary | ICD-10-CM | POA: Diagnosis not present

## 2022-01-04 DIAGNOSIS — E1122 Type 2 diabetes mellitus with diabetic chronic kidney disease: Secondary | ICD-10-CM | POA: Diagnosis not present

## 2022-01-04 DIAGNOSIS — N183 Chronic kidney disease, stage 3 unspecified: Secondary | ICD-10-CM | POA: Diagnosis not present

## 2022-01-12 ENCOUNTER — Encounter: Payer: Self-pay | Admitting: Gastroenterology

## 2022-01-13 ENCOUNTER — Encounter: Payer: Self-pay | Admitting: Gastroenterology

## 2022-01-13 ENCOUNTER — Ambulatory Visit
Admission: RE | Admit: 2022-01-13 | Discharge: 2022-01-13 | Disposition: A | Discharge status: Home / Self Care | Payer: Medicare HMO | Source: Ambulatory Visit | Attending: Gastroenterology | Admitting: Gastroenterology

## 2022-01-13 ENCOUNTER — Ambulatory Visit: Payer: Medicare HMO | Admitting: Registered Nurse

## 2022-01-13 ENCOUNTER — Encounter
Admission: RE | Disposition: A | Discharge status: Home / Self Care | Payer: Self-pay | Source: Ambulatory Visit | Attending: Gastroenterology

## 2022-01-13 DIAGNOSIS — Z7902 Long term (current) use of antithrombotics/antiplatelets: Secondary | ICD-10-CM | POA: Diagnosis not present

## 2022-01-13 DIAGNOSIS — K449 Diaphragmatic hernia without obstruction or gangrene: Secondary | ICD-10-CM | POA: Diagnosis not present

## 2022-01-13 DIAGNOSIS — Z6830 Body mass index (BMI) 30.0-30.9, adult: Secondary | ICD-10-CM | POA: Insufficient documentation

## 2022-01-13 DIAGNOSIS — Z933 Colostomy status: Secondary | ICD-10-CM | POA: Diagnosis not present

## 2022-01-13 DIAGNOSIS — K219 Gastro-esophageal reflux disease without esophagitis: Secondary | ICD-10-CM | POA: Insufficient documentation

## 2022-01-13 DIAGNOSIS — F419 Anxiety disorder, unspecified: Secondary | ICD-10-CM | POA: Diagnosis not present

## 2022-01-13 DIAGNOSIS — E039 Hypothyroidism, unspecified: Secondary | ICD-10-CM | POA: Diagnosis not present

## 2022-01-13 DIAGNOSIS — E669 Obesity, unspecified: Secondary | ICD-10-CM | POA: Insufficient documentation

## 2022-01-13 DIAGNOSIS — Z01818 Encounter for other preprocedural examination: Secondary | ICD-10-CM | POA: Insufficient documentation

## 2022-01-13 DIAGNOSIS — E1122 Type 2 diabetes mellitus with diabetic chronic kidney disease: Secondary | ICD-10-CM | POA: Insufficient documentation

## 2022-01-13 DIAGNOSIS — N183 Chronic kidney disease, stage 3 unspecified: Secondary | ICD-10-CM | POA: Insufficient documentation

## 2022-01-13 DIAGNOSIS — M797 Fibromyalgia: Secondary | ICD-10-CM | POA: Insufficient documentation

## 2022-01-13 DIAGNOSIS — Z87891 Personal history of nicotine dependence: Secondary | ICD-10-CM | POA: Diagnosis not present

## 2022-01-13 DIAGNOSIS — Z79899 Other long term (current) drug therapy: Secondary | ICD-10-CM | POA: Diagnosis not present

## 2022-01-13 DIAGNOSIS — K2270 Barrett's esophagus without dysplasia: Secondary | ICD-10-CM | POA: Insufficient documentation

## 2022-01-13 DIAGNOSIS — K635 Polyp of colon: Secondary | ICD-10-CM | POA: Diagnosis not present

## 2022-01-13 DIAGNOSIS — Z79890 Hormone replacement therapy (postmenopausal): Secondary | ICD-10-CM | POA: Insufficient documentation

## 2022-01-13 DIAGNOSIS — Z8719 Personal history of other diseases of the digestive system: Secondary | ICD-10-CM | POA: Diagnosis not present

## 2022-01-13 DIAGNOSIS — E785 Hyperlipidemia, unspecified: Secondary | ICD-10-CM | POA: Diagnosis not present

## 2022-01-13 DIAGNOSIS — E1151 Type 2 diabetes mellitus with diabetic peripheral angiopathy without gangrene: Secondary | ICD-10-CM | POA: Diagnosis not present

## 2022-01-13 DIAGNOSIS — K760 Fatty (change of) liver, not elsewhere classified: Secondary | ICD-10-CM | POA: Insufficient documentation

## 2022-01-13 DIAGNOSIS — D122 Benign neoplasm of ascending colon: Secondary | ICD-10-CM | POA: Diagnosis not present

## 2022-01-13 DIAGNOSIS — F32A Depression, unspecified: Secondary | ICD-10-CM | POA: Diagnosis not present

## 2022-01-13 DIAGNOSIS — I129 Hypertensive chronic kidney disease with stage 1 through stage 4 chronic kidney disease, or unspecified chronic kidney disease: Secondary | ICD-10-CM | POA: Insufficient documentation

## 2022-01-13 HISTORY — PX: ESOPHAGOGASTRODUODENOSCOPY (EGD) WITH PROPOFOL: SHX5813

## 2022-01-13 HISTORY — PX: COLONOSCOPY WITH PROPOFOL: SHX5780

## 2022-01-13 LAB — GLUCOSE, CAPILLARY: Glucose-Capillary: 93 mg/dL (ref 70–99)

## 2022-01-13 SURGERY — COLONOSCOPY WITH PROPOFOL
Anesthesia: General

## 2022-01-13 MED ORDER — DEXMEDETOMIDINE HCL IN NACL 80 MCG/20ML IV SOLN
INTRAVENOUS | Status: DC | PRN
  Administered 2022-01-13: 8 ug via INTRAVENOUS

## 2022-01-13 MED ORDER — SODIUM CHLORIDE 0.9 % IV SOLN
INTRAVENOUS | Status: DC
  Administered 2022-01-13: 1000 mL via INTRAVENOUS

## 2022-01-13 MED ORDER — PROPOFOL 10 MG/ML IV BOLUS
INTRAVENOUS | Status: AC
  Filled 2022-01-13: qty 20

## 2022-01-13 MED ORDER — PROPOFOL 500 MG/50ML IV EMUL
INTRAVENOUS | Status: DC | PRN
  Administered 2022-01-13: 140 ug/kg/min via INTRAVENOUS

## 2022-01-13 MED ORDER — PROPOFOL 10 MG/ML IV BOLUS
INTRAVENOUS | Status: DC | PRN
  Administered 2022-01-13: 30 mg via INTRAVENOUS
  Administered 2022-01-13: 70 mg via INTRAVENOUS

## 2022-01-13 MED ORDER — LIDOCAINE HCL (CARDIAC) PF 100 MG/5ML IV SOSY
PREFILLED_SYRINGE | INTRAVENOUS | Status: DC | PRN
  Administered 2022-01-13: 60 mg via INTRAVENOUS

## 2022-01-13 MED ORDER — LIDOCAINE HCL (PF) 2 % IJ SOLN
INTRAMUSCULAR | Status: AC
  Filled 2022-01-13: qty 5

## 2022-01-13 NOTE — Op Note (Signed)
[facility]astro[facility]ology Patient Name: Katelyn Simmons Procedure Date: 01/13/2022 9:19 AM MRN: [id number] Account #: [id number] Date of Birth: 10-17-1945 Admit Type: Outpatient Age: 76 Room: [facility] ENDO ROOM 4 Gender: Female Note Status: Finalized Instrument Name: Upper Endoscope [id number] Procedure:             Upper GI endoscopy Indications:           Follow-up of Barrett's esophagus, Surveillance for                         malignancy due to personal history of Barrett's                         esophagus Providers:             Lin Landsman MD, MD Referring MD:          Leonie Douglas. Doy Hutching, MD (Referring MD) Medicines:             General Anesthesia Complications:         No immediate complications. Estimated blood loss:                         Minimal. Procedure:             Pre-Anesthesia Assessment:                        - Prior to the procedure, a History and Physical was                         performed, and patient medications and allergies were                         reviewed. The patient is competent. The risks and                         benefits of the procedure and the sedation options and                         risks were discussed with the patient. All questions                         were answered and informed consent was obtained.                         Patient identification and proposed procedure were                         verified by the physician, the nurse, the                         anesthesiologist, the anesthetist and the technician                         in the pre-procedure area in the procedure room in the                         endoscopy suite. Mental Status Examination: alert and  oriented. Airway Examination: normal oropharyngeal                         airway and neck mobility. Respiratory Examination:                         clear to auscultation. CV Examination: normal.                          Prophylactic Antibiotics: The patient does not require                         prophylactic antibiotics. Prior Anticoagulants: The                         patient has taken Plavix (clopidogrel), last dose was                         5 days prior to procedure. ASA Grade Assessment: III -                         A patient with severe systemic disease. After                         reviewing the risks and benefits, the patient was                         deemed in satisfactory condition to undergo the                         procedure. The anesthesia plan was to use general                         anesthesia. Immediately prior to administration of                         medications, the patient was re-assessed for adequacy                         to receive sedatives. The heart rate, respiratory                         rate, oxygen saturations, blood pressure, adequacy of                         pulmonary ventilation, and response to care were                         monitored throughout the procedure. The physical                         status of the patient was re-assessed after the                         procedure.                        After obtaining informed consent, the endoscope was  passed under direct vision. Throughout the procedure,                         the patient's blood pressure, pulse, and oxygen                         saturations were monitored continuously. The Endoscope                         was introduced through the mouth, and advanced to the                         second part of duodenum. The upper GI endoscopy was                         accomplished without difficulty. The patient tolerated                         the procedure well. Findings:      The duodenal bulb and second portion of the duodenum were normal.      The entire examined stomach was normal.      The cardia and gastric fundus were normal on retroflexion.      A small  hiatal hernia was present.      There were esophageal mucosal changes secondary to established       short-segment Barrett's disease present in the lower third of the       esophagus represented as a tongue. The maximum longitudinal extent of       these mucosal changes was 1 cm in length. Biopsies were taken with a       cold forceps for histology. Estimated blood loss was minimal.      Esophagogastric landmarks were identified: the gastroesophageal junction       was found at 35 cm from the incisors. Impression:            - Normal duodenal bulb and second portion of the                         duodenum.                        - Normal stomach.                        - Small hiatal hernia.                        - Esophageal mucosal changes secondary to established                         short-segment Barrett's disease. Biopsied.                        - Esophagogastric landmarks identified. Recommendation:        - Await pathology results.                        - Repeat upper endoscopy in 5 years for surveillance  of Barrett's esophagus.                        - Proceed with colonoscopy as scheduled                        See colonoscopy report                        - Follow an antireflux regimen indefinitely.                        - Use a proton pump inhibitor PO daily indefinitely. Procedure Code(s):     --- Professional ---                        [phone], Esophagogastroduodenoscopy, flexible,                         transoral; with biopsy, single or multiple Diagnosis Code(s):     --- Professional ---                        K22.70, Barrett's esophagus without dysplasia                        K44.9, Diaphragmatic hernia without obstruction or                         gangrene CPT copyright 2022 American [employer] Association. All rights reserved. The codes documented in this report are preliminary and upon coder review may  be revised to meet current  compliance requirements. Dr. Ulyess Mort Lin Landsman MD, MD 01/13/2022 9:36:18 AM This report has been signed electronically. Number of Addenda: 0 Note Initiated On: 01/13/2022 9:19 AM Estimated Blood Loss:  Estimated blood loss was minimal.      [facility]

## 2022-01-13 NOTE — Transfer of Care (Signed)
Immediate Anesthesia Transfer of Care Note  Patient: Katelyn Simmons  Procedure(s) Performed: COLONOSCOPY WITH PROPOFOL ESOPHAGOGASTRODUODENOSCOPY (EGD) WITH PROPOFOL  Patient Location: PACU and Endoscopy Unit  Anesthesia Type:General  Level of Consciousness: drowsy and patient cooperative  Airway & Oxygen Therapy: Patient Spontanous Breathing  Post-op Assessment: Report given to RN and Post -op Vital signs reviewed and stable  Post vital signs: Reviewed and stable  Last Vitals:  Vitals Value Taken Time  BP 112/69 01/13/22 1003  Temp 36 C 01/13/22 1002  Pulse 76 01/13/22 1002  Resp 18 01/13/22 1003  SpO2 95 % 01/13/22 1003    Last Pain:  Vitals:   01/13/22 1002  TempSrc: Temporal  PainSc: 0-No pain         Complications: No notable events documented.

## 2022-01-13 NOTE — H&P (Signed)
Katelyn Darby, MD [address]  [place]  [place], [place] [address]  Main: [phone]  Fax: [phone] Pager: [phone]  Primary Care Physician:  Idelle Crouch, MD Primary Gastroenterologist:  Dr. Cephas Simmons  Pre-Procedure History & Physical: HPI:  Katelyn Simmons is a 76 y.o. female is here for an endoscopy and colonoscopy.   Past Medical History:  Diagnosis Date   Abnormal finding on EKG    Anemia    pernicious anemia   Anxiety    Arthritis    everywhere   B12 deficiency    Barrett's esophagus    CKD (chronic kidney disease)    stage III ckd   Collagen vascular disease (HCC)    RA   Depression    Diabetes mellitus without complication (HCC)    Dysphagia    Dyspnea    with exertion   Dysrhythmia    sinus arrythmia or just stops momentarily, per patient   Esophageal stricture    Fatty liver    GERD (gastroesophageal reflux disease)    Goiter, nontoxic, multinodular    History of kidney stones    Hx of repair of left rotator cuff    Hyperlipidemia    Hypertension    Hypothyroidism    Neuromuscular disorder (HCC)    fibromyalgia   Obesity    Pain    Rotator cuff tear arthropathy of both shoulders    Shingles 2008   Stroke [facility])    mini strokes about 20 years ago.  some memory loss residual   Throat pain     Past Surgical History:  Procedure Laterality Date   ABDOMINAL HYSTERECTOMY     BACK SURGERY     CHOLECYSTECTOMY     COLECTOMY WITH COLOSTOMY CREATION/HARTMANN PROCEDURE N/A 11/20/2021   Procedure: COLECTOMY WITH COLOSTOMY CREATION/HARTMANN PROCEDURE;  Surgeon: Olean Ree, MD;  Location: [facility] ORS;  Service: General;  Laterality: N/A;   COLON SURGERY     endoscopic carpal tunnel release Bilateral    ESOPHAGOGASTRODUODENOSCOPY     ESOPHAGOGASTRODUODENOSCOPY (EGD) WITH PROPOFOL N/A 03/31/2016   Procedure: ESOPHAGOGASTRODUODENOSCOPY (EGD) WITH PROPOFOL;  Surgeon: Manya Silvas, MD;  Location: [facility] ENDOSCOPY;  Service:  Endoscopy;  Laterality: N/A;   LUMBAR LAMINECTOMY/DECOMPRESSION MICRODISCECTOMY N/A 10/25/2018   Procedure: LUMBAR LAMINECTOMY/DECOMPRESSION MICRODISCECTOMY 2 LEVELS L3-5;  Surgeon: Meade Maw, MD;  Location: [facility] ORS;  Service: Neurosurgery;  Laterality: N/A;   UPPER GI ENDOSCOPY  10/2018   light run    Prior to Admission medications   Medication Sig Start Date End Date Taking? Authorizing Provider  beta carotene w/minerals (OCUVITE) tablet Take 1 tablet by mouth daily.   Yes [provider]  Blood Glucose Monitoring Suppl (GMATE SMART METER KIT) DEVI by Does not apply route.   Yes [provider]  busPIRone (BUSPAR) 10 MG tablet Take 10 mg by mouth 2 (two) times daily.   Yes [provider]  Cholecalciferol 25 MCG (1000 UT) capsule Take 1,000 Units by mouth every evening.    Yes [provider]  feeding supplement, GLUCERNA SHAKE, (GLUCERNA SHAKE) LIQD Take 237 mLs by mouth 3 (three) times daily between meals. 11/26/21  Yes Wieting, Richard, MD  fenofibrate 160 MG tablet Take 160 mg by mouth at bedtime.    Yes [provider]  glucose blood test strip 1 each by Other route as needed for other. Use as instructed   Yes [provider]  levothyroxine (SYNTHROID) 125 MCG tablet Take 125 mcg by mouth  daily before breakfast. 11/27/18  Yes [provider]  liothyronine (CYTOMEL) 5 MCG tablet Take 5 mcg by mouth daily.   Yes [provider]  methotrexate (RHEUMATREX) 2.5 MG tablet Take 20 mg by mouth once a week. 12/20/21  Yes [provider]  metoprolol succinate (TOPROL-XL) 100 MG 24 hr tablet Take 100 mg by mouth daily. Take with or immediately following a meal.   Yes [provider]  Misc Natural Products (LUTEIN 20 PO) Take 20 mg by mouth daily.   Yes [provider]  pantoprazole (PROTONIX) 40 MG tablet Take 40 mg by mouth daily.   Yes [provider]  pioglitazone (ACTOS) 45 MG  tablet Take 45 mg by mouth daily.   Yes [provider]  QUEtiapine (SEROQUEL) 50 MG tablet Take 50 mg by mouth at bedtime.   Yes [provider]  rosuvastatin (CRESTOR) 10 MG tablet Take 10 mg by mouth at bedtime.   Yes [provider]  venlafaxine XR (EFFEXOR-XR) 150 MG 24 hr capsule Take 150 mg by mouth daily with breakfast. 11/02/21  Yes [provider]  vitamin B-12 (CYANOCOBALAMIN) 1000 MCG tablet Take 1,000 mcg by mouth daily.   Yes [provider]  acetaminophen (TYLENOL) 500 MG tablet Take 2 tablets (1,000 mg total) by mouth every 6 (six) hours as needed for mild pain or fever. 11/26/21   Loletha Grayer, MD    Allergies as of 12/23/2021 - Review Complete 12/23/2021  Allergen Reaction Noted   Sulfa antibiotics Swelling 10/16/2015   Eggs or egg-derived products Nausea And Vomiting 10/16/2015   Methocarbamol Other (See Comments) 11/23/2018   Parafon forte dsc [chlorzoxazone] Other (See Comments) 03/30/2016   Sulfur dioxide Other (See Comments) 12/16/2021   Tizanidine Other (See Comments) 08/10/2019   Codeine Other (See Comments) 10/16/2015    Family History  Problem Relation Age of Onset   Breast cancer Neg Hx     Social History   Socioeconomic History   Marital status: Widowed    Spouse name: Jimmie   Number of children: Not on file   Years of education: Not on file   Highest education level: Not on file  Occupational History   Occupation: Management consultant, owned [employer]  Tobacco Use   Smoking status: Never   Smokeless tobacco: Never  Vaping Use   Vaping Use: Never used  Substance and Sexual Activity   Alcohol use: Yes    Comment: rare   Drug use: No   Sexual activity: Not on file  Other Topics Concern   Not on file  Social History Narrative   Not on file   Social Determinants of Health   Financial Resource Strain: Medium Risk (10/27/2018)   Overall Financial Resource Strain (CARDIA)    Difficulty of Paying Living  Expenses: Somewhat hard  Food Insecurity: Food Insecurity Present (11/19/2021)   Hunger Vital Sign    Worried About [place] in the Last Year: Sometimes true    Ran Out of Food in the Last Year: Sometimes true  Transportation Needs: No Transportation Needs (11/19/2021)   PRAPARE - Hydrologist (Medical): No    Lack of Transportation (Non-Medical): No  Physical Activity: Not on file  Stress: Not on file  Social Connections: Not on file  Intimate Partner Violence: Not At Risk (11/19/2021)   Humiliation, Afraid, Rape, and Kick questionnaire    Fear of Current or Ex-Partner: No    Emotionally Abused: No  Physically Abused: No    Sexually Abused: No    Review of Systems: See HPI, otherwise negative ROS  Physical Exam: BP (!) 145/59   Pulse 72   Temp (!) 96.4 F (35.8 C) (Temporal)   Resp 16   Ht _0  (1.575 m)   Wt 75.4 kg   SpO2 99%   BMI 30.42 kg/m  General:   Alert,  pleasant and cooperative in NAD Head:  Normocephalic and atraumatic. Neck:  Supple; no masses or thyromegaly. Lungs:  Clear throughout to auscultation.    Heart:  Regular rate and rhythm. Abdomen:  Soft, nontender and nondistended. Normal bowel sounds, without guarding, and without rebound.   Neurologic:  Alert and  oriented x4;  grossly normal neurologically.  Impression/Plan: Kimberlynn L Norsworthy is here for an endoscopy and colonoscopy to be performed for history of large bowel obstruction secondary to diverticular stricture at the sigmoid colon s/p ex lap and diverting colostomy, Hartman's procedure, history of short segment Barrett's esophagus without dysplasia   Risks, benefits, limitations, and alternatives regarding  endoscopy and colonoscopy have been reviewed with the patient.  Questions have been answered.  All parties agreeable.   Sherri Sear, MD  01/13/2022, 8:35 AM

## 2022-01-13 NOTE — Op Note (Signed)
[facility] Gastroenterology Patient Name: Katelyn Simmons Procedure Date: 01/13/2022 9:18 AM MRN: [id number] Account #: [id number] Date of Birth: 1945-06-23 Admit Type: Outpatient Age: 76 Room: [facility] ENDO ROOM 4 Gender: Female Note Status: Finalized Instrument Name: Jasper Riling [id number] Procedure:             Colonoscopy via Stoma with Endoscopy of Hartmann Pouch Indications:           History of diverticulitis s/p diverting colostomy with                         Jeanette Caprice pouch, Preoperative assessment for colostomy                         take-down, Preoperative assessment for reversal of                         Hartmann pouch Providers:             Lin Landsman MD, MD Referring MD:          Leonie Douglas. Doy Hutching, MD (Referring MD) Medicines:             General Anesthesia Complications:         No immediate complications. Estimated blood loss: None. Procedure:             Pre-Anesthesia Assessment:                        - Prior to the procedure, a History and Physical was                         performed, and patient medications and allergies were                         reviewed. The patient is competent. The risks and                         benefits of the procedure and the sedation options and                         risks were discussed with the patient. All questions                         were answered and informed consent was obtained.                         Patient identification and proposed procedure were                         verified by the physician, the nurse, the                         anesthesiologist, the anesthetist and the technician                         in the pre-procedure area in the procedure room in the                         endoscopy suite. Mental Status Examination: alert and  oriented. Airway Examination: normal oropharyngeal                         airway and neck mobility. Respiratory Examination:                          clear to auscultation. CV Examination: normal.                         Prophylactic Antibiotics: The patient does not require                         prophylactic antibiotics. Prior Anticoagulants: The                         patient has taken Plavix (clopidogrel), last dose was                         5 days prior to procedure. ASA Grade Assessment: III -                         A patient with severe systemic disease. After                         reviewing the risks and benefits, the patient was                         deemed in satisfactory condition to undergo the                         procedure. The anesthesia plan was to use general                         anesthesia. Immediately prior to administration of                         medications, the patient was re-assessed for adequacy                         to receive sedatives. The heart rate, respiratory                         rate, oxygen saturations, blood pressure, adequacy of                         pulmonary ventilation, and response to care were                         monitored throughout the procedure. The physical                         status of the patient was re-assessed after the                         procedure.                        After obtaining informed consent, the endoscope was  passed under direct vision. Throughout the procedure,                         the patient's blood pressure, pulse, and oxygen                         saturations were monitored continuously. The                         Colonoscope was introduced through the anus and                         advanced to the the cecum, identified by appendiceal                         orifice and ileocecal valve. After obtaining informed                         consent, the endoscope was passed under direct vision.                         Throughout the procedure, the patient's blood                          pressure, pulse, and oxygen saturations were monitored                         continuously.The procedure was performed without                         difficulty. The patient tolerated the procedure well.                         The quality of the bowel preparation was fair. Findings:      A 6 mm polyp was found in the ascending colon. The polyp was sessile.       The polyp was removed with a cold snare. Resection and retrieval were       complete. Estimated blood loss: none.      The exam was otherwise without abnormality.      The Hartmann pouch appeared normal. Estimated blood loss: none. Impression:            - Preparation of the colon was fair.                        - One 6 mm polyp in the ascending colon, removed with                         a cold snare. Resected and retrieved.                        - The examination was otherwise normal.                        - The Hartmann pouch is normal. Recommendation:        - Discharge patient to home (with escort).                        -  Resume previous diet today.                        - Resume Plavix (clopidogrel) today at prior dose.                         Refer to managing physician for further adjustment of                         therapy.                        - Await pathology results.                        - Repeat post-surgical lower GI endoscopy in 1 year                         with 2 day prep after colostomy takedown because the                         bowel preparation was suboptimal. Procedure Code(s):     --- Professional ---                        [phone], Colonoscopy through stoma; with removal of                         tumor(s), polyp(s), or other lesion(s) by snare                         technique                        [phone], Colonoscopy, flexible; diagnostic, including                         collection of specimen(s) by brushing or washing, when                         performed (separate  procedure) Diagnosis Code(s):     --- Professional ---                        D12.2, Benign neoplasm of ascending colon                        Z93.3, Colostomy status                        Z01.818, Encounter for other preprocedural examination CPT copyright 2022 American Medical Association. All rights reserved. The codes documented in this report are preliminary and upon coder review may  be revised to meet current compliance requirements. Dr. Ulyess Mort Lin Landsman MD, MD 01/13/2022 9:59:48 AM This report has been signed electronically. Number of Addenda: 0 Note Initiated On: 01/13/2022 9:18 AM Scope Withdrawal Time: 0 hours 14 minutes 10 seconds  Total Procedure Duration: 0 hours 16 minutes 29 seconds  Estimated Blood Loss:  Estimated blood loss: none.      [facility]

## 2022-01-13 NOTE — Anesthesia Preprocedure Evaluation (Signed)
Anesthesia Evaluation  Patient identified by MRN, date of birth, ID band Patient awake    Reviewed: Allergy & Precautions, NPO status , Patient's Chart, lab work & pertinent test results  History of Anesthesia Complications Negative for: history of anesthetic complications  Airway Mallampati: II  TM Distance: >3 FB Neck ROM: Full    Dental no notable dental hx. (+) Teeth Intact   Pulmonary neg pulmonary ROS, neg sleep apnea, neg COPD, Patient abstained from smoking.Not current smoker   Pulmonary exam normal breath sounds clear to auscultation       Cardiovascular Exercise Tolerance: Good METShypertension, + Peripheral Vascular Disease  (-) CAD and (-) Past MI (-) dysrhythmias  Rhythm:Regular Rate:Normal - Systolic murmurs    Neuro/Psych  PSYCHIATRIC DISORDERS Anxiety Depression    CVA, No Residual Symptoms    GI/Hepatic hiatal hernia,GERD  ,,(+)     (-) substance abuse    Endo/Other  diabetesHypothyroidism    Renal/GU CRFRenal disease     Musculoskeletal   Abdominal   Peds  Hematology   Anesthesia Other Findings Past Medical History: No date: Abnormal finding on EKG No date: Anemia     Comment:  pernicious anemia No date: Anxiety No date: Arthritis     Comment:  everywhere No date: B12 deficiency No date: Barrett's esophagus No date: CKD (chronic kidney disease)     Comment:  stage III ckd No date: Collagen vascular disease (HCC)     Comment:  RA No date: Depression No date: Diabetes mellitus without complication (HCC) No date: Dysphagia No date: Dyspnea     Comment:  with exertion No date: Dysrhythmia     Comment:  sinus arrythmia or just stops momentarily, per patient No date: Esophageal stricture No date: Fatty liver No date: GERD (gastroesophageal reflux disease) No date: Goiter, nontoxic, multinodular No date: History of kidney stones No date: Hx of repair of left rotator cuff No date:  Hyperlipidemia No date: Hypertension No date: Hypothyroidism No date: Neuromuscular disorder (HCC)     Comment:  fibromyalgia No date: Obesity No date: Pain No date: Rotator cuff tear arthropathy of both shoulders 2008: Shingles No date: Stroke [facility])     Comment:  mini strokes about 20 years ago.  some memory loss               residual No date: Throat pain  Reproductive/Obstetrics                             Anesthesia Physical Anesthesia Plan  ASA: 3  Anesthesia Plan: General   Post-op Pain Management: Minimal or no pain anticipated   Induction: Intravenous  PONV Risk Score and Plan: 3 and Propofol infusion, TIVA and Ondansetron  Airway Management Planned: Nasal Cannula  Additional Equipment: None  Intra-op Plan:   Post-operative Plan:   Informed Consent: I have reviewed the patients History and Physical, chart, labs and discussed the procedure including the risks, benefits and alternatives for the proposed anesthesia with the patient or authorized representative who has indicated his/her understanding and acceptance.     Dental advisory given  Plan Discussed with: CRNA and Surgeon  Anesthesia Plan Comments: (Discussed risks of anesthesia with patient, including possibility of difficulty with spontaneous ventilation under anesthesia necessitating airway intervention, PONV, and rare risks such as cardiac or respiratory or neurological events, and allergic reactions. Discussed the role of CRNA in patient's perioperative care. Patient understands.)  Anesthesia Quick Evaluation  

## 2022-01-13 NOTE — Anesthesia Postprocedure Evaluation (Signed)
Anesthesia Post Note  Patient: Katelyn Simmons  Procedure(s) Performed: COLONOSCOPY WITH PROPOFOL ESOPHAGOGASTRODUODENOSCOPY (EGD) WITH PROPOFOL  Patient location during evaluation: Endoscopy Anesthesia Type: General Level of consciousness: awake and alert Pain management: pain level controlled Vital Signs Assessment: post-procedure vital signs reviewed and stable Respiratory status: spontaneous breathing, nonlabored ventilation, respiratory function stable and patient connected to nasal cannula oxygen Cardiovascular status: blood pressure returned to baseline and stable Postop Assessment: no apparent nausea or vomiting Anesthetic complications: no   No notable events documented.   Last Vitals:  Vitals:   01/13/22 1003 01/13/22 1013  BP: 112/69 120/85  Pulse: 68 72  Resp: 18 19  Temp: (!) 36 C   SpO2: 95% 100%    Last Pain:  Vitals:   01/13/22 1013  TempSrc:   PainSc: 0-No pain                 Arita Miss

## 2022-01-14 ENCOUNTER — Encounter: Payer: Self-pay | Admitting: Gastroenterology

## 2022-01-14 LAB — SURGICAL PATHOLOGY

## 2022-01-18 DIAGNOSIS — E538 Deficiency of other specified B group vitamins: Secondary | ICD-10-CM | POA: Diagnosis not present

## 2022-02-03 ENCOUNTER — Ambulatory Visit: Payer: Medicare HMO | Admitting: Surgery

## 2022-02-03 ENCOUNTER — Encounter: Payer: Self-pay | Admitting: Surgery

## 2022-02-03 ENCOUNTER — Telehealth: Payer: Self-pay

## 2022-02-03 ENCOUNTER — Other Ambulatory Visit: Payer: Self-pay

## 2022-02-03 VITALS — BP 169/65 | HR 68 | Temp 98.7°F | Ht 62.0 in | Wt 177.0 lb

## 2022-02-03 DIAGNOSIS — Z09 Encounter for follow-up examination after completed treatment for conditions other than malignant neoplasm: Secondary | ICD-10-CM

## 2022-02-03 DIAGNOSIS — K56609 Unspecified intestinal obstruction, unspecified as to partial versus complete obstruction: Secondary | ICD-10-CM

## 2022-02-03 DIAGNOSIS — Z933 Colostomy status: Secondary | ICD-10-CM

## 2022-02-03 DIAGNOSIS — T8149XD Infection following a procedure, other surgical site, subsequent encounter: Secondary | ICD-10-CM

## 2022-02-03 MED ORDER — BISACODYL 5 MG PO TBEC: 5.0000 mg | DELAYED_RELEASE_TABLET | Freq: Once | ORAL | 0 refills | Status: AC

## 2022-02-03 MED ORDER — METRONIDAZOLE 500 MG PO TABS: ORAL_TABLET | ORAL | 0 refills | Status: DC

## 2022-02-03 MED ORDER — POLYETHYLENE GLYCOL 3350 17 GM/SCOOP PO POWD: 1.0000 | Freq: Once | ORAL | 0 refills | Status: AC

## 2022-02-03 MED ORDER — NEOMYCIN SULFATE 500 MG PO TABS: ORAL_TABLET | ORAL | 0 refills | Status: DC

## 2022-02-03 NOTE — Patient Instructions (Signed)
Our surgery scheduler will call you within 24-48 hours to schedule your surgery. Please have the [place] surgery sheet available when speaking with her.   Please follow the Bowel prep sheet instructions.  Pick your medication up at the pharmacy.  You will need to pick up a 64 ounce bottle of Gatorade.

## 2022-02-03 NOTE — Progress Notes (Signed)
02/03/2022  History of Present Illness: Katelyn Simmons is a 77 y.o. female status post Hartman's procedure for sigmoid colonic stricture resulting in a large bowel obstruction on 11/20/2021.  Presents for follow-up.  She was last seen on 12/16/2021.  Since then, the patient had a colonoscopy with Dr. Marius Ditch on 01/13/2022 which showed a small polyp in the ascending colon but otherwise no abnormalities that would preclude colostomy reversal.  The patient reported she has been doing well and denies any abdominal pain issues.  She does report sometimes feeling pressure in the lower abdomen but denies any true pain.  She is ready to have the ostomy reversed.  Of note, she reports that she used to take a baby aspirin but has stopped that due to easy bruising.  Past Medical History: Past Medical History:  Diagnosis Date   Abnormal finding on EKG    Anemia    pernicious anemia   Anxiety    Arthritis    everywhere   B12 deficiency    Barrett's esophagus    CKD (chronic kidney disease)    stage III ckd   Collagen vascular disease (HCC)    RA   Depression    Diabetes mellitus without complication (HCC)    Dysphagia    Dyspnea    with exertion   Dysrhythmia    sinus arrythmia or just stops momentarily, per patient   Esophageal stricture    Fatty liver    GERD (gastroesophageal reflux disease)    Goiter, nontoxic, multinodular    History of kidney stones    Hx of repair of left rotator cuff    Hyperlipidemia    Hypertension    Hypothyroidism    Neuromuscular disorder (HCC)    fibromyalgia   Obesity    Pain    Rotator cuff tear arthropathy of both shoulders    Shingles 2008   Stroke [facility])    mini strokes about 20 years ago.  some memory loss residual   Throat pain      Past Surgical History: Past Surgical History:  Procedure Laterality Date   ABDOMINAL HYSTERECTOMY     BACK SURGERY     CHOLECYSTECTOMY     COLECTOMY WITH COLOSTOMY CREATION/HARTMANN PROCEDURE N/A 11/20/2021    Procedure: COLECTOMY WITH COLOSTOMY CREATION/HARTMANN PROCEDURE;  Surgeon: Olean Ree, MD;  Location: [facility] ORS;  Service: General;  Laterality: N/A;   COLON SURGERY     COLONOSCOPY WITH PROPOFOL N/A 01/13/2022   Procedure: COLONOSCOPY WITH PROPOFOL;  Surgeon: Lin Landsman, MD;  Location: [facility] ENDOSCOPY;  Service: Gastroenterology;  Laterality: N/A;   endoscopic carpal tunnel release Bilateral    ESOPHAGOGASTRODUODENOSCOPY     ESOPHAGOGASTRODUODENOSCOPY (EGD) WITH PROPOFOL N/A 03/31/2016   Procedure: ESOPHAGOGASTRODUODENOSCOPY (EGD) WITH PROPOFOL;  Surgeon: Manya Silvas, MD;  Location: [place];  Service: Endoscopy;  Laterality: N/A;   ESOPHAGOGASTRODUODENOSCOPY (EGD) WITH PROPOFOL N/A 01/13/2022   Procedure: ESOPHAGOGASTRODUODENOSCOPY (EGD) WITH PROPOFOL;  Surgeon: Lin Landsman, MD;  Location: [facility] ENDOSCOPY;  Service: Gastroenterology;  Laterality: N/A;   LUMBAR LAMINECTOMY/DECOMPRESSION MICRODISCECTOMY N/A 10/25/2018   Procedure: LUMBAR LAMINECTOMY/DECOMPRESSION MICRODISCECTOMY 2 LEVELS L3-5;  Surgeon: Meade Maw, MD;  Location: [facility] ORS;  Service: Neurosurgery;  Laterality: N/A;   UPPER GI ENDOSCOPY  10/2018   light run    Home Medications: Prior to Admission medications   Medication Sig Start Date End Date Taking? Authorizing Provider  acetaminophen (TYLENOL) 500 MG tablet Take 2 tablets (1,000 mg total) by mouth every 6 (six) hours as  needed for mild pain or fever. 11/26/21  Yes Wieting, Richard, MD  beta carotene w/minerals (OCUVITE) tablet Take 1 tablet by mouth daily.   Yes [provider]  bisacodyl (DULCOLAX) 5 MG EC tablet Take 1 tablet (5 mg total) by mouth once for 1 dose. 02/03/22 02/03/22 Yes Marche Hottenstein, Jacqulyn Bath, MD  Blood Glucose Monitoring Suppl (GMATE SMART METER KIT) DEVI by Does not apply route.   Yes [provider]  busPIRone (BUSPAR) 10 MG tablet Take 10 mg by mouth 2 (two) times daily.   Yes [provider]   Cholecalciferol 25 MCG (1000 UT) capsule Take 1,000 Units by mouth every evening.    Yes [provider]  feeding supplement, GLUCERNA SHAKE, (GLUCERNA SHAKE) LIQD Take 237 mLs by mouth 3 (three) times daily between meals. 11/26/21  Yes Wieting, Richard, MD  fenofibrate 160 MG tablet Take 160 mg by mouth at bedtime.    Yes [provider]  glucose blood test strip 1 each by Other route as needed for other. Use as instructed   Yes [provider]  levothyroxine (SYNTHROID) 125 MCG tablet Take 125 mcg by mouth daily before breakfast. 11/27/18  Yes [provider]  liothyronine (CYTOMEL) 5 MCG tablet Take 5 mcg by mouth daily.   Yes [provider]  methotrexate (RHEUMATREX) 2.5 MG tablet Take 20 mg by mouth once a week. 12/20/21  Yes [provider]  metoprolol succinate (TOPROL-XL) 100 MG 24 hr tablet Take 100 mg by mouth daily. Take with or immediately following a meal.   Yes [provider]  metroNIDAZOLE (FLAGYL) 500 MG tablet Take 2 tablets at 8 AM, take 2 tablets at 2 PM, and take 2 tablets at 8 PM the day prior to your surgery. 02/03/22  Yes Brenyn Petrey, Jacqulyn Bath, MD  Misc Natural Products (LUTEIN 20 PO) Take 20 mg by mouth daily.   Yes [provider]  neomycin (MYCIFRADIN) 500 MG tablet Take 2 tablet at 8am, take 2 tablets at 2pm, and take 2 tablets at 8pm the day prior to your surgery 02/03/22  Yes Jovahn Breit, MD  pantoprazole (PROTONIX) 40 MG tablet Take 40 mg by mouth daily.   Yes [provider]  pioglitazone (ACTOS) 45 MG tablet Take 45 mg by mouth daily.   Yes [provider]  polyethylene glycol powder (GLYCOLAX/MIRALAX) 17 GM/SCOOP powder Take 255 g by mouth once for 1 dose. 02/03/22 02/03/22 Yes Denita Lun, Jacqulyn Bath, MD  QUEtiapine (SEROQUEL) 50 MG tablet Take 50 mg by mouth at bedtime.   Yes [provider]  rosuvastatin (CRESTOR) 10 MG tablet Take 10 mg by mouth at bedtime.   Yes [provider]   venlafaxine XR (EFFEXOR-XR) 150 MG 24 hr capsule Take 150 mg by mouth daily with breakfast. 11/02/21  Yes [provider]  vitamin B-12 (CYANOCOBALAMIN) 1000 MCG tablet Take 1,000 mcg by mouth daily.   Yes [provider]    Allergies: Allergies  Allergen Reactions   Sulfa Antibiotics Swelling    Swelling of face   Eggs Or Egg-Derived Products Nausea And Vomiting   Methocarbamol Other (See Comments)    Lips felt swollen, but were not   Parafon Forte Dsc [Chlorzoxazone] Other (See Comments)    Unknown reaction. Patient does not remember taking this medication   Sulfur Dioxide Other (See Comments)   Tizanidine Other (See Comments)   Codeine Other (See Comments)    Jittery    Review of Systems: Review of Systems  Constitutional:  Negative for chills and fever.  HENT:  Negative for hearing loss.   Respiratory:  Negative for shortness of breath.   Cardiovascular:  Negative for chest pain.  Gastrointestinal:  Negative for abdominal pain, nausea and vomiting.  Genitourinary:  Negative for dysuria.  Musculoskeletal:  Negative for myalgias.  Skin:  Negative for rash.  Neurological:  Negative for dizziness.  Psychiatric/Behavioral:  Negative for depression.     Physical Exam BP (!) 169/65   Pulse 68   Temp 98.7 F (37.1 C) (Oral)   Ht _0  (1.575 m)   Wt 177 lb (80.3 kg)   SpO2 100%   BMI 32.37 kg/m  CONSTITUTIONAL: Acute distress, well-nourished HEENT:  Normocephalic, atraumatic, extraocular motion intact. NECK: Trachea is midline, no jugular venous distention. RESPIRATORY:  Lungs are clear, and breath sounds are equal bilaterally. Normal respiratory effort without pathologic use of accessory muscles. CARDIOVASCULAR: Heart is regular without murmurs, gallops, or rubs. GI: The abdomen is soft, nondistended, nontender to palpation.  Midline incision is well-healed without any evidence of hernia.  Left lower quadrant ostomy is patent and pink with stool in the  bag.  MUSCULOSKELETAL: Normal gait, no peripheral edema. NEUROLOGIC:  Motor and sensation is grossly normal.  Cranial nerves are grossly intact. PSYCH:  Alert and oriented to person, place and time. Affect is normal.  Labs/Imaging: Colonoscopy on 01/13/2022: Impression:             - Preparation of the colon was fair. - One 6 mm polyp in the ascending colon, removed with a cold snare. Resected and retrieved. - The examination was otherwise normal. - The Hartmann pouch is normal.  Assessment and Plan: This is a 77 y.o. female status post Hartman's procedure for sigmoid colon stricture.  - Discussed with the patient the findings on the colonoscopy and that there is currently no issues that would prevent this from continuing with our plan for surgical reversal of the colostomy.  Discussed with the patient the current plan for a robotic assisted colostomy reversal and reviewed with her the surgery at length including the incisions, the risks of bleeding, infection, injury to surrounding structures, the possibility of a diverting loop ileostomy or the possibility of open surgery, the use of ICG by urology to inject into the ureters for better evaluation intraoperatively, that this would be an inpatient admission, hospital course, postoperative activity restrictions, pain control, and she is willing to proceed. - We will send for medical clearance to her PCP. - We will schedule the patient for robotic assisted colostomy reversal on 03/04/2022.  All of her questions have been answered.  I spent 40 minutes dedicated to the care of this patient on the date of this encounter to include pre-visit review of records, face-to-face time with the patient discussing diagnosis and management, and any post-visit coordination of care.   Melvyn Neth, [facility]

## 2022-02-03 NOTE — Telephone Encounter (Signed)
Medical clearance faxed to DR.Fulton Reek.

## 2022-02-04 ENCOUNTER — Other Ambulatory Visit: Payer: Self-pay

## 2022-02-04 ENCOUNTER — Telehealth: Payer: Self-pay | Admitting: Surgery

## 2022-02-04 NOTE — Telephone Encounter (Signed)
Patient has been advised of Pre-Admission date/time, and Surgery date at [facility].  Surgery Date: 03/04/22 Preadmission Testing Date: 02/24/22 @ 9:00 am, this is an in person visit at preadmissions for ostomy markings.    Patient has been made aware to call [phone], between 1-3:00pm the day before surgery, to find out what time to arrive for surgery.

## 2022-02-05 NOTE — Progress Notes (Unsigned)
Medical Clearance has been received from Dr Doy Hutching. The patient is cleared at Medium risk for surgery.

## 2022-02-08 ENCOUNTER — Encounter: Payer: Medicare HMO | Admitting: Surgery

## 2022-02-23 ENCOUNTER — Other Ambulatory Visit: Payer: Medicare HMO

## 2022-02-24 ENCOUNTER — Other Ambulatory Visit: Payer: Self-pay

## 2022-02-24 ENCOUNTER — Encounter
Admission: RE | Admit: 2022-02-24 | Discharge: 2022-02-24 | Disposition: A | Discharge status: Home / Self Care | Payer: Medicare HMO | Source: Ambulatory Visit | Attending: Surgery | Admitting: Surgery

## 2022-02-24 VITALS — BP 142/53 | HR 78 | Resp 16 | Wt 184.3 lb

## 2022-02-24 DIAGNOSIS — Z01812 Encounter for preprocedural laboratory examination: Secondary | ICD-10-CM | POA: Diagnosis not present

## 2022-02-24 DIAGNOSIS — Z79899 Other long term (current) drug therapy: Secondary | ICD-10-CM | POA: Diagnosis not present

## 2022-02-24 DIAGNOSIS — Z933 Colostomy status: Secondary | ICD-10-CM | POA: Diagnosis not present

## 2022-02-24 HISTORY — DX: Fibromyalgia: M79.7

## 2022-02-24 LAB — CBC WITH DIFFERENTIAL/PLATELET
Abs Immature Granulocytes: 0.01 10*3/uL (ref 0.00–0.07)
Basophils Absolute: 0 10*3/uL (ref 0.0–0.1)
Basophils Relative: 1 %
Eosinophils Absolute: 0.1 10*3/uL (ref 0.0–0.5)
Eosinophils Relative: 1 %
HCT: 32.5 % — ABNORMAL LOW (ref 36.0–46.0)
Hemoglobin: 10.2 g/dL — ABNORMAL LOW (ref 12.0–15.0)
Immature Granulocytes: 0 %
Lymphocytes Relative: 23 %
Lymphs Abs: 1.1 10*3/uL (ref 0.7–4.0)
MCH: 27.4 pg (ref 26.0–34.0)
MCHC: 31.4 g/dL (ref 30.0–36.0)
MCV: 87.4 fL (ref 80.0–100.0)
Monocytes Absolute: 0.2 10*3/uL (ref 0.1–1.0)
Monocytes Relative: 5 %
Neutro Abs: 3.4 10*3/uL (ref 1.7–7.7)
Neutrophils Relative %: 70 %
Platelets: 131 10*3/uL — ABNORMAL LOW (ref 150–400)
RBC: 3.72 MIL/uL — ABNORMAL LOW (ref 3.87–5.11)
RDW: 16.1 % — ABNORMAL HIGH (ref 11.5–15.5)
WBC: 4.9 10*3/uL (ref 4.0–10.5)
nRBC: 0 % (ref 0.0–0.2)

## 2022-02-24 LAB — COMPREHENSIVE METABOLIC PANEL
ALT: 16 U/L (ref 0–44)
AST: 21 U/L (ref 15–41)
Albumin: 3.3 g/dL — ABNORMAL LOW (ref 3.5–5.0)
Alkaline Phosphatase: 82 U/L (ref 38–126)
Anion gap: 8 (ref 5–15)
BUN: 19 mg/dL (ref 8–23)
CO2: 25 mmol/L (ref 22–32)
Calcium: 9 mg/dL (ref 8.9–10.3)
Chloride: 104 mmol/L (ref 98–111)
Creatinine, Ser: 0.79 mg/dL (ref 0.44–1.00)
GFR, Estimated: >60 mL/min (ref >60)
Glucose, Bld: 113 mg/dL — ABNORMAL HIGH (ref 70–99)
Potassium: 3.6 mmol/L (ref 3.5–5.1)
Sodium: 137 mmol/L (ref 135–145)
Total Bilirubin: 0.6 mg/dL (ref 0.3–1.2)
Total Protein: 7 g/dL (ref 6.5–8.1)

## 2022-02-24 LAB — TYPE AND SCREEN
ABO/RH(D): A POS
Antibody Screen: NEGATIVE

## 2022-02-24 LAB — PROTIME-INR
INR: 0.9 (ref 0.8–1.2)
Prothrombin Time: 12.5 seconds (ref 11.4–15.2)

## 2022-02-24 NOTE — Consult Note (Signed)
Entered in error

## 2022-02-24 NOTE — Patient Instructions (Addendum)
Your procedure is scheduled on: 03/04/22 - Thursday Report to the Registration Desk on the 1st floor of the [place]. To find out your arrival time, please call [phone] between 1PM - 3PM on: 03/03/22 - Wednesday If your arrival time is 6:00 am, do not arrive prior to that time as the [place] entrance doors do not open until 6:00 am.  REMEMBER: Instructions that are not followed completely may result in serious medical risk, up to and including death; or upon the discretion of your surgeon and anesthesiologist your surgery may need to be rescheduled.  - Please follow the Bowel prep sheet instructions given to you by Dr. Mont Dutton office  - Pick your medication up at the pharmacy prescribed to you by Dr. Hampton Abbot, follow instructions.  - You will need to pick up a 64 ounce bottle of Gatorade.  - Fleets Enema x 2 as instructed by Dr. Mont Dutton office.  TAKE THESE MEDICATIONS THE MORNING OF SURGERY WITH A SIP OF WATER:  - pantoprazole (PROTONIX) - (take one the night before and one on the morning of surgery - helps to prevent nausea after surgery.) - busPIRone (BUSPAR)  - liothyronine (CYTOMEL)  - metoprolol succinate (TOPROL-XL)  - venlafaxine XR (EFFEXOR-XR)  - levothyroxine (SYNTHROID)    One week prior to surgery: Stop Anti-inflammatories (NSAIDS) such as Advil, Aleve, Ibuprofen, Motrin, Naproxen, Naprosyn and Aspirin based products such as Excedrin, Goodys Powder, BC Powder.  Stop ANY OVER THE COUNTER supplements until after surgery.  You may however, continue to take Tylenol if needed for pain up until the day of surgery.  No Alcohol for 24 hours before or after surgery.  No Smoking including e-cigarettes for 24 hours prior to surgery.  No chewable tobacco products for at least 6 hours prior to surgery.  No nicotine patches on the day of surgery.  Do not use any "recreational" drugs for at least a week prior to your surgery.  Please be advised that the  combination of cocaine and anesthesia may have negative outcomes, up to and including death. If you test positive for cocaine, your surgery will be cancelled.  On the morning of surgery brush your teeth with toothpaste and water, you may rinse your mouth with mouthwash if you wish. Do not swallow any toothpaste or mouthwash.  Use CHG Soap or wipes as directed on instruction sheet.  Do not wear jewelry, make-up, hairpins, clips or nail polish.  Do not wear lotions, powders, or perfumes.   Do not shave body from the neck down 48 hours prior to surgery just in case you cut yourself which could leave a site for infection.  Also, freshly shaved skin may become irritated if using the CHG soap.  Contact lenses, hearing aids and dentures may not be worn into surgery.  Do not bring valuables to the hospital. [facility] is not responsible for any missing/lost belongings or valuables.   Notify your doctor if there is any change in your medical condition (cold, fever, infection).  Wear comfortable clothing (specific to your surgery type) to the hospital.  After surgery, you can help prevent lung complications by doing breathing exercises.  Take deep breaths and cough every 1-2 hours. Your doctor may order a device called an Incentive Spirometer to help you take deep breaths. When coughing or sneezing, hold a pillow firmly against your incision with both hands. This is called "splinting." Doing this helps protect your incision. It also decreases belly discomfort.  If you are being  admitted to the hospital overnight, leave your suitcase in the car. After surgery it may be brought to your room.  If you are being discharged the day of surgery, you will not be allowed to drive home. You will need a responsible adult (18 years or older) to drive you home and stay with you that night.   If you are taking public transportation, you will need to have a responsible adult (18 years or older) with  you. Please confirm with your physician that it is acceptable to use public transportation.   Please call the [place] Dept. at [phone] if you have any questions about these instructions.  Surgery Visitation Policy:  Patients undergoing a surgery or procedure may have two family members or support persons with them as long as the person is not COVID-19 positive or experiencing its symptoms.   Inpatient Visitation:    Visiting hours are 7 a.m. to 8 p.m. Up to four visitors are allowed at one time in a patient room. The visitors may rotate out with other people during the day. One designated support person (adult) may remain overnight.  Due to an increase in RSV and influenza rates and associated hospitalizations, children ages 66 and under will not be able to visit patients in [facility]. Masks continue to be strongly recommended.

## 2022-02-26 DIAGNOSIS — E782 Mixed hyperlipidemia: Secondary | ICD-10-CM | POA: Diagnosis not present

## 2022-02-26 DIAGNOSIS — E1122 Type 2 diabetes mellitus with diabetic chronic kidney disease: Secondary | ICD-10-CM | POA: Diagnosis not present

## 2022-02-26 DIAGNOSIS — N183 Chronic kidney disease, stage 3 unspecified: Secondary | ICD-10-CM | POA: Diagnosis not present

## 2022-02-26 DIAGNOSIS — E538 Deficiency of other specified B group vitamins: Secondary | ICD-10-CM | POA: Diagnosis not present

## 2022-02-26 DIAGNOSIS — Z79899 Other long term (current) drug therapy: Secondary | ICD-10-CM | POA: Diagnosis not present

## 2022-02-26 DIAGNOSIS — E890 Postprocedural hypothyroidism: Secondary | ICD-10-CM | POA: Diagnosis not present

## 2022-02-26 DIAGNOSIS — I129 Hypertensive chronic kidney disease with stage 1 through stage 4 chronic kidney disease, or unspecified chronic kidney disease: Secondary | ICD-10-CM | POA: Diagnosis not present

## 2022-03-04 ENCOUNTER — Encounter: Payer: Self-pay | Admitting: Certified Registered"

## 2022-03-04 ENCOUNTER — Ambulatory Visit
Admission: RE | Admit: 2022-03-04 | Discharge: 2022-03-04 | Disposition: A | Discharge status: Still In Hospital | Payer: Medicare HMO | Source: Ambulatory Visit | Attending: Surgery | Admitting: Surgery

## 2022-03-04 ENCOUNTER — Other Ambulatory Visit: Payer: Self-pay

## 2022-03-04 ENCOUNTER — Emergency Department
Admission: EM | Admit: 2022-03-04 | Discharge: 2022-03-04 | Disposition: A | Discharge status: Home / Self Care | Payer: Medicare HMO | Attending: Emergency Medicine | Admitting: Emergency Medicine

## 2022-03-04 ENCOUNTER — Encounter: Payer: Self-pay | Admitting: Surgery

## 2022-03-04 ENCOUNTER — Inpatient Hospital Stay: Payer: Medicare HMO

## 2022-03-04 ENCOUNTER — Encounter
Admission: RE | Disposition: A | Discharge status: Still In Hospital | Payer: Self-pay | Source: Ambulatory Visit | Attending: Surgery

## 2022-03-04 DIAGNOSIS — W1839XA Other fall on same level, initial encounter: Secondary | ICD-10-CM | POA: Insufficient documentation

## 2022-03-04 DIAGNOSIS — N189 Chronic kidney disease, unspecified: Secondary | ICD-10-CM | POA: Insufficient documentation

## 2022-03-04 DIAGNOSIS — Z933 Colostomy status: Secondary | ICD-10-CM | POA: Insufficient documentation

## 2022-03-04 DIAGNOSIS — E039 Hypothyroidism, unspecified: Secondary | ICD-10-CM | POA: Diagnosis not present

## 2022-03-04 DIAGNOSIS — S0990XA Unspecified injury of head, initial encounter: Secondary | ICD-10-CM | POA: Insufficient documentation

## 2022-03-04 DIAGNOSIS — Z01812 Encounter for preprocedural laboratory examination: Principal | ICD-10-CM

## 2022-03-04 DIAGNOSIS — R55 Syncope and collapse: Secondary | ICD-10-CM | POA: Insufficient documentation

## 2022-03-04 DIAGNOSIS — E1122 Type 2 diabetes mellitus with diabetic chronic kidney disease: Secondary | ICD-10-CM | POA: Diagnosis not present

## 2022-03-04 DIAGNOSIS — Z539 Procedure and treatment not carried out, unspecified reason: Secondary | ICD-10-CM | POA: Insufficient documentation

## 2022-03-04 LAB — COMPREHENSIVE METABOLIC PANEL
ALT: 18 U/L (ref 0–44)
AST: 21 U/L (ref 15–41)
Albumin: 3.8 g/dL (ref 3.5–5.0)
Alkaline Phosphatase: 94 U/L (ref 38–126)
Anion gap: 12 (ref 5–15)
BUN: 15 mg/dL (ref 8–23)
CO2: 18 mmol/L — ABNORMAL LOW (ref 22–32)
Calcium: 9.1 mg/dL (ref 8.9–10.3)
Chloride: 103 mmol/L (ref 98–111)
Creatinine, Ser: 0.77 mg/dL (ref 0.44–1.00)
GFR, Estimated: >60 mL/min (ref >60)
Glucose, Bld: 118 mg/dL — ABNORMAL HIGH (ref 70–99)
Potassium: 3.2 mmol/L — ABNORMAL LOW (ref 3.5–5.1)
Sodium: 133 mmol/L — ABNORMAL LOW (ref 135–145)
Total Bilirubin: 1.2 mg/dL (ref 0.3–1.2)
Total Protein: 7.6 g/dL (ref 6.5–8.1)

## 2022-03-04 LAB — CBC WITH DIFFERENTIAL/PLATELET
Abs Immature Granulocytes: 0.03 10*3/uL (ref 0.00–0.07)
Basophils Absolute: 0 10*3/uL (ref 0.0–0.1)
Basophils Relative: 0 %
Eosinophils Absolute: 0 10*3/uL (ref 0.0–0.5)
Eosinophils Relative: 0 %
HCT: 34.8 % — ABNORMAL LOW (ref 36.0–46.0)
Hemoglobin: 11.4 g/dL — ABNORMAL LOW (ref 12.0–15.0)
Immature Granulocytes: 0 %
Lymphocytes Relative: 8 %
Lymphs Abs: 0.7 10*3/uL (ref 0.7–4.0)
MCH: 28 pg (ref 26.0–34.0)
MCHC: 32.8 g/dL (ref 30.0–36.0)
MCV: 85.5 fL (ref 80.0–100.0)
Monocytes Absolute: 0.4 10*3/uL (ref 0.1–1.0)
Monocytes Relative: 4 %
Neutro Abs: 7.6 10*3/uL (ref 1.7–7.7)
Neutrophils Relative %: 88 %
Platelets: 257 10*3/uL (ref 150–400)
RBC: 4.07 MIL/uL (ref 3.87–5.11)
RDW: 16.3 % — ABNORMAL HIGH (ref 11.5–15.5)
WBC: 8.7 10*3/uL (ref 4.0–10.5)
nRBC: 0 % (ref 0.0–0.2)

## 2022-03-04 LAB — GLUCOSE, CAPILLARY: Glucose-Capillary: 113 mg/dL — ABNORMAL HIGH (ref 70–99)

## 2022-03-04 IMAGING — MR MR LUMBAR SPINE W/O CM
4 of 5 series · 33 of 48 positions shown · non-contrast
Comparison: Radiography 10/25/2018.  MRI 08/23/2018.

CLINICAL DATA: Surgery in Monday December, 2018. Fell 1 month ago. Mid
and low back pain with bilateral leg weakness left worse than right.

EXAM:
MRI LUMBAR SPINE WITHOUT CONTRAST
TECHNIQUE: Multiplanar, multisequence MR imaging of the lumbar spine was
performed. No intravenous contrast was administered.

[Series 1: T2 · sagittal · 4.0mm · 0.81mm/px · 8 of 19 slices shown (1 of 2)]
[im 1/19]
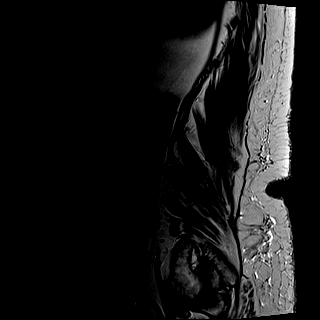
[im 3/19]
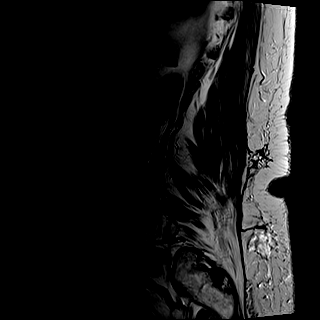
[im 6/19]
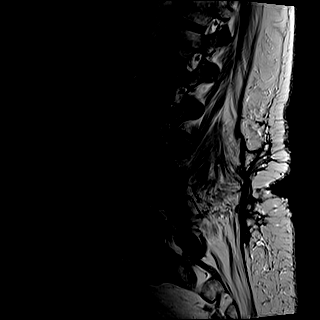
[im 8/19]
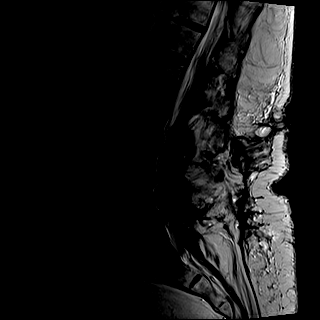
[im 11/19]
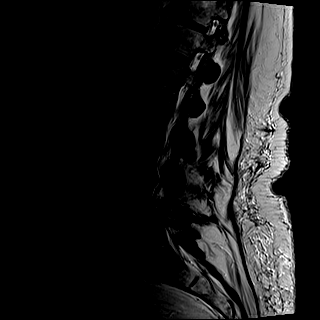
[im 13/19]
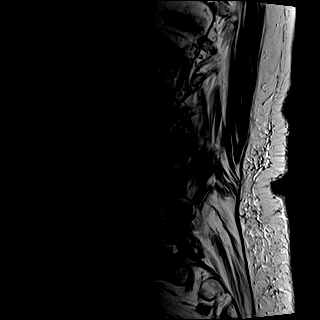
[im 16/19]
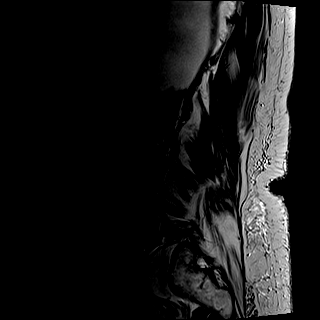
[im 19/19]
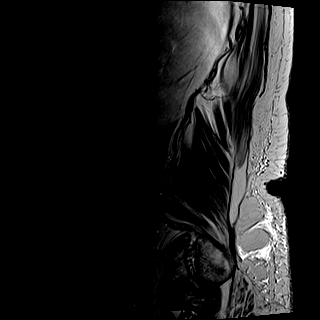

[Series 2: T1 · sagittal · 4.0mm · 0.81mm/px · 7 of 19 slices shown (1 of 2)]
[im 1/19]
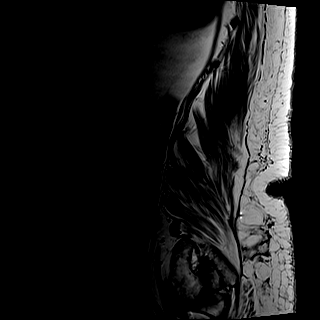
[im 4/19]
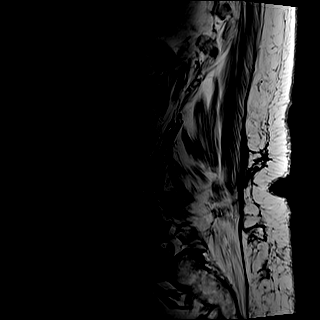
[im 7/19]
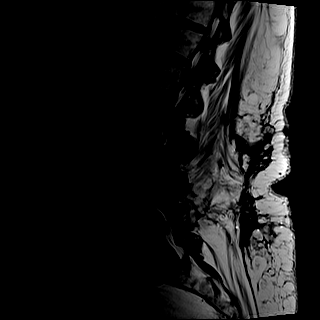
[im 10/19]
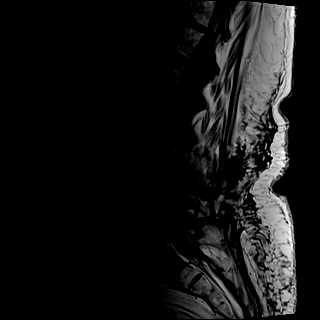
[im 13/19]
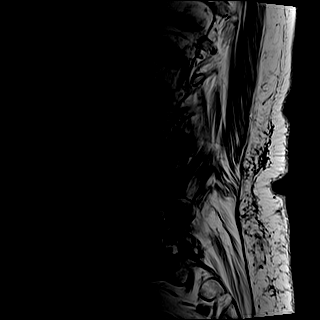
[im 16/19]
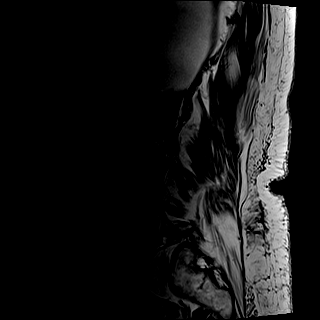
[im 19/19]
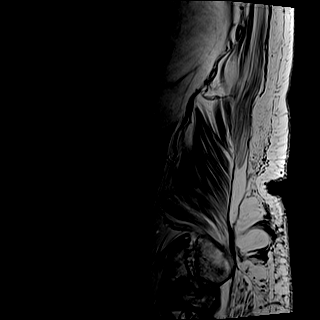

[Series 4: T2 · axial · 4.0mm · 0.78mm/px · z∈[-557,-344]mm · 9 of 34 slices shown (2 of 2)]
[im 1/34]
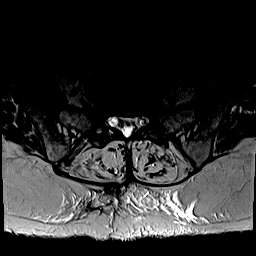
[im 6/34]
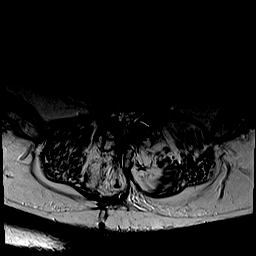
[im 12/34]
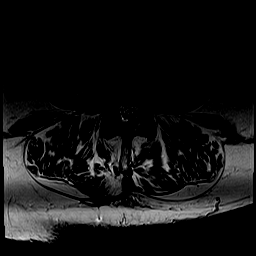
[im 14/34]
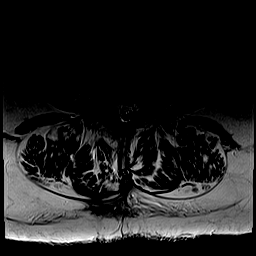
[im 17/34]
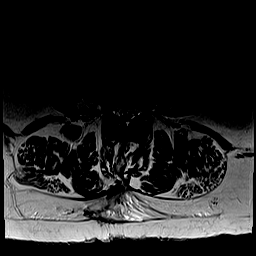
[im 20/34]
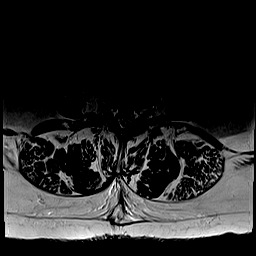
[im 23/34]
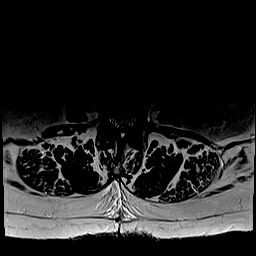
[im 28/34]
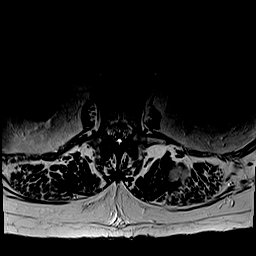
[im 34/34]
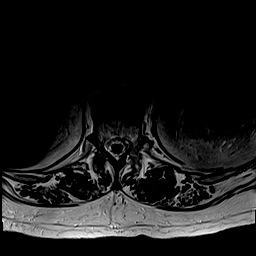

[Series 5: T1 · axial · 4.0mm · 0.39mm/px · z∈[-557,-344]mm · 9 of 34 slices shown (2 of 2)]
[im 1/34]
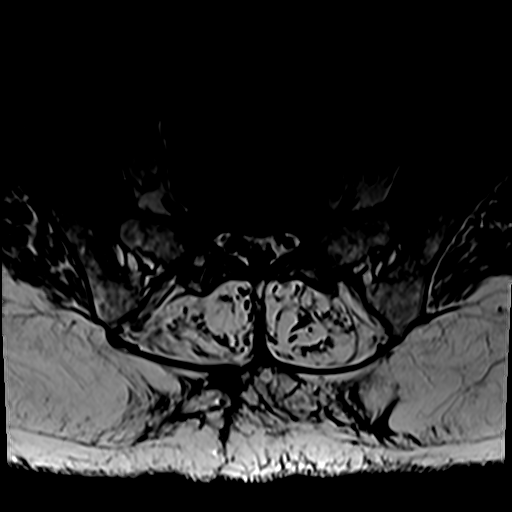
[im 6/34]
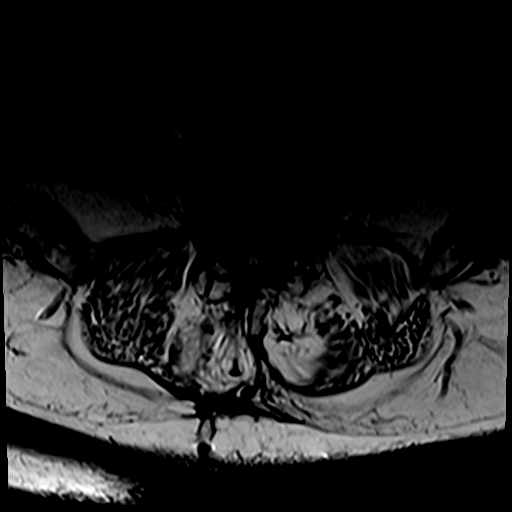
[im 12/34]
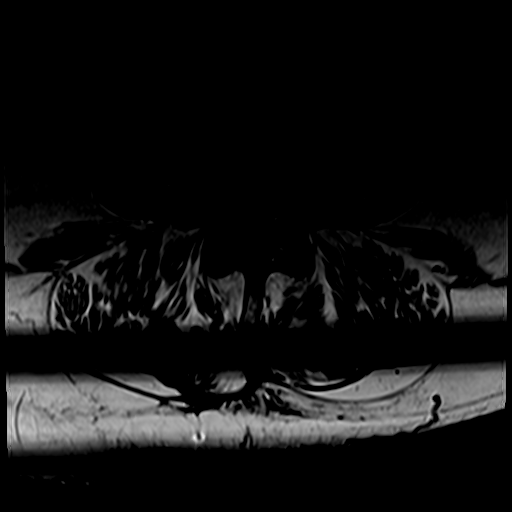
[im 14/34]
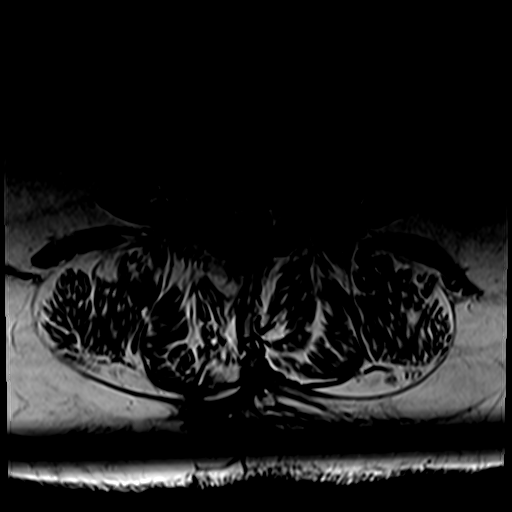
[im 17/34]
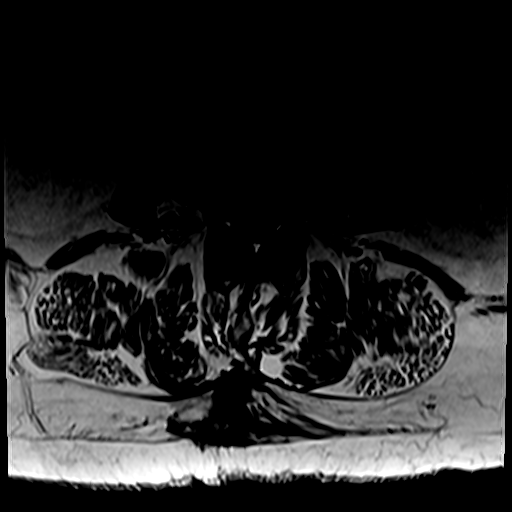
[im 20/34]
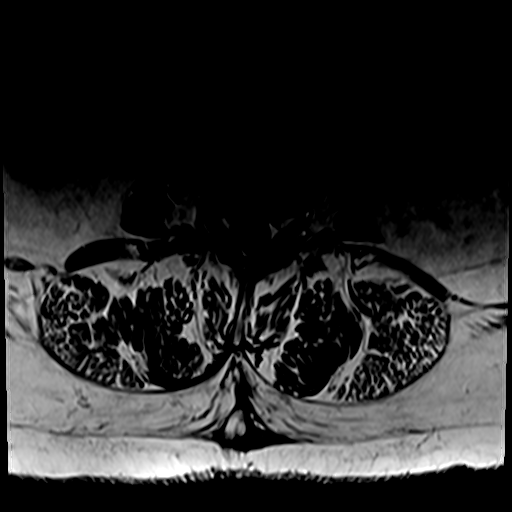
[im 23/34]
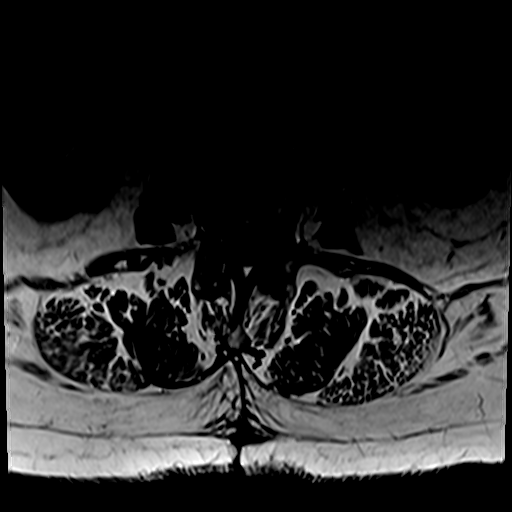
[im 28/34]
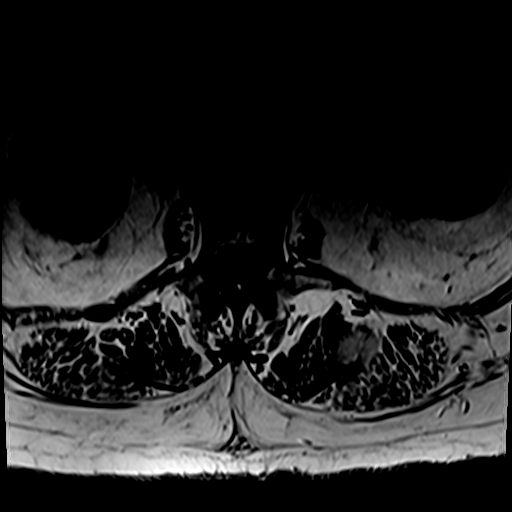
[im 34/34]
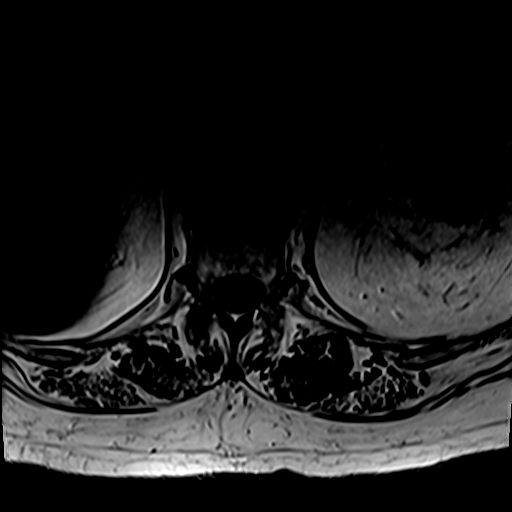

[33 of 48 positions shown; findings below may reference images not displayed]

FINDINGS: Segmentation:  5 lumbar type vertebral bodies.

Alignment: Anterolisthesis at L4-5 of 6.5 mm, unchanged since [REDACTED]
of last year.

Vertebrae:  No fracture or primary bone lesion.

Conus medullaris and cauda equina: Conus extends to the L1-2 level.
Conus and cauda equina appear normal.

Paraspinal and other soft tissues: Negative

Disc levels:

No significant finding at T11-12, T12-L1 or L1-2.

L2-3: Bulging of the disc. Facet and ligamentous hypertrophy. Mild
multifactorial stenosis without visible neural compression.

L3-4: Previous posterior decompression. Bulging of the disc. No
compressive canal stenosis. Mild stenosis of the subarticular
lateral recesses and neural foramina. There is a somewhat unusual
far lateral disc herniation with upward migration of disc material
adjacent to the left lateral margin of the L3 vertebral body which
was not present in Thursday August, 2018. There is potential this could
affect the left L3 nerve and could certainly relate to back pain.

L4-5: Previous posterior decompression. 6.5 mm anterolisthesis as
seen previously. Mild bulging of the disc. Mild canal stenosis with
narrowing of the subarticular lateral recesses and neural foramina
but no distinct neural compression. No postoperative fluid
collection.

L5-S1: Normal appearance of the disc. Mild facet osteoarthritis. No
canal or foraminal stenosis. No change.
IMPRESSION: No evidence of regional fracture.

Interval posterior decompression at L3-4 and L4-5. Much less
stenosis at the L4-5 level. Mild narrowing of the lateral recesses
and foramina. At L3-4, there is a newly seen far left lateral disc
herniation with upward migration along the left lateral margin of
the L3 vertebral body. This is somewhat unusual and could certainly
relate to back pain. Some potential this could affect the left L3
nerve.

## 2022-03-04 SURGERY — CLOSURE, COLOSTOMY, ROBOT-ASSISTED
Anesthesia: General

## 2022-03-04 MED ORDER — PROPOFOL 1000 MG/100ML IV EMUL
INTRAVENOUS | Status: AC
  Filled 2022-03-04: qty 200

## 2022-03-04 MED ORDER — ACETAMINOPHEN 10 MG/ML IV SOLN
INTRAVENOUS | Status: AC
  Filled 2022-03-04: qty 100

## 2022-03-04 MED ORDER — PHENYLEPHRINE HCL-NACL 20-0.9 MG/250ML-% IV SOLN
INTRAVENOUS | Status: AC
  Filled 2022-03-04: qty 250

## 2022-03-04 MED ORDER — ACETAMINOPHEN 500 MG PO TABS
ORAL_TABLET | ORAL | Status: AC
  Filled 2022-03-04: qty 2

## 2022-03-04 MED ORDER — SODIUM CHLORIDE 0.9 % IV BOLUS
1000.0000 mL | Freq: Once | INTRAVENOUS | Status: AC
  Administered 2022-03-04: 1000 mL via INTRAVENOUS

## 2022-03-04 MED ORDER — ACETAMINOPHEN 500 MG PO TABS: 1000.0000 mg | ORAL_TABLET | ORAL | Status: DC

## 2022-03-04 MED ORDER — CHLORHEXIDINE GLUCONATE 0.12 % MT SOLN: 15.0000 mL | Freq: Once | OROMUCOSAL | Status: DC

## 2022-03-04 MED ORDER — CHLORHEXIDINE GLUCONATE 0.12 % MT SOLN
OROMUCOSAL | Status: AC
  Filled 2022-03-04: qty 15

## 2022-03-04 MED ORDER — FLEET ENEMA 7-19 GM/118ML RE ENEM: 1.0000 | ENEMA | Freq: Once | RECTAL | Status: DC

## 2022-03-04 MED ORDER — SODIUM CHLORIDE 0.9 % IV SOLN: 2.0000 g | INTRAVENOUS | Status: DC

## 2022-03-04 MED ORDER — ALVIMOPAN 12 MG PO CAPS: 12.0000 mg | ORAL_CAPSULE | ORAL | Status: DC

## 2022-03-04 MED ORDER — ORAL CARE MOUTH RINSE: 15.0000 mL | Freq: Once | OROMUCOSAL | Status: DC

## 2022-03-04 MED ORDER — BUPIVACAINE LIPOSOME 1.3 % IJ SUSP
INTRAMUSCULAR | Status: AC
  Filled 2022-03-04: qty 20

## 2022-03-04 MED ORDER — PROPOFOL 10 MG/ML IV BOLUS
INTRAVENOUS | Status: AC
  Filled 2022-03-04: qty 40

## 2022-03-04 MED ORDER — FENTANYL CITRATE (PF) 100 MCG/2ML IJ SOLN
INTRAMUSCULAR | Status: AC
  Filled 2022-03-04: qty 2

## 2022-03-04 MED ORDER — ALVIMOPAN 12 MG PO CAPS
ORAL_CAPSULE | ORAL | Status: AC
  Filled 2022-03-04: qty 1

## 2022-03-04 MED ORDER — POTASSIUM CHLORIDE CRYS ER 20 MEQ PO TBCR
40.0000 meq | EXTENDED_RELEASE_TABLET | Freq: Once | ORAL | Status: AC
  Administered 2022-03-04: 40 meq via ORAL
  Filled 2022-03-04: qty 2

## 2022-03-04 MED ORDER — BUPIVACAINE LIPOSOME 1.3 % IJ SUSP: 20.0000 mL | Freq: Once | INTRAMUSCULAR | Status: DC

## 2022-03-04 MED ORDER — GABAPENTIN 100 MG PO CAPS
ORAL_CAPSULE | ORAL | Status: AC
  Filled 2022-03-04: qty 2

## 2022-03-04 MED ORDER — CHLORHEXIDINE GLUCONATE CLOTH 2 % EX PADS: 6.0000 | MEDICATED_PAD | Freq: Once | CUTANEOUS | Status: DC

## 2022-03-04 MED ORDER — SODIUM CHLORIDE 0.9 % IV SOLN: INTRAVENOUS | Status: DC

## 2022-03-04 MED ORDER — BUPIVACAINE-EPINEPHRINE (PF) 0.5% -1:200000 IJ SOLN
INTRAMUSCULAR | Status: AC
  Filled 2022-03-04: qty 30

## 2022-03-04 MED ORDER — GABAPENTIN 100 MG PO CAPS: 200.0000 mg | ORAL_CAPSULE | ORAL | Status: DC

## 2022-03-04 MED ORDER — SODIUM CHLORIDE 0.9 % IV SOLN
INTRAVENOUS | Status: AC
  Filled 2022-03-04: qty 2

## 2022-03-04 SURGICAL SUPPLY — 88 items
BAG DRAIN SIEMENS DORNER NS (MISCELLANEOUS) ×2 IMPLANT
BAG URINE DRAIN 2000ML AR STRL (UROLOGICAL SUPPLIES) ×2 IMPLANT
CANNULA REDUC XI 12-8 STAPL (CANNULA) ×2
CANNULA REDUCER 12-8 DVNC XI (CANNULA) ×2 IMPLANT
CATH FOLEY 2WAY  5CC 16FR (CATHETERS) ×2
CATH URETL OPEN 5X70 (CATHETERS) ×2 IMPLANT
CATH URTH 16FR FL 2W BLN LF (CATHETERS) ×2 IMPLANT
CHLORAPREP W/TINT 26 (MISCELLANEOUS) ×2 IMPLANT
CLIP LIGATING HEM O LOK PURPLE (MISCELLANEOUS) IMPLANT
COVER MAYO STAND REUSABLE (DRAPES) IMPLANT
DERMABOND ADVANCED .7 DNX12 (GAUZE/BANDAGES/DRESSINGS) ×2 IMPLANT
DRAPE ARM DVNC X/XI (DISPOSABLE) ×8 IMPLANT
DRAPE COLUMN DVNC XI (DISPOSABLE) ×2 IMPLANT
DRAPE DA VINCI XI ARM (DISPOSABLE) ×8
DRAPE DA VINCI XI COLUMN (DISPOSABLE) ×2
DRAPE LEGGINS SURG 28X43 STRL (DRAPES) ×2 IMPLANT
DRAPE UNDER BUTTOCK W/FLU (DRAPES) ×2 IMPLANT
ELECT CAUTERY BLADE TIP 2.5 (TIP) ×2
ELECT EZSTD 165MM 6.5IN (MISCELLANEOUS) ×2
ELECT REM PT RETURN 9FT ADLT (ELECTROSURGICAL) ×2
ELECTRODE CAUTERY BLDE TIP 2.5 (TIP) ×2 IMPLANT
ELECTRODE EZSTD 165MM 6.5IN (MISCELLANEOUS) ×2 IMPLANT
ELECTRODE REM PT RTRN 9FT ADLT (ELECTROSURGICAL) ×2 IMPLANT
GAUZE 4X4 16PLY ~~LOC~~+RFID DBL (SPONGE) ×4 IMPLANT
GLOVE SURG SYN 7.0 (GLOVE) ×6 IMPLANT
GLOVE SURG SYN 7.5  E (GLOVE) ×6
GLOVE SURG SYN 7.5 E (GLOVE) ×6 IMPLANT
GLOVE SURG UNDER POLY LF SZ7.5 (GLOVE) ×2 IMPLANT
GOWN STRL REUS W/ TWL LRG LVL3 (GOWN DISPOSABLE) ×12 IMPLANT
GOWN STRL REUS W/TWL LRG LVL3 (GOWN DISPOSABLE) ×12
GRASPER LAPSCPC 5X45 DSP (INSTRUMENTS) IMPLANT
GUIDEWIRE GREEN .038 145CM (MISCELLANEOUS) IMPLANT
GUIDEWIRE STR DUAL SENSOR (WIRE) IMPLANT
HANDLE YANKAUER SUCT BULB TIP (MISCELLANEOUS) ×2 IMPLANT
IRRIGATOR SUCT 8 DISP DVNC XI (IRRIGATION / IRRIGATOR) IMPLANT
IRRIGATOR SUCTION 8MM XI DISP (IRRIGATION / IRRIGATOR)
IV NS 1000ML (IV SOLUTION)
IV NS 1000ML BAXH (IV SOLUTION) IMPLANT
IV NS IRRIG 3000ML ARTHROMATIC (IV SOLUTION) ×2 IMPLANT
KIT IMAGING PINPOINTPAQ (MISCELLANEOUS) ×2 IMPLANT
KIT PINK PAD W/HEAD ARE REST (MISCELLANEOUS) ×2
KIT PINK PAD W/HEAD ARM REST (MISCELLANEOUS) ×2 IMPLANT
LABEL OR SOLS (LABEL) ×2 IMPLANT
MANIFOLD NEPTUNE II (INSTRUMENTS) ×4 IMPLANT
NEEDLE HYPO 22GX1.5 SAFETY (NEEDLE) ×2 IMPLANT
NEEDLE INSUFFLATION 14GA 120MM (NEEDLE) ×2 IMPLANT
NS IRRIG 500ML POUR BTL (IV SOLUTION) ×2 IMPLANT
OBTURATOR OPTICAL STANDARD 8MM (TROCAR)
OBTURATOR OPTICAL STND 8 DVNC (TROCAR)
OBTURATOR OPTICALSTD 8 DVNC (TROCAR) IMPLANT
PACK COLON CLEAN CLOSURE (MISCELLANEOUS) IMPLANT
PACK CYSTO AR (MISCELLANEOUS) ×2 IMPLANT
PACK LAP CHOLECYSTECTOMY (MISCELLANEOUS) ×2 IMPLANT
PAD PREP 24X41 OB/GYN DISP (PERSONAL CARE ITEMS) ×2 IMPLANT
RETRACTOR WOUND ALXS 18CM SML (MISCELLANEOUS) ×2 IMPLANT
RTRCTR WOUND ALEXIS O 18CM SML (MISCELLANEOUS) ×2
SEAL CANN UNIV 5-8 DVNC XI (MISCELLANEOUS) ×6 IMPLANT
SEAL XI 5MM-8MM UNIVERSAL (MISCELLANEOUS) ×6
SEALER VESSEL DA VINCI XI (MISCELLANEOUS)
SEALER VESSEL EXT DVNC XI (MISCELLANEOUS) IMPLANT
SET CYSTO W/LG BORE CLAMP LF (SET/KITS/TRAYS/PACK) ×2 IMPLANT
SOL PREP PVP 2OZ (MISCELLANEOUS) ×2
SOLUTION ELECTROLUBE (MISCELLANEOUS) ×2 IMPLANT
SOLUTION PREP PVP 2OZ (MISCELLANEOUS) ×2 IMPLANT
SPIKE FLUID TRANSFER (MISCELLANEOUS) ×2 IMPLANT
SPONGE T-LAP 18X18 ~~LOC~~+RFID (SPONGE) ×6 IMPLANT
SPONGE T-LAP 4X18 ~~LOC~~+RFID (SPONGE) IMPLANT
STAPLER CANNULA SEAL DVNC XI (STAPLE) ×2 IMPLANT
STAPLER CANNULA SEAL XI (STAPLE) ×2
STAPLER CIRCULAR MANUAL XL 25 (STAPLE) IMPLANT
STAPLER CIRCULAR MANUAL XL 29 (STAPLE) IMPLANT
STAPLER CIRCULAR MANUAL XL 33 (STAPLE) IMPLANT
SURGILUBE 2OZ TUBE FLIPTOP (MISCELLANEOUS) ×4 IMPLANT
SUT DVC VLOC 3-0 CL 6 P-12 (SUTURE) IMPLANT
SUT MNCRL AB 4-0 PS2 18 (SUTURE) ×2 IMPLANT
SUT PDS AB 1 CT1 36 (SUTURE) ×4 IMPLANT
SUT PROLENE 2 0 SH DA (SUTURE) ×2 IMPLANT
SUT VICRYL 0 UR6 27IN ABS (SUTURE) ×2 IMPLANT
SUT VLOC 90 S/L VL9 GS22 (SUTURE) IMPLANT
SYR 10ML LL (SYRINGE) ×4 IMPLANT
SYR TOOMEY IRRIG 70ML (MISCELLANEOUS)
SYRINGE TOOMEY IRRIG 70ML (MISCELLANEOUS) IMPLANT
TRAP FLUID SMOKE EVACUATOR (MISCELLANEOUS) ×4 IMPLANT
TRAY FOLEY MTR SLVR 16FR STAT (SET/KITS/TRAYS/PACK) ×2 IMPLANT
TRAY FOLEY SLVR 16FR LF STAT (SET/KITS/TRAYS/PACK) ×2 IMPLANT
TUBING EVAC SMOKE HEATED PNEUM (TUBING) ×2 IMPLANT
WATER STERILE IRR 1000ML POUR (IV SOLUTION) ×2 IMPLANT
WATER STERILE IRR 500ML POUR (IV SOLUTION) ×4 IMPLANT

## 2022-03-04 NOTE — H&P (Deleted)
UROLOGY H&P UPDATE  Agree with prior H&P by Dr. Hampton Abbot.  Urology was requested for cystoscopy with ureteral ICG to aid in intraoperative ureteral identification.  Cardiac: RRR Lungs: CTA bilaterally  Procedure: Cystoscopy, ureteral instillation of ICG  Informed consent obtained, we specifically discussed the risks of bleeding, infection, ureteral injury, post-operative pain, need for additional procedures.  Billey Co, MD 03/04/2022

## 2022-03-04 NOTE — H&P (Signed)
03/04/22  Patient reports that at 2 AM she went to the bathroom and on her way back she felt lightheaded and passed out before she could fully get back in bed.  While doing so, she fell on the floor and apparently hit her head.  Discussed with her that potentially could be related to dehydration from the bowel prep, but as a precaution, it would be best to postpone surgery and to send her to the ER for further evaluation/workup.  Will try to coordinate to reschedule surgery for 03/23/22 and try to direct admit her to hospital on 2/19 so she can do the bowel prep here until closer monitoring.  Patient will be discharged from preop and go to ER.  Discussed with Dr. Joni Fears.  Olean Ree, MD

## 2022-03-04 NOTE — Discharge Instructions (Signed)
Your CT scan of the head and lab tests were all okay today.  Your potassium level is slightly low which we supplemented in the emergency department.  Resume your usual diet and follow Dr. Mont Dutton instructions regarding your rescheduled surgery.

## 2022-03-04 NOTE — Consult Note (Signed)
[facility] Nurse requested for preoperative stoma site marking  Discussed surgical procedure and stoma creation with patient and family.  Explained role of the [place] nurse team.  Provided the patient with educational booklet and provided samples of pouching options.  Answered patient and family questions. Patient is anxious and hopeful she will have a takedown of her colostomy and not require another stoma.  Emotional support provided.   Examined patient lying, sitting, and standing in order to place the marking in the patient's visual field, away from any creases or abdominal contour issues and within the rectus muscle. Patient cannot see her LLQ colostomy due to her body habitus  I have marked her in the RUQ as the preferred site.  I have placed a second marking in the RLQ if needed, but patient will likely have to continue to rely on her sister for care as she does now.  I am hopeful she could gain independence with a higher marking.     Marked for ileostomy in the RLQ  6 cm to the right of the umbilicus and  4 cm above and below the umbilicus. Higher marking preferred.   Patient's abdomen cleansed with CHG wipes at site markings, allowed to air dry prior to marking.Covered mark with thin film transparent dressing to preserve mark until date of surgery.   [facility] Nurse team will follow up with patient after surgery for continue ostomy care and teaching.   Estrellita Ludwig MSN, RN, FNP-BC [facility]ON Wound, Ostomy, Continence Nurse [facility] [phone] Pager [phone]

## 2022-03-04 NOTE — ED Provider Notes (Signed)
[facility] Provider Note    None    (approximate)   History   Chief Complaint: Loss of Consciousness   HPI  Katelyn Simmons is a 77 y.o. female with a history of diabetes, GERD, CKD, hypothyroidism who was sent to the ED this morning due to syncope.  She was scheduled to have elective colostomy reversal today, was doing bowel prep since yesterday and otherwise NPO.  She was feeling weak and lightheaded before bedtime, and then this morning while going to the bathroom she got lightheaded on standing and passed out, falling and landing on carpet.  She denies any headache or acute neck pain.  No paresthesias or motor weakness or vision changes.  No vomiting or fever or any other acute pain complaints.  Denies any recent exertional symptoms or chest pain.  Her procedures been rescheduled and she is sent to the ED for workup of syncope.     Physical Exam   Triage Vital Signs: ED Triage Vitals  Enc Vitals Group     BP 03/04/22 0747 (!) 144/61     Pulse Rate 03/04/22 0747 66     Resp 03/04/22 0747 18     Temp 03/04/22 0747 98.6 F (37 C)     Temp Source 03/04/22 0747 Oral     SpO2 03/04/22 0747 100 %     Weight 03/04/22 0746 186 lb 11.7 oz (84.7 kg)     Height 03/04/22 0746 '5\' 2"'$  (1.575 m)     Head Circumference --      Peak Flow --      Pain Score 03/04/22 0747 0     Pain Loc --      Pain Edu? --      Excl. in [place]? --     Most recent vital signs: Vitals:   03/04/22 0845 03/04/22 0845  BP:  (!) 148/57  Pulse: 66   Resp: 12   Temp:    SpO2: 100%     General: Awake, no distress.  CV:  Good peripheral perfusion.  Regular rate rhythm Resp:  Normal effort.  Clear to auscultation bilaterally Abd:  No distention.  Soft nontender Other:  No signs of trauma.  No scalp hematoma or laceration.  C-spine without midline tenderness.  Intact range of motion.  Cranial nerves II through XII intact.   ED Results / Procedures / Treatments   Labs (all labs  ordered are listed, but only abnormal results are displayed) Labs Reviewed  COMPREHENSIVE METABOLIC PANEL - Abnormal; Notable for the following components:      Result Value   Sodium 133 (*)    Potassium 3.2 (*)    CO2 18 (*)    Glucose, Bld 118 (*)    All other components within normal limits  CBC WITH DIFFERENTIAL/PLATELET - Abnormal; Notable for the following components:   Hemoglobin 11.4 (*)    HCT 34.8 (*)    RDW 16.3 (*)    All other components within normal limits     EKG Interpreted by me Sinus rhythm rate of 68.  Normal axis, normal intervals.  Right bundle branch block.  No acute ischemic changes.   RADIOLOGY CT head interpreted by me, negative for mass or intracranial hemorrhage.  Radiology report reviewed   PROCEDURES:  Procedures   MEDICATIONS ORDERED IN ED: Medications  potassium chloride SA (KLOR-CON M) CR tablet 40 mEq (has no administration in time range)  sodium chloride 0.9 % bolus 1,000 mL (1,000  mLs Intravenous New Bag/Given 03/04/22 0801)     IMPRESSION / MDM / ASSESSMENT AND PLAN / ED COURSE  I reviewed the triage vital signs and the nursing notes.  DDx: Dehydration, electrolyte abnormality, AKI, anemia, subdural hematoma  Patient's presentation is most consistent with acute presentation with potential threat to life or bodily function.  Patient presents with syncope, most likely due to dehydration in the setting of being n.p.o. other than osmotic bowel prep solution for GI surgery.  She has no acute complaints COVID exam is nonfocal and reassuring.  Vital signs are normal.  Will give IV fluids for hydration, check labs and CT head.   ----------------------------------------- 9:19 AM on 03/04/2022 ----------------------------------------- CT head unremarkable.  Labs are reassuring.  K is 3.2, will supplement.  Stable for discharge      FINAL CLINICAL IMPRESSION(S) / ED DIAGNOSES   Final diagnoses:  Syncope, unspecified syncope type      Rx / DC Orders   ED Discharge Orders     None        Note:  This document was prepared using Dragon voice recognition software and may include unintentional dictation errors.   Carrie Mew, MD 03/04/22 [phone]

## 2022-03-04 NOTE — Discharge Summary (Signed)
Patient ID: Katelyn Simmons MRN: [id number] DOB/AGE: 1945/04/12 77 y.o.  Admit date: 03/04/2022 Discharge date: 03/04/2022   Discharge Diagnoses:  Active Problems:   * No active hospital problems. *   Procedures:  None  Hospital Course: Patient presented to preop for her scheduled surgery.  However, she reports that she had a syncopal event at home and fell and potentially hit her head.  As a precaution, have canceled the surgery and will be sending her to the ER where she can get further evaluated.  Will postpone surgery for 03/23/22.  Consults: None  Disposition: Discharge disposition: 30-Still a Patient       Discharge Instructions     Diet general   Complete by: As directed    Resume your regular diet   Increase activity slowly   Complete by: As directed       Allergies as of 03/04/2022       Reactions   Sulfa Antibiotics Swelling   Swelling of face   Eggs Or Egg-derived Products Nausea And Vomiting   She states that she eats eggs daily with no issues   Methocarbamol Other (See Comments)   Lips felt swollen, but were not   Parafon Forte Dsc [chlorzoxazone] Other (See Comments)   Unknown reaction. Patient does not remember taking this medication   Sulfur Dioxide Other (See Comments)   Tizanidine Other (See Comments)   Codeine Other (See Comments)   Jittery        Medication List     STOP taking these medications    metroNIDAZOLE 500 MG tablet Commonly known as: FLAGYL   neomycin 500 MG tablet Commonly known as: MYCIFRADIN       TAKE these medications    acetaminophen 500 MG tablet Commonly known as: TYLENOL Take 2 tablets (1,000 mg total) by mouth every 6 (six) hours as needed for mild pain or fever.   beta carotene w/minerals tablet Take 1 tablet by mouth daily.   busPIRone 10 MG tablet Commonly known as: BUSPAR Take 10 mg by mouth 2 (two) times daily.   Cholecalciferol 25 MCG (1000 UT) capsule Take 1,000 Units by mouth every evening.    cyanocobalamin 1000 MCG tablet Commonly known as: VITAMIN B12 Take 1,000 mcg by mouth daily.   B-12 COMPLIANCE INJECTION IJ Inject as directed every 30 (thirty) days.   fenofibrate 160 MG tablet Take 160 mg by mouth at bedtime.   glucose blood test strip 1 each by Other route as needed for other. Use as instructed   [employer] by Does not apply route.   levothyroxine 125 MCG tablet Commonly known as: SYNTHROID Take 125 mcg by mouth daily before breakfast.   liothyronine 5 MCG tablet Commonly known as: CYTOMEL Take 5 mcg by mouth daily.   LUTEIN 20 PO Take 20 mg by mouth daily.   magnesium oxide 400 (240 Mg) MG tablet Commonly known as: MAG-OX Take 400 mg by mouth daily.   methotrexate 2.5 MG tablet Commonly known as: RHEUMATREX Take 20 mg by mouth once a week.   metoprolol succinate 100 MG 24 hr tablet Commonly known as: TOPROL-XL Take 100 mg by mouth daily. Take with or immediately following a meal.   pantoprazole 40 MG tablet Commonly known as: PROTONIX Take 40 mg by mouth daily.   pioglitazone 45 MG tablet Commonly known as: ACTOS Take 45 mg by mouth daily.   QUEtiapine 50 MG tablet Commonly known as: SEROQUEL Take 50 mg by mouth  at bedtime.   rosuvastatin 10 MG tablet Commonly known as: CRESTOR Take 10 mg by mouth at bedtime.   venlafaxine XR 150 MG 24 hr capsule Commonly known as: EFFEXOR-XR Take 150 mg by mouth daily with breakfast.

## 2022-03-04 NOTE — Progress Notes (Signed)
Patient evaluated by Dr. Hampton Abbot. Transported to ER 10 via stretcher with son in attendance.

## 2022-03-04 NOTE — ED Notes (Signed)
Pt currently in Room 10

## 2022-03-04 NOTE — Progress Notes (Signed)
Patient in pre-op area for surgery this morning. Relates the following story, states, " I got up to the bathroom about 2 o'clock this morning and I felt real weak. Right when I was getting back to the bed I fell and hit the back of my head." Upon questioning patient confirms that she did lose consciousness. She is not sure for how long, she lives alone. She called her son whom brought her to the ER.  States she was  not seen in ER because there were 10 patients ahead of her and she might miss her surgery.  States she has 4/10 posterior neck pain but denies other symptoms other than feeling weak.  No visible injury to back of head.  Alert and oriented x 4.  Denies headache.  Dr. Hampton Abbot notified and is on the way to hospital to evaluate patient.

## 2022-03-04 NOTE — ED Triage Notes (Signed)
Patient had syncopal episode at 0300 this morning landing on carpet. States she had a scheduled colostomy reversal this morning and has been doing her bowel prep and felt weak. Denies symptoms at this time.

## 2022-03-08 ENCOUNTER — Telehealth: Payer: Self-pay

## 2022-03-08 DIAGNOSIS — R55 Syncope and collapse: Secondary | ICD-10-CM | POA: Diagnosis not present

## 2022-03-08 DIAGNOSIS — M79641 Pain in right hand: Secondary | ICD-10-CM | POA: Diagnosis not present

## 2022-03-08 DIAGNOSIS — Z79899 Other long term (current) drug therapy: Secondary | ICD-10-CM | POA: Diagnosis not present

## 2022-03-08 NOTE — Telephone Encounter (Signed)
Patient has been advised of Pre-Admission date/time, and Surgery date at [facility]  Surgery Date: 03/23/22 Preadmission Testing Date: 03/17/22 (phone 1P-5P)  Patient has been made aware to call [phone], between 1-3:00pm the day before surgery, to find out what time to arrive for surgery.

## 2022-03-10 ENCOUNTER — Telehealth: Payer: Self-pay | Admitting: Surgery

## 2022-03-10 NOTE — Telephone Encounter (Signed)
Updated information to already scheduled surgery for 03/23/22.  Dr. Hampton Abbot wants to do a direct admit the day before so patient can be placed on IV fluids and do her bowel prep while at the hospital day before surgery.  This has been arranged, patient is aware that she will  need to arrive @ [facility] on 03/22/22 '@7'$ :45 am.  Patient is also informed to start her clear liquid diet on 03/21/22 to consist of coffee, coke, ginger ale, water, apple juice, chicken or beef broth, jello but no fruit, nothing red, no creamer no milk.  Patient encouraged to stay well hydrated and verbalized understanding.   Direct admit date: 03/22/22 '@7'$ :45 am Surgery Date: 03/23/22

## 2022-03-10 NOTE — Telephone Encounter (Signed)
Spoke with Lyssa in the preop area and she will be able to direct admit this patient to her area the day before, patient to arrive @ 7:45 am on 03/22/22.  She is asking that when we do the orders to also put "admit to surg preop"  I will call the patient and let her know of her arrival time the day before her surgery and that she will also need to start her clear liquid diet on 03/21/22.   If there are any questions for Lyssa at the hospital you can reach her at [phone].   Thank you.

## 2022-03-11 ENCOUNTER — Telehealth: Payer: Self-pay

## 2022-03-11 NOTE — Telephone Encounter (Signed)
     Patient  visit on 2/1  at [place] you been able to follow up with your primary care physician? Yes   The patient was or was not able to obtain any needed medicine or equipment. Yes   Are there diet recommendations that you are having difficulty following? na  Patient expresses understanding of discharge instructions and education provided has no other needs at this time.   Yes      [place] [phone] [address], [place], [place] [address] Phone: [phone] Email: [email or web address]

## 2022-03-15 ENCOUNTER — Telehealth: Payer: Self-pay

## 2022-03-15 ENCOUNTER — Other Ambulatory Visit: Payer: Medicare HMO

## 2022-03-15 NOTE — Patient Outreach (Signed)
Received a Pharmacy referral for Ms. Sposito for Pharmacy Assistance from the [place] Team.   I have sent this referral to the [place] team.    [facility], [place], [place] [facility] [phone]

## 2022-03-16 ENCOUNTER — Telehealth: Payer: Self-pay | Admitting: Pharmacist

## 2022-03-16 NOTE — Progress Notes (Signed)
[facility]) Care Management  [place]   03/16/2022  Katelyn Simmons November 01, 1945 [id number]  Reason for referral: medication assistance  Referral source: Care Guide Referral medication(s): All  Current insurance:[employer]  Medication Assistance Findings:  Spoke with patient. HIPAA identifiers were obtained.  Patient said she was having trouble affording some of her medications.  After review of her medications, most of them are tier one drugs that are available for a $0 copay from [facility] mail order pharmacy with the exception of:  Liothyronine 5 mcg (tier 123456 Folic Acid, Magnesium Oxide, & Cyanocobalamin-all OTC-not covered but she can use her quarterly OTC benefit Phentermine-not covered by insurance-patient paying the cash price 4. Rybelsus-tier 3 med--carries a $125 copay. Has not been filled since July 2023.  Patient said her provider held the medication due to a blockage and will address restarting after her surgery in two weeks.  Called [employer] to review the patient's copayment amounts with the patient on conference call.  Additional medication assistance options reviewed with patient as warranted:  Insurance OTC catalogue-spoke with [employer] representative who said she would send the patient out a new OTC catalogue.  Patient said she had a catalogue and that she would start ordering some of her OTC products through the catalogue versus having them filled at the pharmacy.    RX OUTREACH- She could use RX Outreach to get liothyronine for $35 per 90 day supply vs $40.50 but I do not think all the paperwork and use of an additional pharmacy is worth the $15 per year savings.  Patient Assistance Program Options-Rybelsus is available through [employer]'s program but since the patient reported it as discontinued right now, patient assistance will not be pursued at this time.    Plan: I will follow up with the patient after her surgery to see if the Rybelsus  has been restarted.    Katelyn Simmons, PharmD, [place] Clinical Pharmacist [phone]

## 2022-03-17 ENCOUNTER — Other Ambulatory Visit: Payer: Medicare HMO

## 2022-03-19 NOTE — Addendum Note (Signed)
Addended by: Gerald Leitz A on: 03/19/2022 04:23 PM   Modules accepted: Orders

## 2022-03-19 NOTE — Progress Notes (Signed)
Previous 03/04/22 surgery was cancelled and put on for 03/23/22. Voicemail message left to patient to follow previous instructions given specifically regarding the bowel prep, diet and medications and if she has any question to please call the pre-admission testing department for further instruction.

## 2022-03-22 ENCOUNTER — Inpatient Hospital Stay
Admission: RE | Admit: 2022-03-22 | Discharge: 2022-03-22 | Disposition: A | Discharge status: Home / Self Care | Payer: Medicare HMO | Source: Ambulatory Visit

## 2022-03-22 ENCOUNTER — Encounter: Payer: Self-pay | Admitting: Surgery

## 2022-03-22 ENCOUNTER — Inpatient Hospital Stay
Admission: RE | Admit: 2022-03-22 | Discharge: 2022-03-27 | DRG: 330 | Disposition: A | Discharge status: Home / Self Care | Payer: Medicare HMO | Attending: Surgery | Admitting: Surgery

## 2022-03-22 ENCOUNTER — Other Ambulatory Visit: Payer: Self-pay

## 2022-03-22 DIAGNOSIS — E039 Hypothyroidism, unspecified: Secondary | ICD-10-CM | POA: Diagnosis present

## 2022-03-22 DIAGNOSIS — K567 Ileus, unspecified: Secondary | ICD-10-CM | POA: Diagnosis not present

## 2022-03-22 DIAGNOSIS — Z433 Encounter for attention to colostomy: Principal | ICD-10-CM

## 2022-03-22 DIAGNOSIS — Z885 Allergy status to narcotic agent status: Secondary | ICD-10-CM | POA: Diagnosis not present

## 2022-03-22 DIAGNOSIS — N179 Acute kidney failure, unspecified: Secondary | ICD-10-CM | POA: Diagnosis not present

## 2022-03-22 DIAGNOSIS — E1122 Type 2 diabetes mellitus with diabetic chronic kidney disease: Secondary | ICD-10-CM | POA: Diagnosis present

## 2022-03-22 DIAGNOSIS — Z599 Problem related to housing and economic circumstances, unspecified: Secondary | ICD-10-CM | POA: Diagnosis not present

## 2022-03-22 DIAGNOSIS — I129 Hypertensive chronic kidney disease with stage 1 through stage 4 chronic kidney disease, or unspecified chronic kidney disease: Secondary | ICD-10-CM | POA: Diagnosis not present

## 2022-03-22 DIAGNOSIS — Z933 Colostomy status: Secondary | ICD-10-CM | POA: Diagnosis not present

## 2022-03-22 DIAGNOSIS — K660 Peritoneal adhesions (postprocedural) (postinfection): Secondary | ICD-10-CM | POA: Diagnosis not present

## 2022-03-22 DIAGNOSIS — N183 Chronic kidney disease, stage 3 unspecified: Secondary | ICD-10-CM | POA: Diagnosis not present

## 2022-03-22 DIAGNOSIS — Z79899 Other long term (current) drug therapy: Secondary | ICD-10-CM

## 2022-03-22 DIAGNOSIS — Z6834 Body mass index (BMI) 34.0-34.9, adult: Secondary | ICD-10-CM | POA: Diagnosis not present

## 2022-03-22 DIAGNOSIS — Z882 Allergy status to sulfonamides status: Secondary | ICD-10-CM | POA: Diagnosis not present

## 2022-03-22 DIAGNOSIS — E669 Obesity, unspecified: Secondary | ICD-10-CM | POA: Diagnosis present

## 2022-03-22 DIAGNOSIS — K760 Fatty (change of) liver, not elsewhere classified: Secondary | ICD-10-CM | POA: Diagnosis not present

## 2022-03-22 DIAGNOSIS — F32A Depression, unspecified: Secondary | ICD-10-CM | POA: Diagnosis present

## 2022-03-22 DIAGNOSIS — Z8673 Personal history of transient ischemic attack (TIA), and cerebral infarction without residual deficits: Secondary | ICD-10-CM

## 2022-03-22 DIAGNOSIS — Z888 Allergy status to other drugs, medicaments and biological substances status: Secondary | ICD-10-CM | POA: Diagnosis not present

## 2022-03-22 DIAGNOSIS — E785 Hyperlipidemia, unspecified: Secondary | ICD-10-CM | POA: Diagnosis present

## 2022-03-22 DIAGNOSIS — M797 Fibromyalgia: Secondary | ICD-10-CM | POA: Diagnosis present

## 2022-03-22 DIAGNOSIS — Z5331 Laparoscopic surgical procedure converted to open procedure: Secondary | ICD-10-CM | POA: Diagnosis not present

## 2022-03-22 DIAGNOSIS — Z79890 Hormone replacement therapy (postmenopausal): Secondary | ICD-10-CM | POA: Diagnosis not present

## 2022-03-22 DIAGNOSIS — Z4689 Encounter for fitting and adjustment of other specified devices: Secondary | ICD-10-CM | POA: Diagnosis not present

## 2022-03-22 DIAGNOSIS — K219 Gastro-esophageal reflux disease without esophagitis: Secondary | ICD-10-CM | POA: Diagnosis present

## 2022-03-22 DIAGNOSIS — Z9049 Acquired absence of other specified parts of digestive tract: Secondary | ICD-10-CM | POA: Diagnosis not present

## 2022-03-22 DIAGNOSIS — Z01812 Encounter for preprocedural laboratory examination: Principal | ICD-10-CM

## 2022-03-22 LAB — TYPE AND SCREEN
ABO/RH(D): A POS
Antibody Screen: NEGATIVE

## 2022-03-22 LAB — GLUCOSE, CAPILLARY: Glucose-Capillary: 170 mg/dL — ABNORMAL HIGH (ref 70–99)

## 2022-03-22 MED ORDER — METRONIDAZOLE 500 MG PO TABS
500.0000 mg | ORAL_TABLET | Freq: Three times a day (TID) | ORAL | Status: AC
  Administered 2022-03-22 (×3): 500 mg via ORAL
  Filled 2022-03-22 (×3): qty 1

## 2022-03-22 MED ORDER — LACTATED RINGERS IV SOLN: INTRAVENOUS | Status: DC

## 2022-03-22 MED ORDER — BISACODYL 5 MG PO TBEC
20.0000 mg | DELAYED_RELEASE_TABLET | Freq: Once | ORAL | Status: AC
  Administered 2022-03-22: 20 mg via ORAL

## 2022-03-22 MED ORDER — FLEET ENEMA 7-19 GM/118ML RE ENEM
1.0000 | ENEMA | Freq: Once | RECTAL | Status: AC
  Administered 2022-03-22: 1 via RECTAL

## 2022-03-22 MED ORDER — ORAL CARE MOUTH RINSE: 15.0000 mL | OROMUCOSAL | Status: DC | PRN

## 2022-03-22 MED ORDER — BISACODYL 5 MG PO TBEC
DELAYED_RELEASE_TABLET | ORAL | Status: AC
  Filled 2022-03-22: qty 4

## 2022-03-22 MED ORDER — ONDANSETRON 4 MG PO TBDP: 4.0000 mg | ORAL_TABLET | Freq: Four times a day (QID) | ORAL | Status: DC | PRN

## 2022-03-22 MED ORDER — POLYETHYLENE GLYCOL 3350 17 GM/SCOOP PO POWD
1.0000 | Freq: Once | ORAL | Status: AC
  Administered 2022-03-22: 255 g via ORAL
  Filled 2022-03-22: qty 255

## 2022-03-22 MED ORDER — SODIUM CHLORIDE 0.9 % IV SOLN
2.0000 g | INTRAVENOUS | Status: DC
  Filled 2022-03-22: qty 2

## 2022-03-22 MED ORDER — NEOMYCIN SULFATE 500 MG PO TABS
1000.0000 mg | ORAL_TABLET | Freq: Four times a day (QID) | ORAL | Status: AC
  Administered 2022-03-22 (×3): 1000 mg via ORAL
  Filled 2022-03-22 (×3): qty 2

## 2022-03-22 MED ORDER — ONDANSETRON HCL 4 MG/2ML IJ SOLN
4.0000 mg | Freq: Four times a day (QID) | INTRAMUSCULAR | Status: DC | PRN
  Administered 2022-03-23: 4 mg via INTRAVENOUS
  Filled 2022-03-22: qty 2

## 2022-03-22 MED ORDER — PANTOPRAZOLE SODIUM 40 MG IV SOLR
40.0000 mg | Freq: Every day | INTRAVENOUS | Status: DC
  Administered 2022-03-22 – 2022-03-25 (×4): 40 mg via INTRAVENOUS
  Filled 2022-03-22 (×4): qty 10

## 2022-03-22 NOTE — H&P (Signed)
[facility] SURGICAL HISTORY & PHYSICAL (cpt [id number])  HISTORY OF PRESENT ILLNESS (HPI):  77 y.o. female known to our service following exploratory laparotomy and Hartman's Procedure for sigmoid stricture with Dr Hampton Abbot in October 2023. She initially presented to [facility] on 02/01 for scheduled takedown. However, the day prior to surgery she did report a syncopal episode while undergoing bowel prep at home. Due to this, her procedure was cancelled. She did go to the ED at that time and work up was reassuring. She presents today (02/19) for pre-admission and bowel prep with anticipated colostomy takedown tomorrow (02/20) morning with Dr Hampton Abbot. She denied any fever, chills, nausea, emesis, abdominal pain, CP, SOB. No new labs or imaging at this time.   PAST MEDICAL HISTORY (PMH):  Past Medical History:  Diagnosis Date   Abnormal finding on EKG    Anemia    pernicious anemia   Anxiety    Arthritis    everywhere   B12 deficiency    Barrett's esophagus    CKD (chronic kidney disease)    stage III ckd   Collagen vascular disease (HCC)    RA   Depression    Diabetes mellitus without complication (HCC)    Dysphagia    Dyspnea    with exertion   Dysrhythmia    sinus arrythmia or just stops momentarily, per patient   Esophageal stricture    Fatty liver    Fibromyalgia    GERD (gastroesophageal reflux disease)    Goiter, nontoxic, multinodular    History of kidney stones    Hx of repair of left rotator cuff    Hyperlipidemia    Hypertension    Hypothyroidism    Neuromuscular disorder (HCC)    fibromyalgia   Obesity    Pain    Rotator cuff tear arthropathy of both shoulders    Shingles 2008   Stroke [facility])    mini strokes about 20 years ago.  some memory loss residual   Throat pain     Reviewed. Otherwise negative.   PAST SURGICAL HISTORY ([place]):  Past Surgical History:  Procedure Laterality Date   ABDOMINAL HYSTERECTOMY     BACK SURGERY     CHOLECYSTECTOMY      COLECTOMY WITH COLOSTOMY CREATION/HARTMANN PROCEDURE N/A 11/20/2021   Procedure: COLECTOMY WITH COLOSTOMY CREATION/HARTMANN PROCEDURE;  Surgeon: Olean Ree, MD;  Location: [facility] ORS;  Service: General;  Laterality: N/A;   COLON SURGERY     COLONOSCOPY WITH PROPOFOL N/A 01/13/2022   Procedure: COLONOSCOPY WITH PROPOFOL;  Surgeon: Lin Landsman, MD;  Location: [facility] ENDOSCOPY;  Service: Gastroenterology;  Laterality: N/A;   endoscopic carpal tunnel release Bilateral    ESOPHAGOGASTRODUODENOSCOPY     ESOPHAGOGASTRODUODENOSCOPY (EGD) WITH PROPOFOL N/A 03/31/2016   Procedure: ESOPHAGOGASTRODUODENOSCOPY (EGD) WITH PROPOFOL;  Surgeon: Manya Silvas, MD;  Location: [facility];  Service: Endoscopy;  Laterality: N/A;   ESOPHAGOGASTRODUODENOSCOPY (EGD) WITH PROPOFOL N/A 01/13/2022   Procedure: ESOPHAGOGASTRODUODENOSCOPY (EGD) WITH PROPOFOL;  Surgeon: Lin Landsman, MD;  Location: [facility] ENDOSCOPY;  Service: Gastroenterology;  Laterality: N/A;   LUMBAR LAMINECTOMY/DECOMPRESSION MICRODISCECTOMY N/A 10/25/2018   Procedure: LUMBAR LAMINECTOMY/DECOMPRESSION MICRODISCECTOMY 2 LEVELS L3-5;  Surgeon: Meade Maw, MD;  Location: [facility] ORS;  Service: Neurosurgery;  Laterality: N/A;   UPPER GI ENDOSCOPY  10/2018   light run    Reviewed. Otherwise negative.   MEDICATIONS:  Prior to Admission medications   Medication Sig Start Date End Date Taking? Authorizing Provider  acetaminophen (TYLENOL) 500 MG tablet Take 2 tablets (  1,000 mg total) by mouth every 6 (six) hours as needed for mild pain or fever. 11/26/21   Loletha Grayer, MD  beta carotene w/minerals (OCUVITE) tablet Take 1 tablet by mouth daily.    [provider]  Blood Glucose Monitoring Suppl (GMATE SMART METER KIT) DEVI by Does not apply route.    [provider]  busPIRone (BUSPAR) 10 MG tablet Take 10 mg by mouth 2 (two) times daily.    [provider]  Cholecalciferol 25 MCG (1000 UT) capsule Take  1,000 Units by mouth every evening.     [provider]  Cyanocobalamin (B-12 COMPLIANCE INJECTION IJ) Inject as directed every 30 (thirty) days.    [provider]  fenofibrate 160 MG tablet Take 160 mg by mouth at bedtime.     [provider]  glucose blood test strip 1 each by Other route as needed for other. Use as instructed    [provider]  levothyroxine (SYNTHROID) 125 MCG tablet Take 125 mcg by mouth daily before breakfast. 11/27/18   [provider]  liothyronine (CYTOMEL) 5 MCG tablet Take 5 mcg by mouth daily.    [provider]  magnesium oxide (MAG-OX) 400 (240 Mg) MG tablet Take 400 mg by mouth daily.    [provider]  methotrexate (RHEUMATREX) 2.5 MG tablet Take 20 mg by mouth once a week. 12/20/21   [provider]  metoprolol succinate (TOPROL-XL) 100 MG 24 hr tablet Take 100 mg by mouth daily. Take with or immediately following a meal.    [provider]  Misc Natural Products (LUTEIN 20 PO) Take 20 mg by mouth daily.    [provider]  pantoprazole (PROTONIX) 40 MG tablet Take 40 mg by mouth daily.    [provider]  pioglitazone (ACTOS) 45 MG tablet Take 45 mg by mouth daily.    [provider]  QUEtiapine (SEROQUEL) 50 MG tablet Take 50 mg by mouth at bedtime.    [provider]  rosuvastatin (CRESTOR) 10 MG tablet Take 10 mg by mouth at bedtime.    [provider]  venlafaxine XR (EFFEXOR-XR) 150 MG 24 hr capsule Take 150 mg by mouth daily with breakfast. 11/02/21   [provider]  vitamin B-12 (CYANOCOBALAMIN) 1000 MCG tablet Take 1,000 mcg by mouth daily.    [provider]     ALLERGIES:  Allergies  Allergen Reactions   Sulfa Antibiotics Swelling    Swelling of face   Eggs Or Egg-Derived Products Nausea And Vomiting    She states that she eats eggs daily with no issues   Methocarbamol Other (See Comments)    Lips felt  swollen, but were not   Parafon Forte Dsc [Chlorzoxazone] Other (See Comments)    Unknown reaction. Patient does not remember taking this medication   Sulfur Dioxide Other (See Comments)   Tizanidine Other (See Comments)   Codeine Other (See Comments)    Jittery     SOCIAL HISTORY:  Social History   Socioeconomic History   Marital status: Widowed    Spouse name: Jimmie   Number of children: Not on file   Years of education: Not on file   Highest education level: Not on file  Occupational History   Occupation: Management consultant, owned [employer]  Tobacco Use   Smoking status: Never   Smokeless tobacco: Never  Vaping Use   Vaping Use: Never used  Substance and Sexual Activity   Alcohol use:  Yes    Comment: rare   Drug use: No   Sexual activity: Not on file  Other Topics Concern   Not on file  Social History Narrative   Lives alone   Social Determinants of Health   Financial Resource Strain: Medium Risk (10/27/2018)   Overall Financial Resource Strain (CARDIA)    Difficulty of Paying Living Expenses: Somewhat hard  Food Insecurity: Food Insecurity Present (11/19/2021)   Hunger Vital Sign    Worried About Running Out of Food in the Last Year: Sometimes true    Ran Out of Food in the Last Year: Sometimes true  Transportation Needs: No Transportation Needs (11/19/2021)   PRAPARE - Hydrologist (Medical): No    Lack of Transportation (Non-Medical): No  Physical Activity: Not on file  Stress: Not on file  Social Connections: Not on file  Intimate Partner Violence: Not At Risk (11/19/2021)   Humiliation, Afraid, Rape, and Kick questionnaire    Fear of Current or Ex-Partner: No    Emotionally Abused: No    Physically Abused: No    Sexually Abused: No     FAMILY HISTORY:  Family History  Problem Relation Age of Onset   Breast cancer Neg Hx     Otherwise negative.   REVIEW OF SYSTEMS:  Review of Systems  Constitutional:  Negative for  chills and fever.  Respiratory:  Negative for cough and shortness of breath.   Cardiovascular:  Negative for chest pain and palpitations.  Gastrointestinal:  Negative for abdominal pain, constipation, diarrhea, nausea and vomiting.  Genitourinary:  Negative for dysuria and urgency.  All other systems reviewed and are negative.   VITAL SIGNS:                PHYSICAL EXAM:  Physical Exam Vitals and nursing note reviewed. Exam conducted with a chaperone present.  Constitutional:      General: She is not in acute distress.    Appearance: Normal appearance. She is normal weight. She is not ill-appearing.     Comments: Patient resting in bed; NAD  HENT:     Head: Normocephalic and atraumatic.  Eyes:     General: No scleral icterus.    Conjunctiva/sclera: Conjunctivae normal.  Cardiovascular:     Rate and Rhythm: Normal rate.     Pulses: Normal pulses.     Heart sounds: No murmur heard. Pulmonary:     Effort: Pulmonary effort is normal. No respiratory distress.  Abdominal:     General: Abdomen is flat. A surgical scar is present. The ostomy site is clean. There is no distension.     Palpations: Abdomen is soft.     Tenderness: There is no abdominal tenderness. There is no guarding or rebound.     Hernia: No hernia is present.     Comments: Abdomen is soft, non-tender, non-distended, no rebound/guarding. Colostomy in LLQ; ostomy pink, patent.   Genitourinary:    Comments: Deferred Musculoskeletal:     Right lower leg: No edema.     Left lower leg: No edema.  Skin:    General: Skin is warm and dry.     Coloration: Skin is not pale.     Findings: No erythema.  Neurological:     General: No focal deficit present.     Mental Status: She is alert and oriented to person, place, and time.  Psychiatric:        Mood and Affect: Mood normal.  Behavior: Behavior normal.     INTAKE/OUTPUT:  This shift: No intake/output data recorded.  Last 2 shifts: @IOLAST2SHIFTS$ @  Labs:      Latest Ref Rng & Units 03/04/2022    7:56 AM 02/24/2022    9:43 AM 12/11/2021   11:35 PM  CBC  WBC 4.0 - 10.5 K/uL 8.7  4.9  10.3   Hemoglobin 12.0 - 15.0 g/dL 11.4  10.2  8.3   Hematocrit 36.0 - 46.0 % 34.8  32.5  24.9   Platelets 150 - 400 K/uL 257  131  503       Latest Ref Rng & Units 03/04/2022    7:56 AM 02/24/2022    9:43 AM 12/11/2021   11:35 PM  CMP  Glucose 70 - 99 mg/dL 118  113  153   BUN 8 - 23 mg/dL 15  19  12   $ Creatinine 0.44 - 1.00 mg/dL 0.77  0.79  0.83   Sodium 135 - 145 mmol/L 133  137  138   Potassium 3.5 - 5.1 mmol/L 3.2  3.6  4.3   Chloride 98 - 111 mmol/L 103  104  106   CO2 22 - 32 mmol/L 18  25  26   $ Calcium 8.9 - 10.3 mg/dL 9.1  9.0  8.7   Total Protein 6.5 - 8.1 g/dL 7.6  7.0  6.9   Total Bilirubin 0.3 - 1.2 mg/dL 1.2  0.6  0.4   Alkaline Phos 38 - 126 U/L 94  82  92   AST 15 - 41 U/L 21  21  36   ALT 0 - 44 U/L 18  16  38      Imaging studies:  No new pertinent imaging studies    Assessment/Plan: (ICD-10's: Z93.9) 77 y.o. female with end colostomy who presents for bowel prep in anticipation of colostomy takedown on 02/19 with Dr Hampton Abbot    - Plan for colostomy takedown tomorrow (02/20) morning with Dr Hampton Abbot.   - All risks, benefits, and alternatives to above procedure(s) were discussed with the patient, all of her questions were answered to her expressed satisfaction, patient expresses she wishes to proceed, and informed consent was obtained.   - Okay for CLD; NPO at midnight   - Complete bowel prep today   - IV Abx on call to OR - Monitor abdominal examination; on-going bowel function    - Pain control prn; antiemetics prn  - Mobilize as tolerated  - Morning pre-op labs  All of the above findings and recommendations were discussed with the patient, and all of her questions were answered to her expressed satisfaction.  -- Edison Simon, PA-C [facility] 03/22/2022, 10:12 AM M-F: 7am - 4pm

## 2022-03-23 ENCOUNTER — Encounter: Payer: Self-pay | Admitting: Surgery

## 2022-03-23 ENCOUNTER — Inpatient Hospital Stay: Payer: Medicare HMO

## 2022-03-23 ENCOUNTER — Other Ambulatory Visit: Payer: Self-pay

## 2022-03-23 ENCOUNTER — Encounter
Admission: RE | Disposition: A | Discharge status: Home / Self Care | Payer: Self-pay | Source: Home / Self Care | Attending: Surgery

## 2022-03-23 DIAGNOSIS — K660 Peritoneal adhesions (postprocedural) (postinfection): Secondary | ICD-10-CM | POA: Diagnosis not present

## 2022-03-23 DIAGNOSIS — Z4689 Encounter for fitting and adjustment of other specified devices: Secondary | ICD-10-CM

## 2022-03-23 DIAGNOSIS — Z933 Colostomy status: Secondary | ICD-10-CM

## 2022-03-23 DIAGNOSIS — Z433 Encounter for attention to colostomy: Secondary | ICD-10-CM | POA: Diagnosis not present

## 2022-03-23 DIAGNOSIS — Z01812 Encounter for preprocedural laboratory examination: Secondary | ICD-10-CM

## 2022-03-23 HISTORY — PX: XI ROBOTIC ASSISTED COLOSTOMY TAKEDOWN: SHX6828

## 2022-03-23 HISTORY — PX: APPENDECTOMY: SHX54

## 2022-03-23 LAB — CBC
HCT: 35.5 % — ABNORMAL LOW (ref 36.0–46.0)
HCT: 40 % (ref 36.0–46.0)
Hemoglobin: 11.5 g/dL — ABNORMAL LOW (ref 12.0–15.0)
Hemoglobin: 12.9 g/dL (ref 12.0–15.0)
MCH: 27.9 pg (ref 26.0–34.0)
MCH: 28 pg (ref 26.0–34.0)
MCHC: 32.4 g/dL (ref 30.0–36.0)
MCHC: 32.3 g/dL (ref 30.0–36.0)
MCV: 86.2 fL (ref 80.0–100.0)
MCV: 86.8 fL (ref 80.0–100.0)
Platelets: 156 10*3/uL (ref 150–400)
Platelets: 226 10*3/uL (ref 150–400)
RBC: 4.12 MIL/uL (ref 3.87–5.11)
RBC: 4.61 MIL/uL (ref 3.87–5.11)
RDW: 17.2 % — ABNORMAL HIGH (ref 11.5–15.5)
RDW: 17.3 % — ABNORMAL HIGH (ref 11.5–15.5)
WBC: 6.7 10*3/uL (ref 4.0–10.5)
WBC: 16.5 10*3/uL — ABNORMAL HIGH (ref 4.0–10.5)
nRBC: 0 % (ref 0.0–0.2)
nRBC: 0 % (ref 0.0–0.2)

## 2022-03-23 LAB — BASIC METABOLIC PANEL
Anion gap: 8 (ref 5–15)
BUN: 9 mg/dL (ref 8–23)
CO2: 23 mmol/L (ref 22–32)
Calcium: 9.2 mg/dL (ref 8.9–10.3)
Chloride: 106 mmol/L (ref 98–111)
Creatinine, Ser: 0.78 mg/dL (ref 0.44–1.00)
GFR, Estimated: >60 mL/min (ref >60)
Glucose, Bld: 115 mg/dL — ABNORMAL HIGH (ref 70–99)
Potassium: 3 mmol/L — ABNORMAL LOW (ref 3.5–5.1)
Sodium: 137 mmol/L (ref 135–145)

## 2022-03-23 LAB — COMPREHENSIVE METABOLIC PANEL
ALT: 18 U/L (ref 0–44)
AST: 24 U/L (ref 15–41)
Albumin: 3 g/dL — ABNORMAL LOW (ref 3.5–5.0)
Alkaline Phosphatase: 85 U/L (ref 38–126)
Anion gap: 11 (ref 5–15)
BUN: 10 mg/dL (ref 8–23)
CO2: 19 mmol/L — ABNORMAL LOW (ref 22–32)
Calcium: 8.5 mg/dL — ABNORMAL LOW (ref 8.9–10.3)
Chloride: 108 mmol/L (ref 98–111)
Creatinine, Ser: 1.01 mg/dL — ABNORMAL HIGH (ref 0.44–1.00)
GFR, Estimated: 58 mL/min — ABNORMAL LOW (ref >60)
Glucose, Bld: 213 mg/dL — ABNORMAL HIGH (ref 70–99)
Potassium: 3.8 mmol/L (ref 3.5–5.1)
Sodium: 138 mmol/L (ref 135–145)
Total Bilirubin: 0.7 mg/dL (ref 0.3–1.2)
Total Protein: 6.7 g/dL (ref 6.5–8.1)

## 2022-03-23 LAB — GLUCOSE, CAPILLARY
Glucose-Capillary: 118 mg/dL — ABNORMAL HIGH (ref 70–99)
Glucose-Capillary: 149 mg/dL — ABNORMAL HIGH (ref 70–99)
Glucose-Capillary: 165 mg/dL — ABNORMAL HIGH (ref 70–99)

## 2022-03-23 LAB — MAGNESIUM
Magnesium: 1.5 mg/dL — ABNORMAL LOW (ref 1.7–2.4)
Magnesium: 1.8 mg/dL (ref 1.7–2.4)

## 2022-03-23 SURGERY — CLOSURE, COLOSTOMY, ROBOT-ASSISTED
Anesthesia: General

## 2022-03-23 MED ORDER — MIDAZOLAM HCL 2 MG/2ML IJ SOLN
INTRAMUSCULAR | Status: DC | PRN
  Administered 2022-03-23: 2 mg via INTRAVENOUS

## 2022-03-23 MED ORDER — INDOCYANINE GREEN 25 MG IV SOLR
INTRAVENOUS | Status: DC | PRN
  Administered 2022-03-23: 25 mg

## 2022-03-23 MED ORDER — POLYVINYL ALCOHOL 1.4 % OP SOLN
1.0000 [drp] | OPHTHALMIC | Status: DC | PRN
  Administered 2022-03-23: 1 [drp] via OPHTHALMIC
  Filled 2022-03-23: qty 15

## 2022-03-23 MED ORDER — EPINEPHRINE PF 1 MG/ML IJ SOLN
INTRAMUSCULAR | Status: AC
  Filled 2022-03-23: qty 1

## 2022-03-23 MED ORDER — SODIUM CHLORIDE 0.9 % IV SOLN
2.0000 g | Freq: Two times a day (BID) | INTRAVENOUS | Status: AC
  Administered 2022-03-23 – 2022-03-26 (×6): 2 g via INTRAVENOUS
  Filled 2022-03-23 (×6): qty 2

## 2022-03-23 MED ORDER — PROPOFOL 10 MG/ML IV BOLUS
INTRAVENOUS | Status: AC
  Filled 2022-03-23: qty 20

## 2022-03-23 MED ORDER — BUPIVACAINE LIPOSOME 1.3 % IJ SUSP
INTRAMUSCULAR | Status: AC
  Filled 2022-03-23: qty 20

## 2022-03-23 MED ORDER — KETOROLAC TROMETHAMINE 30 MG/ML IJ SOLN
15.0000 mg | Freq: Four times a day (QID) | INTRAMUSCULAR | Status: DC
  Administered 2022-03-24 – 2022-03-25 (×5): 15 mg via INTRAVENOUS
  Filled 2022-03-23 (×5): qty 1

## 2022-03-23 MED ORDER — ALVIMOPAN 12 MG PO CAPS
12.0000 mg | ORAL_CAPSULE | ORAL | Status: DC
  Filled 2022-03-23: qty 1

## 2022-03-23 MED ORDER — ROCURONIUM BROMIDE 100 MG/10ML IV SOLN
INTRAVENOUS | Status: DC | PRN
  Administered 2022-03-23: 20 mg via INTRAVENOUS
  Administered 2022-03-23: 40 mg via INTRAVENOUS
  Administered 2022-03-23 (×3): 20 mg via INTRAVENOUS

## 2022-03-23 MED ORDER — ONDANSETRON HCL 4 MG/2ML IJ SOLN
INTRAMUSCULAR | Status: AC
  Filled 2022-03-23: qty 2

## 2022-03-23 MED ORDER — KETAMINE HCL 50 MG/5ML IJ SOSY
PREFILLED_SYRINGE | INTRAMUSCULAR | Status: AC
  Filled 2022-03-23: qty 5

## 2022-03-23 MED ORDER — MAGNESIUM SULFATE 2 GM/50ML IV SOLN
2.0000 g | Freq: Once | INTRAVENOUS | Status: AC
  Administered 2022-03-23: 2 g via INTRAVENOUS
  Filled 2022-03-23: qty 50

## 2022-03-23 MED ORDER — SUGAMMADEX SODIUM 200 MG/2ML IV SOLN
INTRAVENOUS | Status: DC | PRN
  Administered 2022-03-23: 200 mg via INTRAVENOUS

## 2022-03-23 MED ORDER — LACTATED RINGERS IV SOLN: INTRAVENOUS | Status: DC

## 2022-03-23 MED ORDER — BUPIVACAINE HCL (PF) 0.25 % IJ SOLN
INTRAMUSCULAR | Status: AC
  Filled 2022-03-23: qty 30

## 2022-03-23 MED ORDER — FENOFIBRATE 160 MG PO TABS
160.0000 mg | ORAL_TABLET | Freq: Every day | ORAL | Status: DC
  Administered 2022-03-23 – 2022-03-26 (×4): 160 mg via ORAL
  Filled 2022-03-23 (×4): qty 1

## 2022-03-23 MED ORDER — FENTANYL CITRATE (PF) 100 MCG/2ML IJ SOLN
INTRAMUSCULAR | Status: DC | PRN
  Administered 2022-03-23 (×2): 25 ug via INTRAVENOUS
  Administered 2022-03-23: 50 ug via INTRAVENOUS
  Administered 2022-03-23 (×2): 25 ug via INTRAVENOUS
  Administered 2022-03-23: 100 ug via INTRAVENOUS
  Administered 2022-03-23: 50 ug via INTRAVENOUS

## 2022-03-23 MED ORDER — LIDOCAINE HCL (PF) 2 % IJ SOLN
INTRAMUSCULAR | Status: AC
  Filled 2022-03-23: qty 5

## 2022-03-23 MED ORDER — KETAMINE HCL 10 MG/ML IJ SOLN
INTRAMUSCULAR | Status: DC | PRN
  Administered 2022-03-23 (×2): 10 mg via INTRAVENOUS
  Administered 2022-03-23 (×2): 20 mg via INTRAVENOUS
  Administered 2022-03-23 (×2): 10 mg via INTRAVENOUS
  Administered 2022-03-23: 20 mg via INTRAVENOUS

## 2022-03-23 MED ORDER — ROCURONIUM BROMIDE 10 MG/ML (PF) SYRINGE
PREFILLED_SYRINGE | INTRAVENOUS | Status: AC
  Filled 2022-03-23: qty 10

## 2022-03-23 MED ORDER — PROPOFOL 10 MG/ML IV BOLUS
INTRAVENOUS | Status: DC | PRN
  Administered 2022-03-23: 30 mg via INTRAVENOUS
  Administered 2022-03-23: 20 mg via INTRAVENOUS
  Administered 2022-03-23: 120 mg via INTRAVENOUS

## 2022-03-23 MED ORDER — POTASSIUM CHLORIDE 10 MEQ/100ML IV SOLN
10.0000 meq | INTRAVENOUS | Status: AC
  Administered 2022-03-23 (×2): 10 meq via INTRAVENOUS
  Filled 2022-03-23 (×2): qty 100

## 2022-03-23 MED ORDER — BUSPIRONE HCL 10 MG PO TABS
10.0000 mg | ORAL_TABLET | Freq: Two times a day (BID) | ORAL | Status: DC
  Administered 2022-03-23 – 2022-03-27 (×8): 10 mg via ORAL
  Filled 2022-03-23 (×8): qty 1

## 2022-03-23 MED ORDER — DEXMEDETOMIDINE HCL IN NACL 80 MCG/20ML IV SOLN
INTRAVENOUS | Status: AC
  Filled 2022-03-23: qty 20

## 2022-03-23 MED ORDER — INSULIN ASPART 100 UNIT/ML IJ SOLN
0.0000 [IU] | INTRAMUSCULAR | Status: DC
  Administered 2022-03-24 – 2022-03-26 (×4): 2 [IU] via SUBCUTANEOUS
  Administered 2022-03-27: 3 [IU] via SUBCUTANEOUS
  Filled 2022-03-23 (×5): qty 1

## 2022-03-23 MED ORDER — FENTANYL CITRATE (PF) 100 MCG/2ML IJ SOLN
INTRAMUSCULAR | Status: AC
  Filled 2022-03-23: qty 2

## 2022-03-23 MED ORDER — SODIUM CHLORIDE 0.9 % IV SOLN
INTRAVENOUS | Status: AC
  Filled 2022-03-23: qty 2

## 2022-03-23 MED ORDER — CHLORHEXIDINE GLUCONATE 0.12 % MT SOLN: 15.0000 mL | Freq: Once | OROMUCOSAL | Status: DC

## 2022-03-23 MED ORDER — HYDROMORPHONE HCL 1 MG/ML IJ SOLN
0.5000 mg | INTRAMUSCULAR | Status: DC | PRN
  Administered 2022-03-23 – 2022-03-25 (×3): 0.5 mg via INTRAVENOUS
  Filled 2022-03-23 (×3): qty 0.5

## 2022-03-23 MED ORDER — LEVOTHYROXINE SODIUM 50 MCG PO TABS
125.0000 ug | ORAL_TABLET | Freq: Every day | ORAL | Status: DC
  Administered 2022-03-24 – 2022-03-27 (×4): 125 ug via ORAL
  Filled 2022-03-23 (×4): qty 1

## 2022-03-23 MED ORDER — ACETAMINOPHEN 500 MG PO TABS: 1000.0000 mg | ORAL_TABLET | Freq: Four times a day (QID) | ORAL | Status: DC | PRN

## 2022-03-23 MED ORDER — ONDANSETRON HCL 4 MG/2ML IJ SOLN: 4.0000 mg | Freq: Once | INTRAMUSCULAR | Status: AC | PRN

## 2022-03-23 MED ORDER — DEXAMETHASONE SODIUM PHOSPHATE 10 MG/ML IJ SOLN
INTRAMUSCULAR | Status: AC
  Filled 2022-03-23: qty 1

## 2022-03-23 MED ORDER — ALVIMOPAN 12 MG PO CAPS
12.0000 mg | ORAL_CAPSULE | ORAL | Status: AC
  Administered 2022-03-23: 12 mg via ORAL
  Filled 2022-03-23: qty 1

## 2022-03-23 MED ORDER — SUCCINYLCHOLINE CHLORIDE 200 MG/10ML IV SOSY
PREFILLED_SYRINGE | INTRAVENOUS | Status: DC | PRN
  Administered 2022-03-23: 100 mg via INTRAVENOUS

## 2022-03-23 MED ORDER — SUCCINYLCHOLINE CHLORIDE 200 MG/10ML IV SOSY
PREFILLED_SYRINGE | INTRAVENOUS | Status: AC
  Filled 2022-03-23: qty 10

## 2022-03-23 MED ORDER — ACETAMINOPHEN 10 MG/ML IV SOLN
INTRAVENOUS | Status: DC | PRN
  Administered 2022-03-23: 1000 mg via INTRAVENOUS

## 2022-03-23 MED ORDER — ACETAMINOPHEN 10 MG/ML IV SOLN: 1000.0000 mg | Freq: Once | INTRAVENOUS | Status: DC | PRN

## 2022-03-23 MED ORDER — SODIUM CHLORIDE (PF) 0.9 % IJ SOLN
INTRAMUSCULAR | Status: AC
  Filled 2022-03-23: qty 50

## 2022-03-23 MED ORDER — METOPROLOL SUCCINATE ER 100 MG PO TB24
100.0000 mg | ORAL_TABLET | Freq: Every day | ORAL | Status: DC
  Administered 2022-03-23 – 2022-03-26 (×4): 100 mg via ORAL
  Filled 2022-03-23 (×4): qty 1

## 2022-03-23 MED ORDER — SODIUM CHLORIDE 0.9 % IR SOLN
Status: DC | PRN
  Administered 2022-03-23: 100 mL

## 2022-03-23 MED ORDER — ACETAMINOPHEN 10 MG/ML IV SOLN
INTRAVENOUS | Status: AC
  Filled 2022-03-23: qty 100

## 2022-03-23 MED ORDER — ORAL CARE MOUTH RINSE: 15.0000 mL | Freq: Once | OROMUCOSAL | Status: DC

## 2022-03-23 MED ORDER — DEXMEDETOMIDINE HCL IN NACL 80 MCG/20ML IV SOLN
INTRAVENOUS | Status: DC | PRN
  Administered 2022-03-23 (×4): 8 ug via BUCCAL
  Administered 2022-03-23 (×2): 4 ug via BUCCAL

## 2022-03-23 MED ORDER — ALVIMOPAN 12 MG PO CAPS
12.0000 mg | ORAL_CAPSULE | Freq: Two times a day (BID) | ORAL | Status: DC
  Administered 2022-03-24 (×2): 12 mg via ORAL
  Filled 2022-03-23 (×3): qty 1

## 2022-03-23 MED ORDER — SODIUM CHLORIDE 0.9 % IV SOLN: INTRAVENOUS | Status: DC

## 2022-03-23 MED ORDER — EPHEDRINE SULFATE (PRESSORS) 50 MG/ML IJ SOLN
INTRAMUSCULAR | Status: DC | PRN
  Administered 2022-03-23 (×3): 5 mg via INTRAVENOUS

## 2022-03-23 MED ORDER — OXYCODONE HCL 5 MG/5ML PO SOLN: 5.0000 mg | Freq: Once | ORAL | Status: DC | PRN

## 2022-03-23 MED ORDER — HEMOSTATIC AGENTS (NO CHARGE) OPTIME
TOPICAL | Status: DC | PRN
  Administered 2022-03-23: 1 via TOPICAL

## 2022-03-23 MED ORDER — VITAMIN B-12 1000 MCG PO TABS
1000.0000 ug | ORAL_TABLET | Freq: Every day | ORAL | Status: DC
  Administered 2022-03-23 – 2022-03-27 (×5): 1000 ug via ORAL
  Filled 2022-03-23 (×5): qty 1

## 2022-03-23 MED ORDER — FENTANYL CITRATE (PF) 100 MCG/2ML IJ SOLN: 25.0000 ug | INTRAMUSCULAR | Status: DC | PRN

## 2022-03-23 MED ORDER — SODIUM CHLORIDE (PF) 0.9 % IJ SOLN
INTRAMUSCULAR | Status: DC | PRN
  Administered 2022-03-23: 80 mL

## 2022-03-23 MED ORDER — VENLAFAXINE HCL ER 75 MG PO CP24
150.0000 mg | ORAL_CAPSULE | Freq: Every day | ORAL | Status: DC
  Administered 2022-03-24 – 2022-03-27 (×4): 150 mg via ORAL
  Filled 2022-03-23 (×4): qty 2

## 2022-03-23 MED ORDER — ONDANSETRON HCL 4 MG/2ML IJ SOLN
INTRAMUSCULAR | Status: DC | PRN
  Administered 2022-03-23: 4 mg via INTRAVENOUS

## 2022-03-23 MED ORDER — LIOTHYRONINE SODIUM 5 MCG PO TABS
5.0000 ug | ORAL_TABLET | Freq: Every day | ORAL | Status: DC
  Administered 2022-03-23 – 2022-03-27 (×5): 5 ug via ORAL
  Filled 2022-03-23 (×5): qty 1

## 2022-03-23 MED ORDER — LABETALOL HCL 5 MG/ML IV SOLN
INTRAVENOUS | Status: AC
  Filled 2022-03-23: qty 4

## 2022-03-23 MED ORDER — FOLIC ACID 1 MG PO TABS
1.0000 mg | ORAL_TABLET | Freq: Every day | ORAL | Status: DC
  Administered 2022-03-23 – 2022-03-27 (×5): 1 mg via ORAL
  Filled 2022-03-23 (×5): qty 1

## 2022-03-23 MED ORDER — PROSIGHT PO TABS
1.0000 | ORAL_TABLET | Freq: Every day | ORAL | Status: DC
  Administered 2022-03-24 – 2022-03-27 (×4): 1 via ORAL
  Filled 2022-03-23 (×4): qty 1

## 2022-03-23 MED ORDER — OXYCODONE HCL 5 MG PO TABS: 5.0000 mg | ORAL_TABLET | Freq: Once | ORAL | Status: DC | PRN

## 2022-03-23 MED ORDER — SODIUM CHLORIDE 0.9 % IV SOLN
2.0000 g | INTRAVENOUS | Status: AC
  Administered 2022-03-23 (×2): 2 g via INTRAVENOUS

## 2022-03-23 MED ORDER — LIDOCAINE HCL (CARDIAC) PF 100 MG/5ML IV SOSY
PREFILLED_SYRINGE | INTRAVENOUS | Status: DC | PRN
  Administered 2022-03-23: 100 mg via INTRAVENOUS
  Administered 2022-03-23 (×2): 20 mg via INTRAVENOUS

## 2022-03-23 MED ORDER — DEXAMETHASONE SODIUM PHOSPHATE 10 MG/ML IJ SOLN
INTRAMUSCULAR | Status: DC | PRN
  Administered 2022-03-23 (×2): 10 mg via INTRAVENOUS

## 2022-03-23 MED ORDER — MIDAZOLAM HCL 2 MG/2ML IJ SOLN
INTRAMUSCULAR | Status: AC
  Filled 2022-03-23: qty 2

## 2022-03-23 MED ORDER — 0.9 % SODIUM CHLORIDE (POUR BTL) OPTIME
TOPICAL | Status: DC | PRN
  Administered 2022-03-23: 150 mL
  Administered 2022-03-23: 1000 mL

## 2022-03-23 MED ORDER — ONDANSETRON HCL 4 MG/2ML IJ SOLN
INTRAMUSCULAR | Status: AC
  Administered 2022-03-23: 4 mg via INTRAVENOUS
  Filled 2022-03-23: qty 2

## 2022-03-23 MED ORDER — QUETIAPINE FUMARATE 25 MG PO TABS
50.0000 mg | ORAL_TABLET | Freq: Every day | ORAL | Status: DC
  Administered 2022-03-23 – 2022-03-26 (×4): 50 mg via ORAL
  Filled 2022-03-23 (×4): qty 2

## 2022-03-23 MED ORDER — PHENYLEPHRINE 80 MCG/ML (10ML) SYRINGE FOR IV PUSH (FOR BLOOD PRESSURE SUPPORT)
PREFILLED_SYRINGE | INTRAVENOUS | Status: DC | PRN
  Administered 2022-03-23: 80 ug via INTRAVENOUS
  Administered 2022-03-23 (×2): 160 ug via INTRAVENOUS

## 2022-03-23 SURGICAL SUPPLY — 92 items
CANNULA REDUC XI 12-8 STAPL (CANNULA) ×3
CANNULA REDUCER 12-8 DVNC XI (CANNULA) ×3 IMPLANT
CATH FOL LX CONE TIP  8F (CATHETERS) ×3
CATH FOL LX CONE TIP 8F (CATHETERS) ×3 IMPLANT
CATH FOLEY 2WAY  5CC 16FR (CATHETERS) ×3
CATH ROBINSON RED A/P 14FR (CATHETERS) IMPLANT
CATH URETL OPEN 5X70 (CATHETERS) IMPLANT
CATH URTH 16FR FL 2W BLN LF (CATHETERS) ×3 IMPLANT
COVER MAYO STAND REUSABLE (DRAPES) IMPLANT
DERMABOND ADVANCED .7 DNX12 (GAUZE/BANDAGES/DRESSINGS) ×3 IMPLANT
DRAIN PENROSE 12X.25 LTX STRL (MISCELLANEOUS) IMPLANT
DRAPE ARM DVNC X/XI (DISPOSABLE) ×12 IMPLANT
DRAPE COLUMN DVNC XI (DISPOSABLE) ×3 IMPLANT
DRAPE DA VINCI XI ARM (DISPOSABLE) ×12
DRAPE DA VINCI XI COLUMN (DISPOSABLE) ×3
DRAPE LEGGINS SURG 28X43 STRL (DRAPES) ×3 IMPLANT
DRAPE UNDER BUTTOCK W/FLU (DRAPES) ×3 IMPLANT
DRSG OPSITE POSTOP 4X10 (GAUZE/BANDAGES/DRESSINGS) IMPLANT
DRSG OPSITE POSTOP 4X6 (GAUZE/BANDAGES/DRESSINGS) IMPLANT
ELECT CAUTERY BLADE TIP 2.5 (TIP) ×3
ELECT EZSTD 165MM 6.5IN (MISCELLANEOUS) ×3
ELECT REM PT RETURN 9FT ADLT (ELECTROSURGICAL) ×3
ELECTRODE CAUTERY BLDE TIP 2.5 (TIP) ×3 IMPLANT
ELECTRODE EZSTD 165MM 6.5IN (MISCELLANEOUS) IMPLANT
ELECTRODE REM PT RTRN 9FT ADLT (ELECTROSURGICAL) ×3 IMPLANT
GLOVE SURG SYN 7.0 (GLOVE) ×9 IMPLANT
GLOVE SURG SYN 7.0 PF PI (GLOVE) ×9 IMPLANT
GLOVE SURG SYN 7.5  E (GLOVE) ×9
GLOVE SURG SYN 7.5 E (GLOVE) ×9 IMPLANT
GLOVE SURG SYN 7.5 PF PI (GLOVE) ×9 IMPLANT
GLOVE SURG UNDER POLY LF SZ7.5 (GLOVE) ×3 IMPLANT
GOWN STRL REUS W/ TWL LRG LVL3 (GOWN DISPOSABLE) ×18 IMPLANT
GOWN STRL REUS W/TWL LRG LVL3 (GOWN DISPOSABLE) ×18
GRASPER LAPSCPC 5X45 DSP (INSTRUMENTS) IMPLANT
HANDLE YANKAUER SUCT BULB TIP (MISCELLANEOUS) ×3 IMPLANT
HEMOSTAT ARISTA ABSORB 1G (HEMOSTASIS) IMPLANT
IV NS 1000ML (IV SOLUTION) ×3
IV NS 1000ML BAXH (IV SOLUTION) IMPLANT
KIT IMAGING PINPOINTPAQ (MISCELLANEOUS) IMPLANT
KIT PINK PAD W/HEAD ARE REST (MISCELLANEOUS) ×3
KIT PINK PAD W/HEAD ARM REST (MISCELLANEOUS) ×3 IMPLANT
LABEL OR SOLS (LABEL) ×3 IMPLANT
LIGASURE IMPACT 36 18CM CVD LR (INSTRUMENTS) IMPLANT
MANIFOLD NEPTUNE II (INSTRUMENTS) ×6 IMPLANT
NDL INSUFFLATION 14GA 120MM (NEEDLE) ×3 IMPLANT
NEEDLE HYPO 22GX1.5 SAFETY (NEEDLE) ×3 IMPLANT
NEEDLE INSUFFLATION 14GA 120MM (NEEDLE) ×3 IMPLANT
NS IRRIG 500ML POUR BTL (IV SOLUTION) ×3 IMPLANT
OBTURATOR OPTICAL STANDARD 8MM (TROCAR)
OBTURATOR OPTICAL STND 8 DVNC (TROCAR)
OBTURATOR OPTICALSTD 8 DVNC (TROCAR) IMPLANT
PACK COLON CLEAN CLOSURE (MISCELLANEOUS) IMPLANT
PACK CYSTO AR (MISCELLANEOUS) ×3 IMPLANT
PACK LAP CHOLECYSTECTOMY (MISCELLANEOUS) ×3 IMPLANT
PAD PREP 24X41 OB/GYN DISP (PERSONAL CARE ITEMS) ×3 IMPLANT
RETRACTOR WOUND ALXS 18CM SML (MISCELLANEOUS) IMPLANT
RTRCTR WOUND ALEXIS O 18CM SML (MISCELLANEOUS) ×3
SEAL CANN UNIV 5-8 DVNC XI (MISCELLANEOUS) ×9 IMPLANT
SEAL XI 5MM-8MM UNIVERSAL (MISCELLANEOUS) ×9
SET CYSTO W/LG BORE CLAMP LF (SET/KITS/TRAYS/PACK) ×3 IMPLANT
SOL ELECTROSURG ANTI STICK (MISCELLANEOUS) ×3
SOL PREP PVP 2OZ (MISCELLANEOUS) ×9
SOLUTION ELECTROSURG ANTI STCK (MISCELLANEOUS) ×3 IMPLANT
SOLUTION PREP PVP 2OZ (MISCELLANEOUS) ×3 IMPLANT
SPIKE FLUID TRANSFER (MISCELLANEOUS) ×3 IMPLANT
SPONGE T-LAP 18X18 ~~LOC~~+RFID (SPONGE) IMPLANT
STAPLER CANNULA SEAL DVNC XI (STAPLE) ×3 IMPLANT
STAPLER CANNULA SEAL XI (STAPLE) ×3
STAPLER CIRCULAR MANUAL XL 29 (STAPLE) IMPLANT
STAPLER PROXIMATE 55 BLUE (STAPLE) IMPLANT
STAPLER SKIN PROX 35W (STAPLE) IMPLANT
SURGILUBE 2OZ TUBE FLIPTOP (MISCELLANEOUS) ×6 IMPLANT
SUT MNCRL AB 4-0 PS2 18 (SUTURE) ×3 IMPLANT
SUT PDS AB 1 CT1 36 (SUTURE) IMPLANT
SUT PROLENE 2 0 SH DA (SUTURE) IMPLANT
SUT SILK 2 0 (SUTURE) ×3
SUT SILK 2 0 SH (SUTURE) IMPLANT
SUT SILK 2-0 18XBRD TIE 12 (SUTURE) IMPLANT
SUT SILK 3-0 (SUTURE) IMPLANT
SUT VIC AB 2-0 SH 27 (SUTURE) ×3
SUT VIC AB 2-0 SH 27XBRD (SUTURE) IMPLANT
SUT VIC AB 3-0 SH 27 (SUTURE) ×6
SUT VIC AB 3-0 SH 27X BRD (SUTURE) IMPLANT
SUT VICRYL+ 3-0 36IN CT-1 (SUTURE) IMPLANT
SYR 10ML LL (SYRINGE) ×6 IMPLANT
SYR TOOMEY IRRIG 70ML (MISCELLANEOUS) ×3
SYRINGE TOOMEY IRRIG 70ML (MISCELLANEOUS) IMPLANT
SYS TROCAR 1.5-3 SLV ABD GEL (ENDOMECHANICALS) ×6
SYSTEM TROCR 1.5-3 SLV ABD GEL (ENDOMECHANICALS) IMPLANT
TUBING EVAC SMOKE HEATED PNEUM (TUBING) ×3 IMPLANT
WATER STERILE IRR 3000ML UROMA (IV SOLUTION) ×3 IMPLANT
WATER STERILE IRR 500ML POUR (IV SOLUTION) ×6 IMPLANT

## 2022-03-23 NOTE — Progress Notes (Signed)
Pt moved to pre op via bed by OR nurse, son updated on location of patient and is on the way to the patients room to wait.

## 2022-03-23 NOTE — Interval H&P Note (Signed)
History and Physical Interval Note:  03/23/2022 7:09 AM  Katelyn Simmons  has presented today for surgery, with the diagnosis of colostomy in place.  The various methods of treatment have been discussed with the patient and family. After consideration of risks, benefits and other options for treatment, the patient has consented to  Procedure(s): XI ROBOTIC ASSISTED COLOSTOMY TAKEDOWN, Otho Ket PA-C to assist (N/A) DIVERTING ILEOSTOMY, possible loop (N/A) INDOCYANINE GREEN FLUORESCENCE IMAGING (ICG) (Bilateral) as a surgical intervention.  The patient's history has been reviewed, patient examined, no change in status, stable for surgery.  I have reviewed the patient's chart and labs.  Questions were answered to the patient's satisfaction.     [name]

## 2022-03-23 NOTE — Anesthesia Preprocedure Evaluation (Addendum)
Anesthesia Evaluation  Patient identified by MRN, date of birth, ID band Patient awake    Reviewed: Allergy & Precautions, NPO status , Patient's Chart, lab work & pertinent test results  History of Anesthesia Complications Negative for: history of anesthetic complications  Airway Mallampati: I   Neck ROM: Full    Dental  (+) Missing   Pulmonary neg pulmonary ROS   Pulmonary exam normal breath sounds clear to auscultation       Cardiovascular hypertension, Normal cardiovascular exam Rhythm:Regular Rate:Normal  ECG 03/04/22:  Sinus rhythm Borderline prolonged PR interval Right bundle branch block  Echo 06/2021:   1. Left ventricular ejection fraction, by estimation, is 60 to 65%. The left ventricle has normal function. The left ventricle has no regional wall motion abnormalities. Left ventricular diastolic parameters are consistent with Grade I diastolic dysfunction (impaired relaxation).   2. Right ventricular systolic function is normal. The right ventricular size is normal.   3. The mitral valve is normal in structure. No evidence of mitral valve regurgitation. No evidence of mitral stenosis.   4. The aortic valve has an indeterminant number of cusps. Aortic valve regurgitation is not visualized. No aortic stenosis is present.   5. The inferior vena cava is normal in size with greater than 50% respiratory variability, suggesting right atrial pressure of 3 mmHg.   6. Agitated saline contrast bubble study was negative, with no evidence of any interatrial shunt.   Echo 07/11/18:  NORMAL LEFT VENTRICULAR SYSTOLIC FUNCTION  NORMAL RIGHT VENTRICULAR SYSTOLIC FUNCTION  MILD VALVULAR REGURGITATION  NO VALVULAR STENOSIS    Neuro/Psych  PSYCHIATRIC DISORDERS Anxiety Depression    CVA (residual memory loss)    GI/Hepatic ,GERD  ,,  Endo/Other  diabetes, Type 2Hypothyroidism  Obesity   Renal/GU Renal disease (stage III CKD;  nephrolithiasis)     Musculoskeletal  (+) Arthritis , Osteoarthritis and Rheumatoid disorders,  Fibromyalgia -  Abdominal   Peds  Hematology negative hematology ROS (+)   Anesthesia Other Findings   Reproductive/Obstetrics                             Anesthesia Physical Anesthesia Plan  ASA: 3  Anesthesia Plan: General   Post-op Pain Management:    Induction: Intravenous  PONV Risk Score and Plan: 3 and Ondansetron, Dexamethasone and Treatment may vary due to age or medical condition  Airway Management Planned: Oral ETT  Additional Equipment:   Intra-op Plan:   Post-operative Plan: Extubation in OR  Informed Consent: I have reviewed the patients History and Physical, chart, labs and discussed the procedure including the risks, benefits and alternatives for the proposed anesthesia with the patient or authorized representative who has indicated his/her understanding and acceptance.     Dental advisory given  Plan Discussed with: CRNA  Anesthesia Plan Comments: (Patient consented for risks of anesthesia including but not limited to:  - adverse reactions to medications - damage to eyes, teeth, lips or other oral mucosa - nerve damage due to positioning  - sore throat or hoarseness - damage to heart, brain, nerves, lungs, other parts of body or loss of life  Informed patient about role of CRNA in peri- and intra-operative care.  Patient voiced understanding.)        Anesthesia Quick Evaluation

## 2022-03-23 NOTE — Brief Op Note (Signed)
03/23/2022  2:38 PM  PATIENT:  Katelyn Simmons  77 y.o. female  PRE-OPERATIVE DIAGNOSIS:  colostomy in place  POST-OPERATIVE DIAGNOSIS:  colostomy in place  PROCEDURE:  Procedure(s): XI ROBOTIC ASSISTED COLOSTOMY TAKEDOWN CONVERTED TO OPEN, REPAIR OF ENTEROTOMY, MOBILIZATION OF SPLENIC FLEXURE, LYSIS OF ADHESIONS (N/A) INDOCYANINE GREEN FLUORESCENCE IMAGING (ICG) (Bilateral) APPENDECTOMY  SURGEON:  Surgeon(s) and Role: Panel 1:    * Paxton Kanaan, MD - Primary Panel 2:    * Abbie Sons, MD - Primary  PHYSICIAN ASSISTANT:  Edison Simon, PA-C  ANESTHESIA:   general  EBL:  100 mL   BLOOD ADMINISTERED:none  DRAINS: Penrose drain in the midline incision and ostomy site    LOCAL MEDICATIONS USED:  BUPIVICAINE   SPECIMEN:  Source of Specimen:  1. End colostomy, 2. Appendix, 3.  Omentum, 4. Anastomotic donuts  DISPOSITION OF SPECIMEN: Pathology  COUNTS:  YES  DICTATION: .Dragon Dictation  PLAN OF CARE: Admit to inpatient   PATIENT DISPOSITION:  PACU - hemodynamically stable.   Delay start of Pharmacological VTE agent (>24hrs) due to surgical blood loss or risk of bleeding: yes

## 2022-03-23 NOTE — Progress Notes (Signed)
Pt arrived from [place]. Assessment done, VSS. Plan of care was reviewed with patient and family. Abdominal dressing is clean, dry and intact. Foley care provided.

## 2022-03-23 NOTE — Op Note (Signed)
   Preoperative diagnosis:  Colostomy in place  Postoperative diagnosis:        1.  Same   Procedure: Cystoscopy with bilateral ureteral catheterization Instillation ureteral indocyanine green-bilateral  Surgeon: Abbie Sons, MD  Anesthesia: General  Complications: None  Intraoperative findings:  Cystoscopy-urethra normal. Bladder mucosa without erythema, solid or papillary lesions.  UOs normal-appearing bilaterally.  EBL: None  Specimens: None  Indication: Katelyn Simmons is a 77 y.o. scheduled for robotic assisted colostomy takedown. Instillation of intraureteral ICG has been requested to aid in intraoperative ureteral localization.  Informed consent has been obtained.  Description of procedure:  The patient was taken to the operating room and general anesthesia was induced.  The patient was placed in the dorsal lithotomy position, prepped and draped in the usual sterile fashion, and preoperative antibiotics were administered. A preoperative time-out was performed.   A 21 French scope was lubricated and passed per urethra.  Performed with findings as described above  A 51F open-ended ureteral catheter was placed through the working channel of the cystoscope and advanced into the left ureteral orifice.  10 mL of ICG was instilled without problems.  An identical procedure was performed on the contralateral side.  A 16 French Foley catheter was placed and the balloon was inflated with 10 cc of sterile water.  The catheter was placed to gravity drainage.  The patient was reprepped and draped for her endoscopic assisted colostomy takedown which will be dictated separately by Dr. Marny Lowenstein, M.D.

## 2022-03-23 NOTE — Anesthesia Postprocedure Evaluation (Signed)
Anesthesia Post Note  Patient: Katelyn Simmons  Procedure(s) Performed: XI ROBOTIC ASSISTED COLOSTOMY TAKEDOWN CONVERTED TO OPEN, REPAIR OF ENTEROTOMY, MOBILIZATION OF SPLENIC FLEXURE, LYSIS OF ADHESIONS APPENDECTOMY INDOCYANINE GREEN FLUORESCENCE IMAGING (ICG) (Bilateral)  Patient location during evaluation: PACU Anesthesia Type: General Level of consciousness: awake and alert, oriented and patient cooperative Pain management: pain level controlled Vital Signs Assessment: post-procedure vital signs reviewed and stable Respiratory status: spontaneous breathing, nonlabored ventilation and respiratory function stable Cardiovascular status: blood pressure returned to baseline and stable Postop Assessment: adequate PO intake Anesthetic complications: no   There were no known notable events for this encounter.   Last Vitals:  Vitals:   03/23/22 1525 03/23/22 1530  BP:    Pulse: 85 84  Resp: (!) 6 11  Temp:  36.7 C  SpO2: 97% 97%    Last Pain:  Vitals:   03/23/22 1515  TempSrc:   PainSc: 0-No pain                 Darrin Nipper

## 2022-03-23 NOTE — Progress Notes (Signed)
Reported irritated left eye to Dr. Barbra Sarks.  Toradol eye drops ordered. Floor nurse, Ale' notified at 1605.

## 2022-03-23 NOTE — Anesthesia Procedure Notes (Addendum)
Procedure Name: Intubation Date/Time: 03/23/2022 7:47 AM  Performed by: Darrin Nipper, MDPre-anesthesia Checklist: Patient identified, Emergency Drugs available, Suction available and Patient being monitored Patient Re-evaluated:Patient Re-evaluated prior to induction Oxygen Delivery Method: Circle system utilized Preoxygenation: Pre-oxygenation with 100% oxygen Induction Type: IV induction Ventilation: Mask ventilation without difficulty Laryngoscope Size: McGraph and 3 Grade View: Grade I Tube type: Oral Tube size: 7.0 mm Number of attempts: 1 Airway Equipment and Method: Stylet Placement Confirmation: ETT inserted through vocal cords under direct vision, positive ETCO2 and breath sounds checked- equal and bilateral Secured at: 21 cm Tube secured with: Tape Dental Injury: Teeth and Oropharynx as per pre-operative assessment

## 2022-03-23 NOTE — Op Note (Addendum)
Procedure Date:  03/23/2022  Pre-operative Diagnosis:  End colostomy in place  Post-operative Diagnosis: End colostomy in place  Procedure:   Laparoscopic converted to open colostomy reversal with stapled end-to-end colorectal anastomosis Splenic flexure mobilization Extensive lysis of adhesions of all 4 quadrants, taking 3 hrs. Primary repair of small bowel enterotomy Open appendectomy Partial omentectomy  Surgeon:  Melvyn Neth, MD  Assistant:  Edison Simon, PA-C.  His assistance was critical due to the complex nature of the surgery.  He assisted throughout all the critical components of the entire surgery.  Anesthesia:  General endotracheal  Estimated Blood Loss:  50 ml  Specimens: End colostomy Appendectomy Anastomotic donuts Partial omentum  Complications:  Enterotomy of small bowel, requiring primary repair.  Indications for Procedure:  This is a 77 y.o. female with a history of prior Hartmann's procedure for large bowel obstruction secondary to a sigmoid colon stricture, now presenting now for elective colostomy reversal.  Pre-operative colonoscopy did not reveal any other issues that would preclude surgery. The risks of bleeding, infection, bowel injury, need for diverting ostomy, open surgery, and need for further procedures were all discussed with the patient and she was willing to proceed.   Description of Procedure: The patient was correctly identified in the preoperative area and brought into the operating room.  The patient was placed supine with VTE prophylaxis in place.  Appropriate time-outs were performed.  Anesthesia was induced and the patient was intubated.  Appropriate antibiotics were infused.  The patient was then placed in lithotomy position.   Dr. Bernardo Heater then proceeded with a cystoscopy and injection of ICG into bilateral ureters.  Please see Dr. Dene Gentry op note for further details.  The patient was then placed in low lithotomy position.  The  end colostomy was sutured close using 2-0 Silk suture.  The abdomen was prepped and draped in a sterile fashion.  Our planned port sites were marked.  We started by incising at the mucocutaneous junction with the end colostomy, dissecting the subcutaneous tissue around the stoma and mobilizing the full ostomy from the abdominal wall and subcutaneous space.  There was a small serosal tear about 3 cm proximal to the end colostomy.  The patient had thick adhesions between the colon and the subcutaneous tissue.  Eventually the anterior rectus sheath, rectus muscle, and posterior sheath were mobilized off the colon.  There adhesions of the omentum towards the colon noted and freed using cautery.  There were also some adhesions from the small bowel to the abdominal wall and colon which were taken down bluntly and with cautery.  Once the colon was fully freed, an wound protector was inserted.  A window in the colon mesentery was created at the site of the serosal tear, and the mesentery was divided off the colon wall circumferentially.  A purse string clamp was used to clamp the colon at this point and scalpel used to transect the end colostomy.  This was sent to pathology.  The wall was grasped with Alis clamps x 3 and the colon lumen serially dilated.  It was decided that a 29 mm EEA stapler would fit well.  The anvil was inserted and a 2-0 Prolene was used to created a purse string suture.  Then the anvil and colon was pushed into the peritoneal cavity.  The wound protector was twisted around an 8 mm robotic port and the abdominal cavity was insufflated.  The camera was inserted and upon inspection, the entire abdominal wall had adhesions  from mostly small bowel and omentum to the abdominal wall, without any space for port placement as initially planned.  It was then decided to convert to open surgery.    The patient's prior midline incision was re-incised and cautery was used to dissect down the subcutaneous tissue.   Pneumoperitoneum was maintained and with the use of the camera, a sport in the midline fascia was incised and we got access to the abdomen.  Then cautery was used to extend the fascial incision superiorly and inferiorly.  The wound protector, 8 mm port, and camera were all removed.  It was noted that all 4 quadrants had extensive adhesions.  We the started to meticulously take down the adhesions, using cautery/Ligasure, sharp dissection with Metzenbaum scissors, and blunt dissection.  A 2 cm enterotomy was created in the mid small bowel in order to free the bowel off the abdominal wall.  There was some small amount of bilious material that spilled, but it was initially controlled with two interrupted 3-0 Silk sutures.  After tedious dissection that took approximately 3 hours, the small bowel was fully mobilized and freed without further injury.  The rectal stump was freed proximally as well revealing the prior Prolene sutures used to mark the stump.  Given the significant adhesions, it was also decided to do an open appendectomy.  A window was created in the mesoappendix at its base and a GIA 55 mm blue load stapler was used to transect the appendix.  LigaSure was used to take the mesoappendix, and the appendix was sent off to pathology.  We then proceeded to mobilized the descending colon by dissecting along the [facility] proximally towards the splenic flexure.  The splenocolic attachments were taken down using LigaSure.  After this, the colon was noted to have good reach to the rectal stump without any significant tension.  Serial dilators were introduced into the rectal stump followed by the 29 EEA stapler.  The spike was deployed in the middle of the prior rectal staple line and the anvil and spike were connected.  The stapler was closed and fired, keeping the colon well aligned and without any gaps in the anastomosis.  The stapler was removed and two intact anastomotic donuts were observed and sent to  pathology.  A red rubber catheter was inserted into the rectum and after filling the pelvis with saline, air insufflation across the anastomosis did not reveal any leaks.  At this point the abdominal cavity was thoroughly irrigated with warm saline.  The anesthesia team attempted NG placement but could not get the NG to go straight.  A section of the omentum appeared ischemic and this was transected using the LigaSure and sent to pathology. 1 gm of Arista powder was also sprayed in the pelvis to continue helping with hemostasis.    We then proceeded with our clean closure after changing gloves and gown.  80 ml of Exparel solution was infiltrated onto the peritoneum, fascia, subcutaneous tissue and skin and over the rectus sheath of the ostomy site.  The midline fascia was closed with #1 PDS sutures x 2.  The subcutaneous tissue was approximated using 2-0 and 3-0 Vicryl over a 1/4 inch penrose drain.  The ostomy site posterior sheath was closed with running 2-0 Vicryl and the anterior sheath with running #1 PDS.  The deep dermis was approximated using 3-0 Vicryl over another 1/4 inch penrose drain, and the skin of all incisions was closed with stapler.  The wounds  were cleaned and dressed with Honeycomb dressings.  The patient was placed in flat supine position, emerged from anesthesia and extubated and brought to the recovery room for further management.   The patient tolerated the procedure well and all counts were correct at the end of the case.    Melvyn Neth, MD

## 2022-03-23 NOTE — Transfer of Care (Signed)
Immediate Anesthesia Transfer of Care Note  Patient: Katelyn Simmons  Procedure(s) Performed: XI ROBOTIC ASSISTED COLOSTOMY TAKEDOWN CONVERTED TO OPEN, REPAIR OF ENTEROTOMY, MOBILIZATION OF SPLENIC FLEXURE, LYSIS OF ADHESIONS APPENDECTOMY INDOCYANINE GREEN FLUORESCENCE IMAGING (ICG) (Bilateral)  Patient Location: PACU  Anesthesia Type:General  Level of Consciousness: awake and drowsy  Airway & Oxygen Therapy: Patient Spontanous Breathing and Patient connected to face mask oxygen  Post-op Assessment: Report given to RN and Post -op Vital signs reviewed and stable  Post vital signs: stable  Last Vitals:  Vitals Value Taken Time  BP 133/56 03/23/22 1430  Temp    Pulse 92 03/23/22 1434  Resp 10 03/23/22 1434  SpO2 97 % 03/23/22 1434  Vitals shown include unvalidated device data.  Last Pain:  Vitals:   03/23/22 0630  TempSrc: Temporal  PainSc: 0-No pain         Complications: There were no known notable events for this encounter.

## 2022-03-23 NOTE — Progress Notes (Signed)
Urology Preop  77 y.o. female scheduled for robotic assisted colostomy takedown by Dr. Hampton Abbot.  Instillation of intraureteral indocyanine green has been requested to aid in intraoperative ureteral localization.  The procedure was discussed in detail.  Potential postop side effects were reviewed including dysuria, hematuria.  All questions were answered.

## 2022-03-24 LAB — GLUCOSE, CAPILLARY
Glucose-Capillary: 125 mg/dL — ABNORMAL HIGH (ref 70–99)
Glucose-Capillary: 107 mg/dL — ABNORMAL HIGH (ref 70–99)
Glucose-Capillary: 115 mg/dL — ABNORMAL HIGH (ref 70–99)
Glucose-Capillary: 100 mg/dL — ABNORMAL HIGH (ref 70–99)
Glucose-Capillary: 160 mg/dL — ABNORMAL HIGH (ref 70–99)

## 2022-03-24 LAB — BASIC METABOLIC PANEL
Anion gap: 4 — ABNORMAL LOW (ref 5–15)
BUN: 12 mg/dL (ref 8–23)
CO2: 23 mmol/L (ref 22–32)
Calcium: 8.1 mg/dL — ABNORMAL LOW (ref 8.9–10.3)
Chloride: 111 mmol/L (ref 98–111)
Creatinine, Ser: 0.96 mg/dL (ref 0.44–1.00)
GFR, Estimated: >60 mL/min (ref >60)
Glucose, Bld: 125 mg/dL — ABNORMAL HIGH (ref 70–99)
Potassium: 3.6 mmol/L (ref 3.5–5.1)
Sodium: 138 mmol/L (ref 135–145)

## 2022-03-24 LAB — CBC
HCT: 31.3 % — ABNORMAL LOW (ref 36.0–46.0)
Hemoglobin: 10.1 g/dL — ABNORMAL LOW (ref 12.0–15.0)
MCH: 28 pg (ref 26.0–34.0)
MCHC: 32.3 g/dL (ref 30.0–36.0)
MCV: 86.7 fL (ref 80.0–100.0)
Platelets: 185 10*3/uL (ref 150–400)
RBC: 3.61 MIL/uL — ABNORMAL LOW (ref 3.87–5.11)
RDW: 17.7 % — ABNORMAL HIGH (ref 11.5–15.5)
WBC: 12.8 10*3/uL — ABNORMAL HIGH (ref 4.0–10.5)
nRBC: 0 % (ref 0.0–0.2)

## 2022-03-24 LAB — HEMOGLOBIN A1C
Hgb A1c MFr Bld: 5.7 % — ABNORMAL HIGH (ref 4.8–5.6)
Mean Plasma Glucose: 116.89 mg/dL

## 2022-03-24 LAB — MAGNESIUM: Magnesium: 1.9 mg/dL (ref 1.7–2.4)

## 2022-03-24 MED ORDER — POTASSIUM CHLORIDE IN NACL 20-0.9 MEQ/L-% IV SOLN
INTRAVENOUS | Status: DC
  Filled 2022-03-24 (×7): qty 1000

## 2022-03-24 NOTE — Progress Notes (Signed)
[facility] Day(s): 2.   Post op day(s): 1 Day Post-Op.   Interval History:  Patient seen and examined No acute events or new complaints overnight.  Patient reports she is doing okay this morning Abdominal soreness expectedly; worse with turning Some nausea immediately post-op but resolved this AM No fever, chills, emesis  Leukocytosis post-op improving; down to 12.8K Hgb to 10.1 Renal function normal; sCr - 0.96; UO - 420 ccs No electrolyte derangements She is NPO  No flatus but "feels rumbling"   Vital signs in last 24 hours: [min-max] current  Temp:  [98 F (36.7 C)-99.4 F (37.4 C)] 98.7 F (37.1 C) (02/21 0757) Pulse Rate:  [75-113] 75 (02/21 0757) Resp:  [6-22] 16 (02/21 0757) BP: (112-164)/(43-96) 115/47 (02/21 0757) SpO2:  [89 %-99 %] 98 % (02/21 0757)     Height: 5' 2"$  (157.5 cm) Weight: 84.7 kg BMI (Calculated): 34.14   Intake/Output last 2 shifts:  02/20 0701 - 02/21 0700 In: 3300 [I.V.:2950; IV Piggyback:350] Out: 620 [Urine:420; Emesis/NG output:100; Blood:100]   Physical Exam:  Constitutional: alert, cooperative and no distress  Respiratory: breathing non-labored at rest  Cardiovascular: regular rate and sinus rhythm  Gastrointestinal: Soft, incisional soreness expectedly, non-distended, no rebound/guarding.  Genitourinary: Foley catheter in place  Integumentary: Laparotomy is intact with staples, there is saturation of honeycomb inferiorly, penrose in place, no erythema. Previous colostomy site is CDI with staples, penrose in place, no drainage  Labs:     Latest Ref Rng & Units 03/24/2022    6:02 AM 03/23/2022    4:52 PM 03/23/2022    2:25 AM  CBC  WBC 4.0 - 10.5 K/uL 12.8  16.5  6.7   Hemoglobin 12.0 - 15.0 g/dL 10.1  12.9  11.5   Hematocrit 36.0 - 46.0 % 31.3  40.0  35.5   Platelets 150 - 400 K/uL 185  226  156       Latest Ref Rng & Units 03/24/2022    6:02 AM 03/23/2022    4:52 PM 03/23/2022     2:25 AM  CMP  Glucose 70 - 99 mg/dL 125  213  115   BUN 8 - 23 mg/dL 12  10  9   $ Creatinine 0.44 - 1.00 mg/dL 0.96  1.01  0.78   Sodium 135 - 145 mmol/L 138  138  137   Potassium 3.5 - 5.1 mmol/L 3.6  3.8  3.0   Chloride 98 - 111 mmol/L 111  108  106   CO2 22 - 32 mmol/L 23  19  23   $ Calcium 8.9 - 10.3 mg/dL 8.1  8.5  9.2   Total Protein 6.5 - 8.1 g/dL  6.7    Total Bilirubin 0.3 - 1.2 mg/dL  0.7    Alkaline Phos 38 - 126 U/L  85    AST 15 - 41 U/L  24    ALT 0 - 44 U/L  18      Imaging studies: No new pertinent imaging studies   Assessment/Plan: 77 y.o. female 1 Day Post-Op s/p colostomy takedown, repair of enterotomy, appendectomy, and extensive lysis of adhesions.    - Okay for sips of water, ice chips. Awaiting ROBF before advancement of diet. High risk for ileus given extensive nature of surgery - Continue IVF support - Complete perioperative Abx - Discontinue foley catheter - Monitor abdominal examination; on-going bowel function - Wound Care: Dress incisions daily with dry gauze and secure. Okay  to change as needed. She will continue penrose drains and staples.   - Pain control prn; antiemetics prn   - Monitor leukocytosis; likely reactive; improving - Mobilize; engage PT    All of the above findings and recommendations were discussed with the patient, patient's family, and the medical team, and all of their questions were answered to their expressed satisfaction.  -- Edison Simon, PA-C [facility] 03/24/2022, 8:58 AM M-F: 7am - 4pm

## 2022-03-24 NOTE — Evaluation (Addendum)
Physical Therapy Evaluation Patient Details Name: Katelyn Simmons MRN: [id number] DOB: 09-Jul-1945 Today's Date: 03/24/2022  History of Present Illness  77 y.o. female s/p colostomy takedown (placed ~Oct '23), repair of enterotomy, appendectomy, and extensive lysis of adhesions 2/20.  Clinical Impression  Pt initially showed some hesitation to do a lot secondary to surgical pain, but once assisted to sitting EOB she showed good motivation and was ultimately participated well.  She did need assist with sidelying to sit transition 2/2 abdominal pain/weakness.  Reports she had some dizziness when up to commode earlier, no dizziness with PT; BP was 119/50 in sitting pre ambulation.  She ultimately was able to safely ambulate ~400 ft with light RW use and consistent and confident cadence.  Pt will be safe to d/c home with the assist available when medically cleared for d/c.      Recommendations for follow up therapy are one component of a multi-disciplinary discharge planning process, led by the attending physician.  Recommendations may be updated based on patient status, additional functional criteria and insurance authorization.  Follow Up Recommendations No PT follow up (will maintain on caseload while admitted to insure safe transition home)      Assistance Recommended at Discharge Intermittent Supervision/Assistance  Patient can return home with the following  Assistance with cooking/housework;Assist for transportation    Equipment Recommendations None recommended by PT  Recommendations for Other Services       Functional Status Assessment Patient has had a recent decline in their functional status and demonstrates the ability to make significant improvements in function in a reasonable and predictable amount of time.     Precautions / Restrictions Precautions Precautions: Fall Restrictions Weight Bearing Restrictions: No      Mobility  Bed Mobility Overal bed mobility: Needs  Assistance Bed Mobility: Rolling, Sidelying to Sit Rolling: Min guard Sidelying to sit: Mod assist       General bed mobility comments: Pt was able to roll to the side w/o direct assist, light use of rails.  Showed good effort in trying to use rails to get her trunk to elevate but ultimately needed considerable assist due to abdominal pain and weakness    Transfers Overall transfer level: Modified independent Equipment used: Rolling walker (2 wheels)               General transfer comment: Minimal cuing for set up/hand placement, slow with heavy forward lean but no direct assist to get to standing in RW    Ambulation/Gait Ambulation/Gait assistance: Supervision Gait Distance (Feet): 400 Feet Assistive device: Rolling walker (2 wheels)         General Gait Details: Pt with consistent and confident cadence, appropriate UE use on RW, stable vitals and good overall confidence with her first prolonged ambulation effort post op.  Stairs            Wheelchair Mobility    Modified Rankin (Stroke Patients Only)       Balance Overall balance assessment: Mild deficits observed, not formally tested                                           Pertinent Vitals/Pain Pain Assessment Pain Assessment: 0-10 Pain Score: 7  Pain Location: abdominal incision "the staples"    Home Living Family/patient expects to be discharged to:: Private residence Living Arrangements: Alone Available Help at Discharge: Family;Available 24  hours/day (son is going to stay with her for at least a few days) Type of Home: House Home Access: Ramped entrance       Home Layout: One level Home Equipment: Agricultural consultant (2 wheels);Cane - single point      Prior Function Prior Level of Function : Independent/Modified Independent             Mobility Comments: was previously using RW PTA and occasional use of SPC ADLs Comments: indep     Hand Dominance   Dominant  Hand: Right    Extremity/Trunk Assessment   Upper Extremity Assessment Upper Extremity Assessment: Generalized weakness;Overall [facility] for tasks assessed    Lower Extremity Assessment Lower Extremity Assessment: Overall WFL for tasks assessed;Generalized weakness       Communication   Communication: No difficulties  Cognition Arousal/Alertness: Awake/alert Behavior During Therapy: WFL for tasks assessed/performed Overall Cognitive Status: Within Functional Limits for tasks assessed                                          General Comments General comments (skin integrity, edema, etc.): Pt reports that normally she is very active, still not at [place] but feeling that she can manage at home safely with the assist she'll have.    Exercises     Assessment/Plan    PT Assessment Patient needs continued PT services  PT Problem List Decreased strength;Decreased activity tolerance;Decreased mobility;Pain;Decreased safety awareness       PT Treatment Interventions DME instruction;Gait training;Functional mobility training;Therapeutic activities;Therapeutic exercise;Balance training;Patient/family education    PT Goals (Current goals can be found in the Care Plan section)  Acute Rehab PT Goals Patient Stated Goal: go home PT Goal Formulation: With patient Time For Goal Achievement: 04/07/22 Potential to Achieve Goals: Good    Frequency Min 2X/week     Co-evaluation               AM-PAC PT "6 Clicks" Mobility  Outcome Measure Help needed turning from your back to your side while in a flat bed without using bedrails?: A Little Help needed moving from lying on your back to sitting on the side of a flat bed without using bedrails?: A Lot Help needed moving to and from a bed to a chair (including a wheelchair)?: None Help needed standing up from a chair using your arms (e.g., wheelchair or bedside chair)?: None Help needed to walk in hospital room?:  None Help needed climbing 3-5 steps with a railing? : A Little 6 Click Score: 20    End of Session Equipment Utilized During Treatment: Gait belt Activity Tolerance: Patient tolerated treatment well Patient left: with chair alarm set;with call bell/phone within reach;with nursing/sitter in room Nurse Communication: Mobility status PT Visit Diagnosis: Muscle weakness (generalized) (M62.81);Pain Pain - part of body:  (abdomen)    Time: 9147-8295 PT Time Calculation (min) (ACUTE ONLY): 23 min   Charges:   PT Evaluation $PT Eval Low Complexity: 1 Low          Malachi Pro, DPT 03/24/2022, 3:22 PM

## 2022-03-24 NOTE — TOC Initial Note (Signed)
Transition of Care [facility]) - Initial/Assessment Note    Patient Details  Name: Katelyn Simmons MRN: [id number] Date of Birth: 08-Jul-1945  Transition of Care [facility]) CM/SW Contact:    Laurena Slimmer, RN Phone Number: 03/24/2022, 11:36 PM  Clinical Narrative:                  Transition of Care (TOC) Screening Note   Patient Details  Name: Katelyn Simmons Date of Birth: 10-Dec-1945   Transition of Care [facility]) CM/SW Contact:    Laurena Slimmer, RN Phone Number: 03/24/2022, 11:36 PM    Transition of Care Department [facility]) has reviewed patient and no TOC needs have been identified at this time. We will continue to monitor patient advancement through interdisciplinary progression rounds. If new patient transition needs arise, please place a TOC consult.          Patient Goals and CMS Choice            Expected Discharge Plan and Services                                              Prior Living Arrangements/Services                       Activities of Daily Living Home Assistive Devices/Equipment: None ADL Screening (condition at time of admission) Patient's cognitive ability adequate to safely complete daily activities?: Yes Is the patient deaf or have difficulty hearing?: No Does the patient have difficulty seeing, even when wearing glasses/contacts?: No Does the patient have difficulty concentrating, remembering, or making decisions?: No Patient able to express need for assistance with ADLs?: Yes Does the patient have difficulty dressing or bathing?: No Independently performs ADLs?: Yes (appropriate for developmental age) Does the patient have difficulty walking or climbing stairs?: No Weakness of Legs: None Weakness of Arms/Hands: None  Permission Sought/Granted                  Emotional Assessment              Admission diagnosis:  Colostomy in place [facility]) [Z93.3] Patient Active Problem List   Diagnosis Date Noted   Colostomy in  place ([place]) 03/22/2022   Barrett's esophagus without dysplasia 01/13/2022   Polyp of ascending colon 01/13/2022   Leg pain 12/31/2021   Atherosclerosis of native arteries of extremity with intermittent claudication ([place]) 123456   Non-alcoholic fatty liver disease 12/16/2021   Diverticula of intestine 12/16/2021   Joint pain 12/16/2021   Morbid obesity ([place]) 12/16/2021   Benign hypertension 12/16/2021   Hiatal hernia with gastroesophageal reflux 12/16/2021   Colon stricture ([place]) 12/03/2021   Hartmann's pouch of intestine ([place]) 12/03/2021   Iron deficiency anemia 11/26/2021   Obesity (BMI 30-39.9) 11/25/2021   Hypophosphatemia 11/23/2021   Anxiety and depression 11/19/2021   Controlled type 2 diabetes mellitus without complication, without long-term current use of insulin ([place]) 11/19/2021   Hypomagnesemia 11/19/2021   Hyponatremia 11/19/2021   Large bowel obstruction ([place]) 11/18/2021   Rheumatoid arthritis of multiple sites with negative rheumatoid factor ([place]) 11/05/2021   Type 2 diabetes mellitus with peripheral neuropathy ([place]) 06/18/2021   Dyslipidemia 06/18/2021   Hypothyroidism 06/18/2021   GERD without esophagitis 06/18/2021   Depression 06/18/2021   Hypokalemia 06/18/2021   Overweight (BMI 25.0-29.9) 06/18/2021   Transient global amnesia 06/17/2021  Hypercholesterolemia 07/08/2020   Diabetes mellitus ([place]) 07/08/2020   Bilateral leg weakness 10/27/2019   Aortic atherosclerosis ([place]) 10/25/2019   Ataxia 09/20/2019   Weakness generalized 09/20/2019   S/P lumbar laminectomy 10/25/2018   Postablative hypothyroidism 12/17/2017   Chronic insomnia 12/15/2017   Lumbar radiculopathy 02/24/2017   Postmenopausal 07/28/2016   Abnormal barium swallow 03/18/2016   Fatty infiltration of liver 12/10/2015   Abnormal finding on EKG 05/22/2015   CKD (chronic kidney disease) stage 3, GFR 30-59 ml/min (HCC) 05/22/2015   Enlarged lymph node in neck 05/22/2015   Seasonal allergic  rhinitis due to pollen 05/22/2015   Essential hypertension 02/20/2015   Complete rupture of rotator cuff 12/16/2014   Rotator cuff tear arthropathy of both shoulders 12/16/2014   B12 deficiency 08/07/2013   Kidney stones 05/18/2013   Pernicious anemia 05/18/2013   Osteoarthritis 04/19/2013   PCP:  Idelle Crouch, MD Pharmacy:   [facility] [place], [place] [address] [place] [address] Phone: [phone] Fax: [phone]  [id number] [place] [address] [id number]: [phone]: [phone]    Social Determinants of Health (SDOH) Social History: SDOH Screenings   Food Insecurity: No Food Insecurity (03/22/2022)  Housing: Low Risk  (03/22/2022)  Transportation Needs: No Transportation Needs (03/22/2022)  Utilities: Not At Risk (03/22/2022)  Depression (PHQ2-9): Low Risk  (07/08/2020)  Financial Resource Strain: Medium Risk (10/27/2018)  Tobacco Use: Low Risk  (03/23/2022)   SDOH Interventions:     Readmission Risk Interventions    11/24/2021   11:07 AM  Readmission Risk Prevention Plan  Transportation Screening Complete  Medication Review (RN Care Manager) Complete  [place]RI or [place]  SW Recovery Care/Counseling Consult Complete  Palliative Care Screening Not [place] Applicable

## 2022-03-25 ENCOUNTER — Encounter: Payer: Self-pay | Admitting: Surgery

## 2022-03-25 LAB — BASIC METABOLIC PANEL
Anion gap: 3 — ABNORMAL LOW (ref 5–15)
BUN: 16 mg/dL (ref 8–23)
CO2: 21 mmol/L — ABNORMAL LOW (ref 22–32)
Calcium: 8.1 mg/dL — ABNORMAL LOW (ref 8.9–10.3)
Chloride: 114 mmol/L — ABNORMAL HIGH (ref 98–111)
Creatinine, Ser: 1.34 mg/dL — ABNORMAL HIGH (ref 0.44–1.00)
GFR, Estimated: 41 mL/min — ABNORMAL LOW (ref >60)
Glucose, Bld: 97 mg/dL (ref 70–99)
Potassium: 3.6 mmol/L (ref 3.5–5.1)
Sodium: 138 mmol/L (ref 135–145)

## 2022-03-25 LAB — GLUCOSE, CAPILLARY
Glucose-Capillary: 104 mg/dL — ABNORMAL HIGH (ref 70–99)
Glucose-Capillary: 111 mg/dL — ABNORMAL HIGH (ref 70–99)
Glucose-Capillary: 114 mg/dL — ABNORMAL HIGH (ref 70–99)
Glucose-Capillary: 125 mg/dL — ABNORMAL HIGH (ref 70–99)
Glucose-Capillary: 76 mg/dL (ref 70–99)
Glucose-Capillary: 80 mg/dL (ref 70–99)
Glucose-Capillary: 89 mg/dL (ref 70–99)
Glucose-Capillary: 96 mg/dL (ref 70–99)

## 2022-03-25 LAB — CBC
HCT: 27 % — ABNORMAL LOW (ref 36.0–46.0)
Hemoglobin: 8.8 g/dL — ABNORMAL LOW (ref 12.0–15.0)
MCH: 28.2 pg (ref 26.0–34.0)
MCHC: 32.6 g/dL (ref 30.0–36.0)
MCV: 86.5 fL (ref 80.0–100.0)
Platelets: 226 10*3/uL (ref 150–400)
RBC: 3.12 MIL/uL — ABNORMAL LOW (ref 3.87–5.11)
RDW: 17.7 % — ABNORMAL HIGH (ref 11.5–15.5)
WBC: 10.8 10*3/uL — ABNORMAL HIGH (ref 4.0–10.5)
nRBC: 0 % (ref 0.0–0.2)

## 2022-03-25 LAB — SURGICAL PATHOLOGY

## 2022-03-25 LAB — MAGNESIUM: Magnesium: 1.7 mg/dL (ref 1.7–2.4)

## 2022-03-25 LAB — MRSA NEXT GEN BY PCR, NASAL: MRSA by PCR Next Gen: NOT DETECTED

## 2022-03-25 MED ORDER — SODIUM CHLORIDE 0.9 % IV BOLUS
500.0000 mL | Freq: Once | INTRAVENOUS | Status: AC
  Administered 2022-03-25: 500 mL via INTRAVENOUS

## 2022-03-25 NOTE — Progress Notes (Signed)
Physical Therapy Treatment Patient Details Name: Katelyn Simmons MRN: [id number] DOB: [birth year] Today's Date: 03/25/2022   History of Present Illness 77 y.o. female s/p colostomy takedown (placed ~Oct '23), repair of enterotomy, appendectomy, and extensive lysis of adhesions 2/20.    PT Comments    Pt very pleasant and eager to work with PT, reports she had some some shorter walks earlier today.  She was able to ambulate ~600 ft with RW and consistent cadence with stable vitals and no increased pain or DOE.  Pt continues to have expected incisional abdominal pain but no safety or balance issues.  Discussed activity progression, AD use and course of recovery. Will maintain on PT caseload to insure mobility and safe transition home.    Recommendations for follow up therapy are one component of a multi-disciplinary discharge planning process, led by the attending physician.  Recommendations may be updated based on patient status, additional functional criteria and insurance authorization.  Follow Up Recommendations  No PT follow up     Assistance Recommended at Discharge Intermittent Supervision/Assistance  Patient can return home with the following Assistance with cooking/housework;Assist for transportation   Equipment Recommendations  None recommended by PT    Recommendations for Other Services       Precautions / Restrictions Precautions Precautions: Fall Restrictions Weight Bearing Restrictions: No     Mobility  Bed Mobility               General bed mobility comments: in recliner pre/post session    Transfers Overall transfer level: Modified independent Equipment used: None               General transfer comment: able to rise w/o assist, appropraite UE use, able to maintain balance w/o AD    Ambulation/Gait Ambulation/Gait assistance: Modified independent (Device/Increase time) Gait Distance (Feet): 600 Feet Assistive device: Rolling walker (2 wheels)          General Gait Details: Pt with consistent and confident cadence, appropriate UE use on RW, stable vitals and good overall confidence.  She was able to ambulate ~10 ft w/o AD but reports that she does not feel her LEs are strong enough yet to safely try.  Discussd progression on recovery and decreased AD reliance.   Stairs             Wheelchair Mobility    Modified Rankin (Stroke Patients Only)       Balance Overall balance assessment: Mild deficits observed, not formally tested                                          Cognition Arousal/Alertness: Awake/alert Behavior During Therapy: WFL for tasks assessed/performed Overall Cognitive Status: Within Functional Limits for tasks assessed                                          Exercises      General Comments General comments (skin integrity, edema, etc.): Pt did very well with prolonged ambulation, no LOBs or safety issues.      Pertinent Vitals/Pain Pain Assessment Pain Score: 3  Pain Location: mild abdominal pain    Home Living  Prior Function            PT Goals (current goals can now be found in the care plan section) Progress towards PT goals: Progressing toward goals    Frequency    Min 2X/week      PT Plan Current plan remains appropriate    Co-evaluation              AM-PAC PT "6 Clicks" Mobility   Outcome Measure  Help needed turning from your back to your side while in a flat bed without using bedrails?: A Little Help needed moving from lying on your back to sitting on the side of a flat bed without using bedrails?: A Little Help needed moving to and from a bed to a chair (including a wheelchair)?: None Help needed standing up from a chair using your arms (e.g., wheelchair or bedside chair)?: None Help needed to walk in hospital room?: None Help needed climbing 3-5 steps with a railing? : None 6 Click  Score: 22    End of Session Equipment Utilized During Treatment: Gait belt Activity Tolerance: Patient tolerated treatment well Patient left: with chair alarm set;with call bell/phone within reach;with nursing/sitter in room Nurse Communication: Mobility status PT Visit Diagnosis: Muscle weakness (generalized) (M62.81);Pain Pain - part of body:  (abdminal)     Time: 1640-1650 PT Time Calculation (min) (ACUTE ONLY): 10 min  Charges:  $Gait Training: 8-22 mins                     Kreg Shropshire, DPT 03/25/2022, 5:50 PM

## 2022-03-25 NOTE — Progress Notes (Signed)
Mobility Specialist - Progress Note   03/25/22 1543  Mobility  Activity Ambulated independently in hallway  Level of Assistance Modified independent, requires aide device or extra time  Assistive Device Front wheel walker  Distance Ambulated (ft) 140 ft  Activity Response Tolerated well  $Mobility charge 1 Mobility   Candie Mile Mobility Specialist 03/25/22 3:48 PM

## 2022-03-25 NOTE — Progress Notes (Signed)
Mobility Specialist - Progress Note   03/25/22 1026  Mobility  Activity Ambulated independently to bathroom;Stood at bedside;Dangled on edge of bed  Level of Assistance Independent  Assistive Device None  Distance Ambulated (ft) 10 ft  Activity Response Tolerated well  Mobility Referral Yes  $Mobility charge 1 Mobility   Author responds to call light. Pt sitting in recliner on RA upon arrival. Pt STS and ambulates to/from bathroom indep. Pt returns to recliner with needs in reach and chair alarm set.   Gretchen Short  Mobility Specialist  03/25/22 10:27 AM

## 2022-03-25 NOTE — Progress Notes (Signed)
[place] Hospital Day(s): 3.   Post op day(s): 2 Days Post-Op.   Interval History:  Patient seen and examined No acute events or new complaints overnight.  Patient is doing well Expected abdominal soreness No fever, chills, nausea, or emesis  Leukocytosis post-op improving; down to 10.8K Hgb to 8.8; suspect this is dilutional; no evidence of bleeding She did bump sCr to 1.34 this AM; UO - 50 ccs + unmeasured  No electrolyte derangements She is on CLD; tolerating She is passing flatus and having bowel movements  Ambulated with therapies; no issue  Vital signs in last 24 hours: [min-max] current  Temp:  [97.8 F (36.6 C)-99.2 F (37.3 C)] 98.1 F (36.7 C) (02/22 0324) Pulse Rate:  [65-77] 71 (02/22 0324) Resp:  [18-20] 18 (02/22 0324) BP: (101-114)/(42-47) 105/42 (02/22 0324) SpO2:  [97 %-100 %] 97 % (02/22 0324)     Height: 5' 2"$  (157.5 cm) Weight: 84.7 kg BMI (Calculated): 34.14   Intake/Output last 2 shifts:  02/21 0701 - 02/22 0700 In: 2127.9 [P.O.:360; I.V.:1367.9; IV Piggyback:400] Out: 50 [Urine:50]   Physical Exam:  Constitutional: alert, cooperative and no distress  Respiratory: breathing non-labored at rest  Cardiovascular: regular rate and sinus rhythm  Gastrointestinal: Soft, incisional soreness expectedly, non-distended, no rebound/guarding.  Integumentary: Laparotomy is intact with staples, and penrose. Dressing recently changed.   Labs:     Latest Ref Rng & Units 03/25/2022    2:52 AM 03/24/2022    6:02 AM 03/23/2022    4:52 PM  CBC  WBC 4.0 - 10.5 K/uL 10.8  12.8  16.5   Hemoglobin 12.0 - 15.0 g/dL 8.8  10.1  12.9   Hematocrit 36.0 - 46.0 % 27.0  31.3  40.0   Platelets 150 - 400 K/uL 226  185  226       Latest Ref Rng & Units 03/25/2022    2:52 AM 03/24/2022    6:02 AM 03/23/2022    4:52 PM  CMP  Glucose 70 - 99 mg/dL 97  125  213   BUN 8 - 23 mg/dL 16  12  10   $ Creatinine 0.44 - 1.00 mg/dL 1.34  0.96   1.01   Sodium 135 - 145 mmol/L 138  138  138   Potassium 3.5 - 5.1 mmol/L 3.6  3.6  3.8   Chloride 98 - 111 mmol/L 114  111  108   CO2 22 - 32 mmol/L 21  23  19   $ Calcium 8.9 - 10.3 mg/dL 8.1  8.1  8.5   Total Protein 6.5 - 8.1 g/dL   6.7   Total Bilirubin 0.3 - 1.2 mg/dL   0.7   Alkaline Phos 38 - 126 U/L   85   AST 15 - 41 U/L   24   ALT 0 - 44 U/L   18     Imaging studies: No new pertinent imaging studies   Assessment/Plan: 77 y.o. female with AKI 2 Days Post-Op s/p colostomy takedown, repair of enterotomy, appendectomy, and extensive lysis of adhesions.    - Will advance to FLD + nutritional supplementation   - Discontinue Entereg - Continue IVF support; increase to 100 ml/hr + 500 cc Bolus  - Complete perioperative Abx - Monitor abdominal examination; on-going bowel function - Wound Care: Dress incisions daily with dry gauze and secure. Okay to change as needed. She will continue penrose drains and staples.   - Pain control prn; antiemetics prn   -  Monitor leukocytosis; likely reactive; improving  - Monitor H&H; suspect dilutional  - Monitor renal function; AKI this AM - Mobilize; worked with PT: no recommendations     - Discharge Planning; Making improvements, diet advancing. Monitor H&H and AKI. Potentially DC over the weekend.   All of the above findings and recommendations were discussed with the patient, patient's family, and the medical team, and all of their questions were answered to their expressed satisfaction.  -- Edison Simon, PA-C [facility] 03/25/2022, 8:14 AM M-F: 7am - 4pm

## 2022-03-25 NOTE — Care Management Important Message (Signed)
Important Message  Patient Details  Name: Katelyn Simmons MRN: [id number] Date of Birth: [birth year]   Medicare Important Message Given:  Yes     Dannette Barbara 03/25/2022, 1:01 PM

## 2022-03-26 LAB — GLUCOSE, CAPILLARY
Glucose-Capillary: 101 mg/dL — ABNORMAL HIGH (ref 70–99)
Glucose-Capillary: 103 mg/dL — ABNORMAL HIGH (ref 70–99)
Glucose-Capillary: 129 mg/dL — ABNORMAL HIGH (ref 70–99)
Glucose-Capillary: 134 mg/dL — ABNORMAL HIGH (ref 70–99)
Glucose-Capillary: 71 mg/dL (ref 70–99)
Glucose-Capillary: 94 mg/dL (ref 70–99)

## 2022-03-26 LAB — BASIC METABOLIC PANEL
Anion gap: 3 — ABNORMAL LOW (ref 5–15)
BUN: 12 mg/dL (ref 8–23)
CO2: 21 mmol/L — ABNORMAL LOW (ref 22–32)
Calcium: 8.1 mg/dL — ABNORMAL LOW (ref 8.9–10.3)
Chloride: 113 mmol/L — ABNORMAL HIGH (ref 98–111)
Creatinine, Ser: 0.95 mg/dL (ref 0.44–1.00)
GFR, Estimated: >60 mL/min (ref >60)
Glucose, Bld: 97 mg/dL (ref 70–99)
Potassium: 4 mmol/L (ref 3.5–5.1)
Sodium: 137 mmol/L (ref 135–145)

## 2022-03-26 LAB — CBC
HCT: 27 % — ABNORMAL LOW (ref 36.0–46.0)
Hemoglobin: 8.7 g/dL — ABNORMAL LOW (ref 12.0–15.0)
MCH: 27.8 pg (ref 26.0–34.0)
MCHC: 32.2 g/dL (ref 30.0–36.0)
MCV: 86.3 fL (ref 80.0–100.0)
Platelets: 198 10*3/uL (ref 150–400)
RBC: 3.13 MIL/uL — ABNORMAL LOW (ref 3.87–5.11)
RDW: 18 % — ABNORMAL HIGH (ref 11.5–15.5)
WBC: 8 10*3/uL (ref 4.0–10.5)
nRBC: 0 % (ref 0.0–0.2)

## 2022-03-26 MED ORDER — OXYCODONE HCL 5 MG PO TABS
5.0000 mg | ORAL_TABLET | ORAL | Status: DC | PRN
  Administered 2022-03-26: 5 mg via ORAL
  Filled 2022-03-26: qty 1

## 2022-03-26 MED ORDER — ACETAMINOPHEN 500 MG PO TABS: 1000.0000 mg | ORAL_TABLET | Freq: Four times a day (QID) | ORAL | Status: AC | PRN

## 2022-03-26 MED ORDER — IBUPROFEN 600 MG PO TABS: 600.0000 mg | ORAL_TABLET | Freq: Three times a day (TID) | ORAL | 1 refills | Status: AC | PRN

## 2022-03-26 MED ORDER — OXYCODONE HCL 5 MG PO TABS: 5.0000 mg | ORAL_TABLET | ORAL | 0 refills | Status: DC | PRN

## 2022-03-26 MED ORDER — PANTOPRAZOLE SODIUM 40 MG PO TBEC
40.0000 mg | DELAYED_RELEASE_TABLET | Freq: Every day | ORAL | Status: DC
  Administered 2022-03-26: 40 mg via ORAL
  Filled 2022-03-26: qty 1

## 2022-03-26 NOTE — Progress Notes (Addendum)
[place] Hospital Day(s): 4.   Post op day(s): 3 Days Post-Op.   Interval History:  Patient seen and examined Had an episode of increased drainage from wound last night; stopped; did not recur. No other issues overnight Patient is doing well this morning  Expected abdominal soreness; minimal; improving significantly No fever, chills, nausea, or emesis  WBC normalized; now 8.0K Hgb stable at to 8.7 Renal function normalized; sCr - 0.95; UO - 250 ccs + unmeasured No electrolyte derangements She is on soft diet; had not tried this yet however She is passing flatus and having bowel movements  Ambulated with therapies; no issue  Vital signs in last 24 hours: [min-max] current  Temp:  [98 F (36.7 C)-98.4 F (36.9 C)] 98 F (36.7 C) (02/23 0700) Pulse Rate:  [63-76] 66 (02/23 0700) Resp:  [15-18] 16 (02/23 0700) BP: (111-134)/(45-60) 134/60 (02/23 0700) SpO2:  [97 %-100 %] 100 % (02/23 0700)     Height: '5\' 2"'$  (157.5 cm) Weight: 84.7 kg BMI (Calculated): 34.14   Intake/Output last 2 shifts:  02/22 0701 - 02/23 0700 In: 720 [P.O.:720] Out: 251 [Urine:250; Stool:1]   Physical Exam:  Constitutional: alert, cooperative and no distress  Respiratory: breathing non-labored at rest  Cardiovascular: regular rate and sinus rhythm  Gastrointestinal: Soft, incisional soreness expectedly, non-distended, no rebound/guarding.  Integumentary: Laparotomy is intact with staples, and penrose. No drainage this AM> No erythema. Dressing replaced   Labs:     Latest Ref Rng & Units 03/26/2022    2:15 AM 03/25/2022    2:52 AM 03/24/2022    6:02 AM  CBC  WBC 4.0 - 10.5 K/uL 8.0  10.8  12.8   Hemoglobin 12.0 - 15.0 g/dL 8.7  8.8  10.1   Hematocrit 36.0 - 46.0 % 27.0  27.0  31.3   Platelets 150 - 400 K/uL 198  226  185       Latest Ref Rng & Units 03/26/2022    2:15 AM 03/25/2022    2:52 AM 03/24/2022    6:02 AM  CMP  Glucose 70 - 99 mg/dL 97  97  125    BUN 8 - 23 mg/dL '12  16  12   '$ Creatinine 0.44 - 1.00 mg/dL 0.95  1.34  0.96   Sodium 135 - 145 mmol/L 137  138  138   Potassium 3.5 - 5.1 mmol/L 4.0  3.6  3.6   Chloride 98 - 111 mmol/L 113  114  111   CO2 22 - 32 mmol/L '21  21  23   '$ Calcium 8.9 - 10.3 mg/dL 8.1  8.1  8.1     Imaging studies: No new pertinent imaging studies   Assessment/Plan: 77 y.o. female with AKI 3 Days Post-Op s/p colostomy takedown, repair of enterotomy, appendectomy, and extensive lysis of adhesions.    - Continue soft diet - Discontinue IVF - Monitor abdominal examination; on-going bowel function - Wound Care: Dress incisions daily with dry gauze and secure. Okay to change as needed. She will continue penrose drains and staples.   - Pain control prn; antiemetics prn   - Monitor leukocytosis; resolved  - Monitor H&H; stable  - Monitor renal function; normalized - Mobilize; worked with PT: no recommendations     - Discharge Planning; Doing well, will keep today and plan on DC home tomorrow (02/24). I will prep all discharge instructions and follow up today.   All of the above findings and recommendations  were discussed with the patient, patient's family, and the medical team, and all of their questions were answered to their expressed satisfaction.  -- Edison Simon, PA-C [facility] 03/26/2022, 9:13 AM M-F: 7am - 4pm

## 2022-03-26 NOTE — Progress Notes (Signed)
The patient is receiving Protonix by the intravenous route.  Based on criteria approved by the Pharmacy and [place], the medication is being converted to the equivalent oral dose form.  These criteria include: -No active GI bleeding -Able to tolerate diet of full liquids (or better) or tube feeding -Able to tolerate other medications by the oral or enteral route  If you have any questions about this conversion, please contact the Pharmacy Department. Thank you.

## 2022-03-26 NOTE — Progress Notes (Signed)
Physical Therapy Treatment Patient Details Name: Katelyn Simmons MRN: [id number] DOB: [birth year] Today's Date: 03/26/2022   History of Present Illness Pt is a 77 y.o. female s/p colostomy takedown (placed ~Oct '23), repair of enterotomy, appendectomy, and extensive lysis of adhesions 2/20.    PT Comments    Pt was pleasant and motivated to participate during the session and put forth good effort throughout. Pt demonstrated good control and stability with transfers and gait and demonstrated good stability during below balance training.  Pt was able to ascend and descend 4 steps with one rail and ambulate 250 feet with good control and confidence and with no adverse symptoms.  No PT services recommended at discharge but will keep pt on acute care caseload to prevent deconditioning while admitted.     Recommendations for follow up therapy are one component of a multi-disciplinary discharge planning process, led by the attending physician.  Recommendations may be updated based on patient status, additional functional criteria and insurance authorization.  Follow Up Recommendations  No PT follow up     Assistance Recommended at Discharge Intermittent Supervision/Assistance  Patient can return home with the following Assistance with cooking/housework;Assist for transportation;A little help with bathing/dressing/bathroom   Equipment Recommendations  None recommended by PT    Recommendations for Other Services       Precautions / Restrictions Precautions Precautions: Fall Restrictions Weight Bearing Restrictions: No     Mobility  Bed Mobility               General bed mobility comments: NT, in recliner pre/post session    Transfers Overall transfer level: Modified independent Equipment used: Rolling walker (2 wheels)               General transfer comment: Good eccentric and concentric control and stability with no cues needed for sequencing     Ambulation/Gait Ambulation/Gait assistance: Modified independent (Device/Increase time) Gait Distance (Feet): 250 Feet Assistive device: Rolling walker (2 wheels) Gait Pattern/deviations: Step-through pattern, Decreased step length - right, Decreased step length - left Gait velocity: decreased     General Gait Details: Slow cadence with reduced bilateral step length but steady without LOB   Stairs Stairs: Yes Stairs assistance: Supervision Stair Management: One rail Right, Forwards, Step to pattern Number of Stairs: 4 General stair comments: Good eccentric and concentric control and stability with BUEs on one rail   Wheelchair Mobility    Modified Rankin (Stroke Patients Only)       Balance Overall balance assessment: Needs assistance   Sitting balance-Leahy Scale: Normal     Standing balance support: No upper extremity supported Standing balance-Leahy Scale: Good                              Cognition Arousal/Alertness: Awake/alert Behavior During Therapy: WFL for tasks assessed/performed Overall Cognitive Status: Within Functional Limits for tasks assessed                                          Exercises Other Exercises Other Exercises: Static standing balance training with feet apart, together, and semi-tandem with combinations of eyes open/closed and head still/head turns Other Exercises: Dynamic standing balance training with feet apart, together, and semi-tandem with reaching outside BOS    General Comments        Pertinent Vitals/Pain Pain Assessment Pain  Assessment: No/denies pain    Home Living                          Prior Function            PT Goals (current goals can now be found in the care plan section) Progress towards PT goals: Progressing toward goals    Frequency    Min 2X/week      PT Plan Current plan remains appropriate    Co-evaluation              AM-PAC PT "6  Clicks" Mobility   Outcome Measure  Help needed turning from your back to your side while in a flat bed without using bedrails?: A Little Help needed moving from lying on your back to sitting on the side of a flat bed without using bedrails?: A Little Help needed moving to and from a bed to a chair (including a wheelchair)?: None Help needed standing up from a chair using your arms (e.g., wheelchair or bedside chair)?: None Help needed to walk in hospital room?: None Help needed climbing 3-5 steps with a railing? : None 6 Click Score: 22    End of Session Equipment Utilized During Treatment: Gait belt Activity Tolerance: Patient tolerated treatment well Patient left: with call bell/phone within reach;in chair Nurse Communication: Mobility status PT Visit Diagnosis: Muscle weakness (generalized) (M62.81);Pain     Time: [id number] PT Time Calculation (min) (ACUTE ONLY): 29 min  Charges:  $Gait Training: 8-22 mins $Therapeutic Exercise: 8-22 mins                     D. Scott Ofilia Rayon PT, DPT 03/26/22, 3:59 PM

## 2022-03-26 NOTE — Discharge Instructions (Signed)
In addition to included general post-operative instructions,  Diet: Resume home diet.   Activity: No heavy lifting >20 pounds (children, pets, laundry, garbage) or strenuous activity for 6 weeks, but light activity and walking are encouraged. Do not drive or drink alcohol if taking narcotic pain medications or having pain that might distract from driving.  Wound care: You may shower/get incision wet with soapy water and pat dry (do not rub incisions), but no baths or submerging incision underwater until follow-up. Cover incisions daily with dry gauze and secure with tape. There may drainage from this expectedly.   Medications: Resume all home medications. For mild to moderate pain: acetaminophen (Tylenol) or ibuprofen/naproxen (if no kidney disease). Combining Tylenol with alcohol can substantially increase your risk of causing liver disease. Narcotic pain medications, if prescribed, can be used for severe pain, though may cause nausea, constipation, and drowsiness. Do not combine Tylenol and Percocet (or similar) within a 6 hour period as Percocet (and similar) contain(s) Tylenol. If you do not need the narcotic pain medication, you do not need to fill the prescription.  Call office [phone] / [phone]) at any time if any questions, worsening pain, fevers/chills, bleeding, drainage from incision site, or other concerns.

## 2022-03-26 NOTE — Progress Notes (Signed)
Mobility Specialist - Progress Note   03/26/22 1508  Mobility  Activity Ambulated independently in hallway  Level of Assistance Modified independent, requires aide device or extra time  Assistive Device Front wheel walker  Distance Ambulated (ft) 200 ft  Activity Response Tolerated well  $Mobility charge 1 Mobility   Pt sitting in recliner upon entry, utilizing RA. Pt STS to RW and amb ModI; extra time for each step. Pt amb one lap around 2C NS, tolerated well. Pt denied any dizziness, SOB or pain throughout activity. Pt returned to the room, left sitting in recliner with needs within reach. Family member present at bedside.   Candie Mile Mobility Specialist 03/26/22 3:10 PM

## 2022-03-27 LAB — GLUCOSE, CAPILLARY
Glucose-Capillary: 114 mg/dL — ABNORMAL HIGH (ref 70–99)
Glucose-Capillary: 110 mg/dL — ABNORMAL HIGH (ref 70–99)
Glucose-Capillary: 164 mg/dL — ABNORMAL HIGH (ref 70–99)

## 2022-03-27 NOTE — Discharge Summary (Signed)
Patient ID: Katelyn Simmons MRN: [id number] DOB/AGE: May 19, 1945 77 y.o.  Admit date: 03/22/2022 Discharge date: 03/27/2022   Discharge Diagnoses:  Principal Problem:   Colostomy in place [facility])   Procedures: Hartman's reversal  Hospital Course:  77 year old female with history of sigmoid colectomy for a stricture underwent elective Hartman's reversal by Dr. Hampton Abbot.  Initially attempt a robotic approach converted to open due to significant adhesions.  Patient was kept  4 days after procedure.  Initially transient ileus and she responded slowly.  Started having bowel function and diet was advanced from a liquid diet to a regular diet.  She did well and started having bowel function.  She not have any surgical complications.  At The time of discharge the patient was ambulating,  pain was controlled.  Her vital signs were stable and she was afebrile.   physical exam at discharge showed a pt  in no acute distress.  Awake and alert.  Abdomen: Soft incisions healing well without infection or peritonitis.  Penrose in place.Extremities well-perfused and no edema.  Condition of the patient the time of discharge was stable   Disposition: Discharge disposition: 01-Home or Self Care       Discharge Instructions     Call MD for:  difficulty breathing, headache or visual disturbances   Complete by: As directed    Call MD for:  difficulty breathing, headache or visual disturbances   Complete by: As directed    Call MD for:  extreme fatigue   Complete by: As directed    Call MD for:  hives   Complete by: As directed    Call MD for:  persistant dizziness or light-headedness   Complete by: As directed    Call MD for:  persistant nausea and vomiting   Complete by: As directed    Call MD for:  persistant nausea and vomiting   Complete by: As directed    Call MD for:  redness, tenderness, or signs of infection (pain, swelling, redness, odor or green/yellow discharge around incision site)   Complete  by: As directed    Call MD for:  redness, tenderness, or signs of infection (pain, swelling, redness, odor or green/yellow discharge around incision site)   Complete by: As directed    Call MD for:  severe uncontrolled pain   Complete by: As directed    Call MD for:  severe uncontrolled pain   Complete by: As directed    Call MD for:  temperature >100.4   Complete by: As directed    Call MD for:  temperature >100.4   Complete by: As directed    Diet - low sodium heart healthy   Complete by: As directed    Diet general   Complete by: As directed    Soft diet as tolerated for the next week, and then can advance to her home diet.   Discharge instructions   Complete by: As directed    1.  Patient may shower, but do not scrub wounds heavily and dab dry only. 2.  Do not submerge wounds in pool/tub until fully healed. 3.  Do not remove staples or drains 4.  Dressing:  Dry gauze dressing over both wounds -- change once daily and as needed to keep the area clean and dry.   Discharge instructions   Complete by: As directed    Daily dressing changes   Driving Restrictions   Complete by: As directed    Do not drive while taking narcotics for  pain control.  Prior to driving, make sure you are able to rotate right and left to look at blindspots without significant pain or discomfort.   Increase activity slowly   Complete by: As directed    Increase activity slowly   Complete by: As directed    Lifting restrictions   Complete by: As directed    No heavy lifting or pushing of more than 10-15 lbs for 4 weeks.   Lifting restrictions   Complete by: As directed    20 lbs x 6 wks      Allergies as of 03/27/2022       Reactions   Sulfa Antibiotics Swelling   Swelling of face   Methocarbamol Other (See Comments)   Lips felt swollen, but were not   Parafon Forte Dsc [chlorzoxazone] Other (See Comments)   Unknown reaction. Patient does not remember taking this medication   Sulfur Dioxide  Other (See Comments)   Tizanidine Other (See Comments)   Codeine Other (See Comments)   Jittery        Medication List     TAKE these medications    acetaminophen 500 MG tablet Commonly known as: TYLENOL Take 2 tablets (1,000 mg total) by mouth every 6 (six) hours as needed for mild pain or fever. What changed: Another medication with the same name was added. Make sure you understand how and when to take each.   acetaminophen 500 MG tablet Commonly known as: TYLENOL Take 2 tablets (1,000 mg total) by mouth every 6 (six) hours as needed for mild pain. What changed: You were already taking a medication with the same name, and this prescription was added. Make sure you understand how and when to take each.   beta carotene w/minerals tablet Take 1 tablet by mouth daily.   busPIRone 10 MG tablet Commonly known as: BUSPAR Take 10 mg by mouth 2 (two) times daily.   Cholecalciferol 25 MCG (1000 UT) capsule Take 1,000 Units by mouth every evening.   cyanocobalamin 1000 MCG tablet Commonly known as: VITAMIN B12 Take 1,000 mcg by mouth daily.   B-12 COMPLIANCE INJECTION IJ Inject as directed every 30 (thirty) days.   fenofibrate 160 MG tablet Take 160 mg by mouth at bedtime.   folic acid 1 MG tablet Commonly known as: FOLVITE Take 1 mg by mouth daily.   glucose blood test strip 1 each by Other route as needed for other. Use as instructed   [employer] by Does not apply route.   ibuprofen 600 MG tablet Commonly known as: ADVIL Take 1 tablet (600 mg total) by mouth every 8 (eight) hours as needed for moderate pain.   levothyroxine 125 MCG tablet Commonly known as: SYNTHROID Take 125 mcg by mouth daily before breakfast.   liothyronine 5 MCG tablet Commonly known as: CYTOMEL Take 5 mcg by mouth daily.   LUTEIN 20 PO Take 20 mg by mouth daily.   magnesium oxide 400 (240 Mg) MG tablet Commonly known as: MAG-OX Take 400 mg by mouth daily.    methotrexate 2.5 MG tablet Commonly known as: RHEUMATREX Take 20 mg by mouth once a week.   metoprolol succinate 100 MG 24 hr tablet Commonly known as: TOPROL-XL Take 100 mg by mouth daily. Take with or immediately following a meal.   oxyCODONE 5 MG immediate release tablet Commonly known as: Oxy IR/ROXICODONE Take 1 tablet (5 mg total) by mouth every 4 (four) hours as needed for severe pain.  pantoprazole 40 MG tablet Commonly known as: PROTONIX Take 40 mg by mouth daily.   phentermine 37.5 MG tablet Commonly known as: ADIPEX-P Take 37.5 mg by mouth every morning.   pioglitazone 45 MG tablet Commonly known as: ACTOS Take 45 mg by mouth daily.   QUEtiapine 50 MG tablet Commonly known as: SEROQUEL Take 50 mg by mouth at bedtime.   rosuvastatin 10 MG tablet Commonly known as: CRESTOR Take 10 mg by mouth at bedtime.   venlafaxine XR 150 MG 24 hr capsule Commonly known as: EFFEXOR-XR Take 150 mg by mouth daily with breakfast.        Follow-up Information     Olean Ree, MD. Go on 04/05/2022.   Specialty: General Surgery Why: Go to appointment on 03/04 at [place] AM with Dr Wynona Canes information: [address] [place] [address] [phone]                  Caroleen Hamman, MD FACS

## 2022-04-05 ENCOUNTER — Encounter: Payer: Self-pay | Admitting: Surgery

## 2022-04-05 ENCOUNTER — Ambulatory Visit (INDEPENDENT_AMBULATORY_CARE_PROVIDER_SITE_OTHER): Payer: Medicare HMO | Admitting: Surgery

## 2022-04-05 VITALS — Ht 62.0 in | Wt 182.0 lb

## 2022-04-05 DIAGNOSIS — Z09 Encounter for follow-up examination after completed treatment for conditions other than malignant neoplasm: Secondary | ICD-10-CM

## 2022-04-05 DIAGNOSIS — Z9889 Other specified postprocedural states: Secondary | ICD-10-CM

## 2022-04-05 DIAGNOSIS — K660 Peritoneal adhesions (postprocedural) (postinfection): Secondary | ICD-10-CM

## 2022-04-05 NOTE — Patient Instructions (Addendum)
Continue to keep a dressing over the incision sites until they stop draining. You may shower, remove your dressing first. Let the warm soapy water run over the area, rinse well, pat dry, and redress the area. You may eat a high fiber diet. If you eat a food and it causes you pain, stop.   Follow up in one month.   GENERAL POST-OPERATIVE PATIENT INSTRUCTIONS   WOUND CARE INSTRUCTIONS:  Keep a dry clean dressing on the wound if there is drainage. The initial bandage may be removed after 24 hours.  Once the wound has quit draining you may leave it open to air.  If clothing rubs against the wound or causes irritation and the wound is not draining you may cover it with a dry dressing during the daytime.  Try to keep the wound dry and avoid ointments on the wound unless directed to do so.  If the wound becomes bright red and painful or starts to drain infected material that is not clear, please contact your physician immediately.  If the wound is mildly pink and has a thick firm ridge underneath it, this is normal, and is referred to as a healing ridge.  This will resolve over the next 4-6 weeks.  BATHING: You may shower if you have been informed of this by your surgeon. However, Please do not submerge in a tub, hot tub, or pool until incisions are completely sealed or have been told by your surgeon that you may do so.  DIET:  You may eat any foods that you can tolerate.  It is a good idea to eat a high fiber diet and take in plenty of fluids to prevent constipation.  If you do become constipated you may want to take a mild laxative or take ducolax tablets on a daily basis until your bowel habits are regular.  Constipation can be very uncomfortable, along with straining, after recent surgery.  ACTIVITY:  You are encouraged to cough and deep breath or use your incentive spirometer if you were given one, every 15-30 minutes when awake.  This will help prevent respiratory complications and low grade fevers  post-operatively if you had a general anesthetic.  You may want to hug a pillow when coughing and sneezing to add additional support to the surgical area, if you had abdominal or chest surgery, which will decrease pain during these times.  You are encouraged to walk and engage in light activity for the next two weeks.  You should not lift more than 20 pounds for 6 weeks total after surgery as it could put you at increased risk for complications.  Twenty pounds is roughly equivalent to a plastic bag of groceries. At that time- Listen to your body when lifting, if you have pain when lifting, stop and then try again in a few days. Soreness after doing exercises or activities of daily living is normal as you get back in to your normal routine.  MEDICATIONS:  Try to take narcotic medications and anti-inflammatory medications, such as tylenol, ibuprofen, naprosyn, etc., with food.  This will minimize stomach upset from the medication.  Should you develop nausea and vomiting from the pain medication, or develop a rash, please discontinue the medication and contact your physician.  You should not drive, make important decisions, or operate machinery when taking narcotic pain medication.  SUNBLOCK Use sun block to incision area over the next year if this area will be exposed to sun. This helps decrease scarring and will allow  you avoid a permanent darkened area over your incision.  QUESTIONS:  Please feel free to call our office if you have any questions, and we will be glad to assist you. [phone]

## 2022-04-05 NOTE — Progress Notes (Signed)
04/05/2022  HPI: PACIE SCHLACK is a 77 y.o. female s/p colostomy reversal on 03/23/22.  It had to be done as open procedure due to the significant amount of adhesions noted.  She presents today for follow up.  Reports she's been doing well, with bowel function, no significant pain, and ambulating around her house.  Denies any worsening issues with the incisions and the drains.  Vital signs: Ht '5\' 2"'$  (1.575 m)   Wt 182 lb (82.6 kg)   BMI 33.29 kg/m    Physical Exam: Constitutional: No acute distress Abdomen:  Soft, non-distended, non-tender to palpation.  Incisions are clean, dry, intact, with penrose drains and staples.  Both drains and all staples removed today, steri strips applied.  ABD pad dressing applied.  Assessment/Plan: This is a 77 y.o. female s/p colostomy reversal.  --Patient is doing well and recovering remarkably well given the open surgery and degree of adhesions and bowel manipulation.  Discussed with her that she can start a high fiber diet or supplement with fiber daily.  Steri strips will fall off on their own and it is ok to shower with them.  Reminded her of activity restrictions for total 4-6 weeks. --Follow up in 1 month.   Melvyn Neth, [facility]

## 2022-04-12 ENCOUNTER — Telehealth: Payer: Self-pay | Admitting: Pharmacist

## 2022-04-12 NOTE — Progress Notes (Signed)
[facility]) Care Management  [place]   04/12/2022  SHIANNA SORICE [birth year] [id number]  Reason for referral: medication assistance  Referral source:Care Guide Referral medication(s): Rybelsus Current insurance:[employer]  Medication Assistance Findings:  No medication assistance needs identified Patient was referred for medication assistance with several medications but the only one that had a program was Rybelsus through [employer].  Rybelsus was discontinued prior to the patient's surgery and is still discontinued. Since patient is no longer on Rybelsus, her medication assistance case will be closed.  Patient was given my contact information for questions or if something changes.     Additional medication assistance options reviewed with patient as warranted:  No other options identified  Plan: Close patient's case.  Elayne Guerin, PharmD, [place] Clinical Pharmacist [phone]

## 2022-05-05 ENCOUNTER — Ambulatory Visit (INDEPENDENT_AMBULATORY_CARE_PROVIDER_SITE_OTHER): Payer: Medicare HMO | Admitting: Surgery

## 2022-05-05 ENCOUNTER — Encounter: Payer: Self-pay | Admitting: Surgery

## 2022-05-05 VITALS — BP 128/48 | HR 74 | Temp 98.4°F | Ht 62.0 in | Wt 180.0 lb

## 2022-05-05 DIAGNOSIS — Z9889 Other specified postprocedural states: Secondary | ICD-10-CM

## 2022-05-05 DIAGNOSIS — Z09 Encounter for follow-up examination after completed treatment for conditions other than malignant neoplasm: Secondary | ICD-10-CM

## 2022-05-05 DIAGNOSIS — K660 Peritoneal adhesions (postprocedural) (postinfection): Secondary | ICD-10-CM

## 2022-05-05 NOTE — Patient Instructions (Addendum)
Continue with high fiber diet. You may resume all of your regular activities with out any restrictions.       Follow-up with our office as needed.  Please call and ask to speak with a nurse if you develop questions or concerns.

## 2022-05-05 NOTE — Progress Notes (Signed)
05/05/2022  HPI: Katelyn Simmons is a 77 y.o. female s/p colostomy reversal on 03/23/22.  Patient presents today for follow up.  She's now 6 weeks out from surgery.  She feels she's recovering well and is improving her ambulation.  She still feels tired at times, but continues to walk to build strength.  No issues with the incisions but reports sometimes pulling sensation with the ostomy site.  Vital signs: BP (!) 128/48   Pulse 74   Temp 98.4 F (36.9 C)   Ht 5\' 2"  (1.575 m)   Wt 180 lb (81.6 kg)   SpO2 97%   BMI 32.92 kg/m    Physical Exam: Constitutional:  No acute distress Abdomen:  Soft, non-distended, non-tender to palpation.  Incisions are well healed, without evidence of hernia.  Assessment/Plan: This is a 77 y.o. female s/p colostomy reversal.  --Patient is doing very well.  Discussed with her that she can resume full activities without restrictions now.  The pulling sensation in the ostomy site should continue to improve as the scar tissue softens, but may not fully go away. --Patient reports that she's had issues with her thyroid and wanted to make sure she can start treatment for it from the surgical standpoint.  No restrictions from wound/surgery side and can undergo treatment. --Follow up as needed   Melvyn Neth, [place]

## 2022-05-13 DIAGNOSIS — H16141 Punctate keratitis, right eye: Secondary | ICD-10-CM | POA: Diagnosis not present

## 2022-05-13 DIAGNOSIS — H16223 Keratoconjunctivitis sicca, not specified as Sjogren's, bilateral: Secondary | ICD-10-CM | POA: Diagnosis not present

## 2022-05-19 DIAGNOSIS — D2261 Melanocytic nevi of right upper limb, including shoulder: Secondary | ICD-10-CM | POA: Diagnosis not present

## 2022-05-19 DIAGNOSIS — L821 Other seborrheic keratosis: Secondary | ICD-10-CM | POA: Diagnosis not present

## 2022-05-19 DIAGNOSIS — D2271 Melanocytic nevi of right lower limb, including hip: Secondary | ICD-10-CM | POA: Diagnosis not present

## 2022-05-19 DIAGNOSIS — L658 Other specified nonscarring hair loss: Secondary | ICD-10-CM | POA: Diagnosis not present

## 2022-05-19 DIAGNOSIS — L578 Other skin changes due to chronic exposure to nonionizing radiation: Secondary | ICD-10-CM | POA: Diagnosis not present

## 2022-05-19 DIAGNOSIS — L820 Inflamed seborrheic keratosis: Secondary | ICD-10-CM | POA: Diagnosis not present

## 2022-05-19 DIAGNOSIS — D2272 Melanocytic nevi of left lower limb, including hip: Secondary | ICD-10-CM | POA: Diagnosis not present

## 2022-05-19 DIAGNOSIS — D2262 Melanocytic nevi of left upper limb, including shoulder: Secondary | ICD-10-CM | POA: Diagnosis not present

## 2022-05-19 DIAGNOSIS — D225 Melanocytic nevi of trunk: Secondary | ICD-10-CM | POA: Diagnosis not present

## 2022-05-27 DIAGNOSIS — E538 Deficiency of other specified B group vitamins: Secondary | ICD-10-CM | POA: Diagnosis not present

## 2022-05-27 DIAGNOSIS — E782 Mixed hyperlipidemia: Secondary | ICD-10-CM | POA: Diagnosis not present

## 2022-05-27 DIAGNOSIS — Z79899 Other long term (current) drug therapy: Secondary | ICD-10-CM | POA: Diagnosis not present

## 2022-05-27 DIAGNOSIS — E118 Type 2 diabetes mellitus with unspecified complications: Secondary | ICD-10-CM | POA: Diagnosis not present

## 2022-05-27 DIAGNOSIS — I10 Essential (primary) hypertension: Secondary | ICD-10-CM | POA: Diagnosis not present

## 2022-05-27 DIAGNOSIS — E890 Postprocedural hypothyroidism: Secondary | ICD-10-CM | POA: Diagnosis not present

## 2022-05-31 DIAGNOSIS — H16223 Keratoconjunctivitis sicca, not specified as Sjogren's, bilateral: Secondary | ICD-10-CM | POA: Diagnosis not present

## 2022-05-31 DIAGNOSIS — H35373 Puckering of macula, bilateral: Secondary | ICD-10-CM | POA: Diagnosis not present

## 2022-05-31 DIAGNOSIS — H0288B Meibomian gland dysfunction left eye, upper and lower eyelids: Secondary | ICD-10-CM | POA: Diagnosis not present

## 2022-05-31 DIAGNOSIS — H16141 Punctate keratitis, right eye: Secondary | ICD-10-CM | POA: Diagnosis not present

## 2022-05-31 DIAGNOSIS — E119 Type 2 diabetes mellitus without complications: Secondary | ICD-10-CM | POA: Diagnosis not present

## 2022-05-31 DIAGNOSIS — H0288A Meibomian gland dysfunction right eye, upper and lower eyelids: Secondary | ICD-10-CM | POA: Diagnosis not present

## 2022-05-31 DIAGNOSIS — Z0100 Encounter for examination of eyes and vision without abnormal findings: Secondary | ICD-10-CM | POA: Diagnosis not present

## 2022-05-31 DIAGNOSIS — H2513 Age-related nuclear cataract, bilateral: Secondary | ICD-10-CM | POA: Diagnosis not present

## 2022-05-31 DIAGNOSIS — H353132 Nonexudative age-related macular degeneration, bilateral, intermediate dry stage: Secondary | ICD-10-CM | POA: Diagnosis not present

## 2022-06-03 ENCOUNTER — Other Ambulatory Visit: Payer: Self-pay | Admitting: Internal Medicine

## 2022-06-03 DIAGNOSIS — Z1231 Encounter for screening mammogram for malignant neoplasm of breast: Secondary | ICD-10-CM

## 2022-06-03 DIAGNOSIS — E785 Hyperlipidemia, unspecified: Secondary | ICD-10-CM | POA: Diagnosis not present

## 2022-06-03 DIAGNOSIS — E669 Obesity, unspecified: Secondary | ICD-10-CM | POA: Diagnosis not present

## 2022-06-03 DIAGNOSIS — E890 Postprocedural hypothyroidism: Secondary | ICD-10-CM | POA: Diagnosis not present

## 2022-06-03 DIAGNOSIS — E079 Disorder of thyroid, unspecified: Secondary | ICD-10-CM | POA: Diagnosis not present

## 2022-06-03 DIAGNOSIS — E119 Type 2 diabetes mellitus without complications: Secondary | ICD-10-CM | POA: Diagnosis not present

## 2022-06-03 DIAGNOSIS — H9313 Tinnitus, bilateral: Secondary | ICD-10-CM | POA: Diagnosis not present

## 2022-06-03 DIAGNOSIS — I10 Essential (primary) hypertension: Secondary | ICD-10-CM | POA: Diagnosis not present

## 2022-06-14 ENCOUNTER — Ambulatory Visit
Admission: RE | Admit: 2022-06-14 | Discharge: 2022-06-14 | Disposition: A | Discharge status: Home / Self Care | Payer: Medicare HMO | Source: Ambulatory Visit | Attending: Internal Medicine | Admitting: Internal Medicine

## 2022-06-14 DIAGNOSIS — E890 Postprocedural hypothyroidism: Secondary | ICD-10-CM | POA: Diagnosis not present

## 2022-06-14 DIAGNOSIS — Z1231 Encounter for screening mammogram for malignant neoplasm of breast: Secondary | ICD-10-CM | POA: Diagnosis not present

## 2022-07-13 DIAGNOSIS — E890 Postprocedural hypothyroidism: Secondary | ICD-10-CM | POA: Diagnosis not present

## 2022-07-19 DIAGNOSIS — H90A22 Sensorineural hearing loss, unilateral, left ear, with restricted hearing on the contralateral side: Secondary | ICD-10-CM | POA: Diagnosis not present

## 2022-07-19 DIAGNOSIS — J301 Allergic rhinitis due to pollen: Secondary | ICD-10-CM | POA: Diagnosis not present

## 2022-07-19 DIAGNOSIS — H9313 Tinnitus, bilateral: Secondary | ICD-10-CM | POA: Diagnosis not present

## 2022-07-19 DIAGNOSIS — H6981 Other specified disorders of Eustachian tube, right ear: Secondary | ICD-10-CM | POA: Diagnosis not present

## 2022-08-18 DIAGNOSIS — I10 Essential (primary) hypertension: Secondary | ICD-10-CM | POA: Diagnosis not present

## 2022-08-18 DIAGNOSIS — E890 Postprocedural hypothyroidism: Secondary | ICD-10-CM | POA: Diagnosis not present

## 2022-08-24 DIAGNOSIS — E669 Obesity, unspecified: Secondary | ICD-10-CM | POA: Diagnosis not present

## 2022-08-24 DIAGNOSIS — R5383 Other fatigue: Secondary | ICD-10-CM | POA: Diagnosis not present

## 2022-08-24 DIAGNOSIS — E119 Type 2 diabetes mellitus without complications: Secondary | ICD-10-CM | POA: Diagnosis not present

## 2022-08-24 DIAGNOSIS — E890 Postprocedural hypothyroidism: Secondary | ICD-10-CM | POA: Diagnosis not present

## 2022-09-22 DIAGNOSIS — E785 Hyperlipidemia, unspecified: Secondary | ICD-10-CM | POA: Diagnosis not present

## 2022-09-22 DIAGNOSIS — E079 Disorder of thyroid, unspecified: Secondary | ICD-10-CM | POA: Diagnosis not present

## 2022-09-22 DIAGNOSIS — Z6834 Body mass index (BMI) 34.0-34.9, adult: Secondary | ICD-10-CM | POA: Diagnosis not present

## 2022-09-22 DIAGNOSIS — Z0000 Encounter for general adult medical examination without abnormal findings: Secondary | ICD-10-CM | POA: Diagnosis not present

## 2022-09-22 DIAGNOSIS — I10 Essential (primary) hypertension: Secondary | ICD-10-CM | POA: Diagnosis not present

## 2022-09-22 DIAGNOSIS — E119 Type 2 diabetes mellitus without complications: Secondary | ICD-10-CM | POA: Diagnosis not present

## 2022-09-22 DIAGNOSIS — E669 Obesity, unspecified: Secondary | ICD-10-CM | POA: Diagnosis not present

## 2022-10-25 DIAGNOSIS — E538 Deficiency of other specified B group vitamins: Secondary | ICD-10-CM | POA: Diagnosis not present

## 2022-11-22 DIAGNOSIS — E890 Postprocedural hypothyroidism: Secondary | ICD-10-CM | POA: Diagnosis not present

## 2022-11-22 DIAGNOSIS — E119 Type 2 diabetes mellitus without complications: Secondary | ICD-10-CM | POA: Diagnosis not present

## 2022-11-22 DIAGNOSIS — E669 Obesity, unspecified: Secondary | ICD-10-CM | POA: Diagnosis not present

## 2022-11-22 DIAGNOSIS — E538 Deficiency of other specified B group vitamins: Secondary | ICD-10-CM | POA: Diagnosis not present

## 2022-11-22 DIAGNOSIS — R2231 Localized swelling, mass and lump, right upper limb: Secondary | ICD-10-CM | POA: Diagnosis not present

## 2022-11-22 DIAGNOSIS — I10 Essential (primary) hypertension: Secondary | ICD-10-CM | POA: Diagnosis not present

## 2022-11-22 DIAGNOSIS — Z79899 Other long term (current) drug therapy: Secondary | ICD-10-CM | POA: Diagnosis not present

## 2022-11-22 DIAGNOSIS — Z6834 Body mass index (BMI) 34.0-34.9, adult: Secondary | ICD-10-CM | POA: Diagnosis not present

## 2022-11-22 DIAGNOSIS — E782 Mixed hyperlipidemia: Secondary | ICD-10-CM | POA: Diagnosis not present

## 2022-11-23 DIAGNOSIS — R8290 Unspecified abnormal findings in urine: Secondary | ICD-10-CM | POA: Diagnosis not present

## 2022-11-25 DIAGNOSIS — E538 Deficiency of other specified B group vitamins: Secondary | ICD-10-CM | POA: Diagnosis not present

## 2022-12-01 DIAGNOSIS — H2513 Age-related nuclear cataract, bilateral: Secondary | ICD-10-CM | POA: Diagnosis not present

## 2022-12-01 DIAGNOSIS — E119 Type 2 diabetes mellitus without complications: Secondary | ICD-10-CM | POA: Diagnosis not present

## 2022-12-01 DIAGNOSIS — H353132 Nonexudative age-related macular degeneration, bilateral, intermediate dry stage: Secondary | ICD-10-CM | POA: Diagnosis not present

## 2022-12-01 DIAGNOSIS — H35373 Puckering of macula, bilateral: Secondary | ICD-10-CM | POA: Diagnosis not present

## 2022-12-01 DIAGNOSIS — H0288B Meibomian gland dysfunction left eye, upper and lower eyelids: Secondary | ICD-10-CM | POA: Diagnosis not present

## 2022-12-01 DIAGNOSIS — H16141 Punctate keratitis, right eye: Secondary | ICD-10-CM | POA: Diagnosis not present

## 2022-12-01 DIAGNOSIS — H0288A Meibomian gland dysfunction right eye, upper and lower eyelids: Secondary | ICD-10-CM | POA: Diagnosis not present

## 2022-12-01 DIAGNOSIS — H16223 Keratoconjunctivitis sicca, not specified as Sjogren's, bilateral: Secondary | ICD-10-CM | POA: Diagnosis not present

## 2022-12-14 DIAGNOSIS — R2231 Localized swelling, mass and lump, right upper limb: Secondary | ICD-10-CM | POA: Diagnosis not present

## 2022-12-20 ENCOUNTER — Other Ambulatory Visit: Payer: Self-pay | Admitting: Pharmacy Technician

## 2022-12-20 DIAGNOSIS — Z5986 Financial insecurity: Secondary | ICD-10-CM

## 2022-12-20 NOTE — Progress Notes (Signed)
Pharmacy Medication Assistance Program Note    12/20/2022  Patient ID: Katelyn Simmons, female   DOB: 23-Jan-1946, 77 y.o.   MRN: [id number]     12/20/2022  Outreach Medication One  Initial Outreach Date (Medication One) 12/20/2022  Manufacturer Medication One [employer] Drugs Ozempic  Dose of Ozempic 2mg /16ml  Type of Radiographer, therapeutic Assistance  Date Application Sent to Patient 12/22/2022  Application Items Requested Application;Proof of Income;Other  Date Application Sent to Prescriber 12/22/2022  Name of Prescriber [facility]       Signature  Pattricia Boss, CPhT [facility]  Office: [phone] Fax: [phone] Email: [email or web address]

## 2022-12-27 DIAGNOSIS — E538 Deficiency of other specified B group vitamins: Secondary | ICD-10-CM | POA: Diagnosis not present

## 2023-01-28 DIAGNOSIS — E538 Deficiency of other specified B group vitamins: Secondary | ICD-10-CM | POA: Diagnosis not present

## 2023-02-07 ENCOUNTER — Other Ambulatory Visit (HOSPITAL_BASED_OUTPATIENT_CLINIC_OR_DEPARTMENT_OTHER): Payer: Self-pay

## 2023-02-07 ENCOUNTER — Other Ambulatory Visit (HOSPITAL_COMMUNITY): Payer: Self-pay

## 2023-02-08 ENCOUNTER — Other Ambulatory Visit (HOSPITAL_COMMUNITY): Payer: Self-pay

## 2023-02-08 ENCOUNTER — Other Ambulatory Visit (HOSPITAL_BASED_OUTPATIENT_CLINIC_OR_DEPARTMENT_OTHER): Payer: Self-pay

## 2023-02-08 ENCOUNTER — Other Ambulatory Visit: Payer: Self-pay

## 2023-02-08 MED ORDER — LEVOTHYROXINE SODIUM 150 MCG PO TABS
150.0000 ug | ORAL_TABLET | ORAL | 3 refills | Status: AC
  Filled 2023-02-08 – 2023-02-21 (×2): qty 90, 84d supply, fill #0
  Filled 2023-05-30 – 2023-06-13 (×3): qty 90, 84d supply, fill #1
  Filled 2023-09-06 – 2023-09-19 (×2): qty 90, 84d supply, fill #2

## 2023-02-08 MED ORDER — BUSPIRONE HCL 10 MG PO TABS
10.0000 mg | ORAL_TABLET | Freq: Two times a day (BID) | ORAL | 3 refills | Status: DC
  Filled 2023-05-30 – 2023-06-07 (×2): qty 180, 90d supply, fill #0

## 2023-02-08 MED ORDER — FOLIC ACID 1 MG PO TABS
1.0000 mg | ORAL_TABLET | Freq: Every day | ORAL | 3 refills | Status: AC
  Filled 2023-02-08 – 2023-04-10 (×2): qty 90, 90d supply, fill #0
  Filled 2023-07-31: qty 90, 90d supply, fill #1

## 2023-02-08 MED ORDER — METOPROLOL SUCCINATE ER 100 MG PO TB24
100.0000 mg | ORAL_TABLET | Freq: Every day | ORAL | 3 refills | Status: DC
  Filled 2023-02-21 – 2023-06-29 (×4): qty 90, 90d supply, fill #0

## 2023-02-08 MED ORDER — TRUEPLUS LANCETS 33G MISC
2 refills | Status: AC
  Filled 2023-02-08: qty 300, 100d supply, fill #0

## 2023-02-08 MED ORDER — FINASTERIDE 1 MG PO TABS
1.0000 mg | ORAL_TABLET | Freq: Every day | ORAL | 4 refills | Status: AC
  Filled 2023-02-08 – 2023-02-21 (×2): qty 90, 90d supply, fill #0

## 2023-02-08 MED ORDER — HYDROCHLOROTHIAZIDE 25 MG PO TABS
25.0000 mg | ORAL_TABLET | Freq: Every day | ORAL | 3 refills | Status: DC
  Filled 2023-02-08 – 2023-02-21 (×2): qty 90, 90d supply, fill #0

## 2023-02-08 MED ORDER — OZEMPIC (0.25 OR 0.5 MG/DOSE) 2 MG/3ML ~~LOC~~ SOPN
0.2500 mg | PEN_INJECTOR | SUBCUTANEOUS | 3 refills | Status: AC
  Filled 2023-02-08 – 2023-02-09 (×2): qty 3, 56d supply, fill #0
  Filled 2023-07-31 – 2023-09-06 (×2): qty 3, 56d supply, fill #1

## 2023-02-08 MED ORDER — VENLAFAXINE HCL ER 75 MG PO CP24
75.0000 mg | ORAL_CAPSULE | Freq: Every day | ORAL | 3 refills | Status: DC
  Filled 2023-05-30 – 2023-06-07 (×2): qty 90, 90d supply, fill #0

## 2023-02-09 ENCOUNTER — Other Ambulatory Visit (HOSPITAL_COMMUNITY): Payer: Self-pay

## 2023-02-10 ENCOUNTER — Other Ambulatory Visit: Payer: Self-pay

## 2023-02-10 ENCOUNTER — Other Ambulatory Visit (HOSPITAL_BASED_OUTPATIENT_CLINIC_OR_DEPARTMENT_OTHER): Payer: Self-pay

## 2023-02-10 ENCOUNTER — Other Ambulatory Visit (HOSPITAL_COMMUNITY): Payer: Self-pay

## 2023-02-10 ENCOUNTER — Telehealth: Payer: Self-pay | Admitting: Pharmacy Technician

## 2023-02-10 DIAGNOSIS — Z5986 Financial insecurity: Secondary | ICD-10-CM

## 2023-02-10 DIAGNOSIS — E66811 Obesity, class 1: Secondary | ICD-10-CM | POA: Diagnosis not present

## 2023-02-10 DIAGNOSIS — E119 Type 2 diabetes mellitus without complications: Secondary | ICD-10-CM | POA: Diagnosis not present

## 2023-02-10 DIAGNOSIS — E890 Postprocedural hypothyroidism: Secondary | ICD-10-CM | POA: Diagnosis not present

## 2023-02-10 MED ORDER — OZEMPIC (1 MG/DOSE) 4 MG/3ML ~~LOC~~ SOPN
1.0000 mg | PEN_INJECTOR | SUBCUTANEOUS | 3 refills | Status: DC
  Filled 2023-02-10: qty 3, 28d supply, fill #0
  Filled 2023-04-10: qty 3, 28d supply, fill #1
  Filled 2023-05-10: qty 3, 28d supply, fill #2
  Filled 2023-05-30 – 2023-05-31 (×2): qty 3, 28d supply, fill #3

## 2023-02-10 NOTE — Progress Notes (Signed)
 Pharmacy Medication Assistance Program Note    02/10/2023  Patient ID: Katelyn Simmons, female   DOB: 1945-05-18, 78 y.o.   MRN: [id number]     12/20/2022 02/10/2023  Outreach Medication One  Initial Outreach Date (Medication One) 12/20/2022   Manufacturer Medication One Novo Nordisk   [employer] Drugs Ozempic    Dose of Ozempic  2mg /31ml   Type of Sport And Exercise Psychologist   Date Application Sent to Patient 12/22/2022   Application Items Requested Application;Proof of Income;Other   Date Application Sent to Prescriber 12/22/2022   Name of Prescriber Reyes Costa   Date Application Received From Patient  02/09/2023  Application Items Received From Patient  Application;Proof of Income;Other  Date Application Received From Provider  12/23/2022  Date Application Submitted to Manufacturer  02/09/2023  Method Application Sent to Manufacturer  Fax     Signature   Kate Marzette Sola [facility]  Office: [phone] Fax: [phone] Email: [email or web address]

## 2023-02-11 ENCOUNTER — Other Ambulatory Visit (HOSPITAL_COMMUNITY): Payer: Self-pay

## 2023-02-17 ENCOUNTER — Telehealth: Payer: Self-pay | Admitting: Pharmacy Technician

## 2023-02-17 DIAGNOSIS — Z5986 Financial insecurity: Secondary | ICD-10-CM

## 2023-02-17 NOTE — Progress Notes (Signed)
Pharmacy Medication Assistance Program Note    02/17/2023  Patient ID: Katelyn Simmons, female   DOB: 1945-12-01, 78 y.o.   MRN: [id number]     12/20/2022 02/10/2023 02/17/2023  Outreach Medication One  Initial Outreach Date (Medication One) 12/20/2022    Manufacturer Medication One [employer] [facility] Ozempic    Dose of Ozempic 2mg /28ml    Type of Radiographer, therapeutic Assistance    Date Application Sent to Patient 12/22/2022    Application Items Requested Application;Proof of Income;Other    Date Application Sent to Prescriber 12/22/2022    Name of Prescriber Aram Beecham    Date Application Received From Patient  02/09/2023   Application Items Received From Patient  Application;Proof of Income;Other   Date Application Received From Provider  12/23/2022   Date Application Submitted to Manufacturer  02/09/2023   Method Application Sent to Manufacturer  Fax   Patient Assistance Determination   Approved  Approval Start Date   02/11/2023  Approval End Date   02/01/2024  Patient Notification Method   Telephone Call  Telephone Call Outcome   Successful    Care coordination call placed to Novo Nordisk in regard to Ozempic application.   Spoke to [facility] who informs patient is APPROVED 02/11/23-02/06/2024. She informs the new dose request form for Ozempic 1mg  was received on 02/11/23 and the order began processing on that date. She informs delivery would be in 10-14 business days from that date with delivery to the prescriber's office. She informs subsequent refills will process automatically with delivery to the prescriber's office as well. She informs patient may call [employer] at any time to check on future shipments by calling [phone].  Successful outreach to patient. HIPAA verified. Notified patient of her approval, refill procedure and expected delivery date.. Patient verbalized understanding.  Pattricia Boss, CPhT [place]  Office: [phone] Fax: [phone] Email:  [email or web address]

## 2023-02-21 ENCOUNTER — Other Ambulatory Visit (HOSPITAL_COMMUNITY): Payer: Self-pay

## 2023-02-21 ENCOUNTER — Other Ambulatory Visit: Payer: Self-pay

## 2023-02-21 MED ORDER — QUETIAPINE FUMARATE 50 MG PO TABS
50.0000 mg | ORAL_TABLET | Freq: Every day | ORAL | 3 refills | Status: DC
  Filled 2023-02-21: qty 90, 90d supply, fill #0
  Filled 2023-04-10 – 2023-06-13 (×4): qty 90, 90d supply, fill #1
  Filled 2023-09-06 – 2023-09-19 (×2): qty 90, 90d supply, fill #2
  Filled 2023-12-13: qty 90, 90d supply, fill #3

## 2023-02-21 MED ORDER — LEVOTHYROXINE SODIUM 150 MCG PO TABS
150.0000 ug | ORAL_TABLET | Freq: Every day | ORAL | 3 refills | Status: AC
  Filled 2023-02-21: qty 90, 90d supply, fill #0
  Filled 2023-04-10 – 2024-02-04 (×5): qty 90, 84d supply, fill #0

## 2023-02-21 MED ORDER — FINASTERIDE 1 MG PO TABS
1.0000 mg | ORAL_TABLET | Freq: Every day | ORAL | 3 refills | Status: AC
  Filled 2023-02-21: qty 90, 90d supply, fill #0
  Filled 2023-04-10 – 2023-12-15 (×5): qty 90, 90d supply, fill #1

## 2023-02-21 MED ORDER — PHENTERMINE HCL 37.5 MG PO TABS
37.5000 mg | ORAL_TABLET | Freq: Every day | ORAL | 0 refills | Status: DC
  Filled 2023-02-21: qty 90, 90d supply, fill #0

## 2023-02-22 ENCOUNTER — Other Ambulatory Visit (HOSPITAL_COMMUNITY): Payer: Self-pay

## 2023-02-23 ENCOUNTER — Other Ambulatory Visit (HOSPITAL_COMMUNITY): Payer: Self-pay

## 2023-02-23 MED ORDER — TRUEPLUS LANCETS 33G MISC
2 refills | Status: AC
  Filled 2023-02-23: qty 300, 100d supply, fill #0
  Filled 2023-06-13 – 2023-07-31 (×2): qty 300, 100d supply, fill #1

## 2023-02-24 ENCOUNTER — Other Ambulatory Visit: Payer: Self-pay

## 2023-02-24 ENCOUNTER — Other Ambulatory Visit (HOSPITAL_COMMUNITY): Payer: Self-pay

## 2023-02-25 ENCOUNTER — Other Ambulatory Visit: Payer: Self-pay

## 2023-02-28 DIAGNOSIS — E890 Postprocedural hypothyroidism: Secondary | ICD-10-CM | POA: Diagnosis not present

## 2023-02-28 DIAGNOSIS — E538 Deficiency of other specified B group vitamins: Secondary | ICD-10-CM | POA: Diagnosis not present

## 2023-03-02 ENCOUNTER — Other Ambulatory Visit: Payer: Self-pay | Admitting: Internal Medicine

## 2023-03-02 DIAGNOSIS — Z1231 Encounter for screening mammogram for malignant neoplasm of breast: Secondary | ICD-10-CM

## 2023-03-02 DIAGNOSIS — E782 Mixed hyperlipidemia: Secondary | ICD-10-CM | POA: Diagnosis not present

## 2023-03-02 DIAGNOSIS — E890 Postprocedural hypothyroidism: Secondary | ICD-10-CM | POA: Diagnosis not present

## 2023-03-02 DIAGNOSIS — E1122 Type 2 diabetes mellitus with diabetic chronic kidney disease: Secondary | ICD-10-CM | POA: Diagnosis not present

## 2023-03-02 DIAGNOSIS — E538 Deficiency of other specified B group vitamins: Secondary | ICD-10-CM | POA: Diagnosis not present

## 2023-03-02 DIAGNOSIS — Z0000 Encounter for general adult medical examination without abnormal findings: Secondary | ICD-10-CM | POA: Diagnosis not present

## 2023-03-02 DIAGNOSIS — I10 Essential (primary) hypertension: Secondary | ICD-10-CM | POA: Diagnosis not present

## 2023-03-02 DIAGNOSIS — N183 Chronic kidney disease, stage 3 unspecified: Secondary | ICD-10-CM | POA: Diagnosis not present

## 2023-03-02 DIAGNOSIS — Z933 Colostomy status: Secondary | ICD-10-CM | POA: Diagnosis not present

## 2023-03-02 DIAGNOSIS — M0609 Rheumatoid arthritis without rheumatoid factor, multiple sites: Secondary | ICD-10-CM | POA: Diagnosis not present

## 2023-03-02 DIAGNOSIS — E118 Type 2 diabetes mellitus with unspecified complications: Secondary | ICD-10-CM | POA: Diagnosis not present

## 2023-03-02 DIAGNOSIS — Z79899 Other long term (current) drug therapy: Secondary | ICD-10-CM | POA: Diagnosis not present

## 2023-03-10 ENCOUNTER — Other Ambulatory Visit (HOSPITAL_COMMUNITY): Payer: Self-pay

## 2023-03-10 MED ORDER — TRAMADOL HCL 50 MG PO TABS
50.0000 mg | ORAL_TABLET | Freq: Two times a day (BID) | ORAL | 0 refills | Status: AC | PRN
  Filled 2023-03-10: qty 40, 20d supply, fill #0

## 2023-03-15 DIAGNOSIS — M8588 Other specified disorders of bone density and structure, other site: Secondary | ICD-10-CM | POA: Diagnosis not present

## 2023-03-16 ENCOUNTER — Other Ambulatory Visit: Payer: Self-pay

## 2023-03-16 ENCOUNTER — Other Ambulatory Visit (HOSPITAL_COMMUNITY): Payer: Self-pay

## 2023-03-17 ENCOUNTER — Other Ambulatory Visit (HOSPITAL_BASED_OUTPATIENT_CLINIC_OR_DEPARTMENT_OTHER): Payer: Self-pay

## 2023-03-31 DIAGNOSIS — E538 Deficiency of other specified B group vitamins: Secondary | ICD-10-CM | POA: Diagnosis not present

## 2023-04-10 ENCOUNTER — Other Ambulatory Visit (HOSPITAL_COMMUNITY): Payer: Self-pay

## 2023-04-11 ENCOUNTER — Other Ambulatory Visit (HOSPITAL_COMMUNITY): Payer: Self-pay

## 2023-04-11 ENCOUNTER — Other Ambulatory Visit: Payer: Self-pay

## 2023-04-11 MED ORDER — PHENTERMINE HCL 37.5 MG PO TABS
37.5000 mg | ORAL_TABLET | Freq: Every day | ORAL | 0 refills | Status: DC
  Filled 2023-04-11 – 2023-06-13 (×4): qty 90, 90d supply, fill #0

## 2023-04-27 ENCOUNTER — Other Ambulatory Visit: Payer: Self-pay

## 2023-04-27 ENCOUNTER — Other Ambulatory Visit (HOSPITAL_COMMUNITY): Payer: Self-pay

## 2023-04-27 DIAGNOSIS — M069 Rheumatoid arthritis, unspecified: Secondary | ICD-10-CM | POA: Diagnosis not present

## 2023-04-27 DIAGNOSIS — M0609 Rheumatoid arthritis without rheumatoid factor, multiple sites: Secondary | ICD-10-CM | POA: Diagnosis not present

## 2023-04-27 DIAGNOSIS — Z79899 Other long term (current) drug therapy: Secondary | ICD-10-CM | POA: Diagnosis not present

## 2023-04-27 MED ORDER — METHOTREXATE SODIUM 2.5 MG PO TABS
20.0000 mg | ORAL_TABLET | ORAL | 2 refills | Status: DC
  Filled 2023-04-27: qty 32, 28d supply, fill #0
  Filled 2023-05-30 – 2023-06-13 (×3): qty 32, 28d supply, fill #1
  Filled 2023-07-31: qty 32, 28d supply, fill #2

## 2023-04-27 MED ORDER — PREDNISONE 5 MG PO TABS
ORAL_TABLET | ORAL | 0 refills | Status: DC
  Filled 2023-04-27: qty 30, 12d supply, fill #0

## 2023-04-27 MED ORDER — FOLIC ACID 1 MG PO TABS
1.0000 mg | ORAL_TABLET | Freq: Every day | ORAL | 3 refills | Status: AC
Start: 2023-04-27 — End: ?
  Filled 2023-04-27: qty 90, 90d supply, fill #0

## 2023-05-02 DIAGNOSIS — E538 Deficiency of other specified B group vitamins: Secondary | ICD-10-CM | POA: Diagnosis not present

## 2023-05-10 ENCOUNTER — Other Ambulatory Visit (HOSPITAL_COMMUNITY): Payer: Self-pay

## 2023-05-10 ENCOUNTER — Other Ambulatory Visit: Payer: Self-pay

## 2023-05-20 ENCOUNTER — Other Ambulatory Visit (HOSPITAL_COMMUNITY): Payer: Self-pay

## 2023-05-30 ENCOUNTER — Other Ambulatory Visit (HOSPITAL_COMMUNITY): Payer: Self-pay

## 2023-05-30 ENCOUNTER — Other Ambulatory Visit (INDEPENDENT_AMBULATORY_CARE_PROVIDER_SITE_OTHER): Payer: Self-pay | Admitting: Surgery

## 2023-05-31 ENCOUNTER — Other Ambulatory Visit: Payer: Self-pay

## 2023-05-31 ENCOUNTER — Encounter: Payer: Self-pay | Admitting: Pharmacist

## 2023-06-02 DIAGNOSIS — R8290 Unspecified abnormal findings in urine: Secondary | ICD-10-CM | POA: Diagnosis not present

## 2023-06-02 DIAGNOSIS — Z79899 Other long term (current) drug therapy: Secondary | ICD-10-CM | POA: Diagnosis not present

## 2023-06-02 DIAGNOSIS — I10 Essential (primary) hypertension: Secondary | ICD-10-CM | POA: Diagnosis not present

## 2023-06-02 DIAGNOSIS — E890 Postprocedural hypothyroidism: Secondary | ICD-10-CM | POA: Diagnosis not present

## 2023-06-02 DIAGNOSIS — E782 Mixed hyperlipidemia: Secondary | ICD-10-CM | POA: Diagnosis not present

## 2023-06-02 DIAGNOSIS — E1122 Type 2 diabetes mellitus with diabetic chronic kidney disease: Secondary | ICD-10-CM | POA: Diagnosis not present

## 2023-06-03 ENCOUNTER — Other Ambulatory Visit: Payer: Self-pay

## 2023-06-07 ENCOUNTER — Other Ambulatory Visit (HOSPITAL_COMMUNITY): Payer: Self-pay

## 2023-06-07 ENCOUNTER — Other Ambulatory Visit: Payer: Self-pay

## 2023-06-07 DIAGNOSIS — H5211 Myopia, right eye: Secondary | ICD-10-CM | POA: Diagnosis not present

## 2023-06-07 DIAGNOSIS — E119 Type 2 diabetes mellitus without complications: Secondary | ICD-10-CM | POA: Diagnosis not present

## 2023-06-07 DIAGNOSIS — H16141 Punctate keratitis, right eye: Secondary | ICD-10-CM | POA: Diagnosis not present

## 2023-06-07 DIAGNOSIS — H2513 Age-related nuclear cataract, bilateral: Secondary | ICD-10-CM | POA: Diagnosis not present

## 2023-06-07 DIAGNOSIS — H16223 Keratoconjunctivitis sicca, not specified as Sjogren's, bilateral: Secondary | ICD-10-CM | POA: Diagnosis not present

## 2023-06-07 DIAGNOSIS — H353132 Nonexudative age-related macular degeneration, bilateral, intermediate dry stage: Secondary | ICD-10-CM | POA: Diagnosis not present

## 2023-06-07 DIAGNOSIS — H0288A Meibomian gland dysfunction right eye, upper and lower eyelids: Secondary | ICD-10-CM | POA: Diagnosis not present

## 2023-06-07 DIAGNOSIS — H0288B Meibomian gland dysfunction left eye, upper and lower eyelids: Secondary | ICD-10-CM | POA: Diagnosis not present

## 2023-06-07 DIAGNOSIS — H35373 Puckering of macula, bilateral: Secondary | ICD-10-CM | POA: Diagnosis not present

## 2023-06-08 ENCOUNTER — Other Ambulatory Visit (HOSPITAL_COMMUNITY): Payer: Self-pay

## 2023-06-08 ENCOUNTER — Other Ambulatory Visit: Payer: Self-pay

## 2023-06-08 MED ORDER — FINASTERIDE 1 MG PO TABS
1.0000 mg | ORAL_TABLET | Freq: Every day | ORAL | 3 refills | Status: AC
  Filled 2023-06-08 – 2023-06-13 (×2): qty 90, 90d supply, fill #0
  Filled 2023-12-15: qty 90, 90d supply, fill #1

## 2023-06-09 ENCOUNTER — Other Ambulatory Visit (HOSPITAL_COMMUNITY): Payer: Self-pay

## 2023-06-09 ENCOUNTER — Other Ambulatory Visit: Payer: Self-pay

## 2023-06-09 DIAGNOSIS — I10 Essential (primary) hypertension: Secondary | ICD-10-CM | POA: Diagnosis not present

## 2023-06-09 DIAGNOSIS — E118 Type 2 diabetes mellitus with unspecified complications: Secondary | ICD-10-CM | POA: Diagnosis not present

## 2023-06-09 DIAGNOSIS — E890 Postprocedural hypothyroidism: Secondary | ICD-10-CM | POA: Diagnosis not present

## 2023-06-09 DIAGNOSIS — E782 Mixed hyperlipidemia: Secondary | ICD-10-CM | POA: Diagnosis not present

## 2023-06-09 DIAGNOSIS — Z1231 Encounter for screening mammogram for malignant neoplasm of breast: Secondary | ICD-10-CM | POA: Diagnosis not present

## 2023-06-09 DIAGNOSIS — M0609 Rheumatoid arthritis without rheumatoid factor, multiple sites: Secondary | ICD-10-CM | POA: Diagnosis not present

## 2023-06-09 DIAGNOSIS — Z79899 Other long term (current) drug therapy: Secondary | ICD-10-CM | POA: Diagnosis not present

## 2023-06-09 DIAGNOSIS — E538 Deficiency of other specified B group vitamins: Secondary | ICD-10-CM | POA: Diagnosis not present

## 2023-06-09 DIAGNOSIS — N183 Chronic kidney disease, stage 3 unspecified: Secondary | ICD-10-CM | POA: Diagnosis not present

## 2023-06-09 MED ORDER — OZEMPIC (2 MG/DOSE) 8 MG/3ML ~~LOC~~ SOPN
2.0000 mg | PEN_INJECTOR | SUBCUTANEOUS | 1 refills | Status: DC
Start: 2023-06-09 — End: 2023-09-06
  Filled 2023-06-09: qty 6, 56d supply, fill #0
  Filled 2023-07-31: qty 6, 56d supply, fill #1

## 2023-06-09 MED ORDER — ROSUVASTATIN CALCIUM 10 MG PO TABS
10.0000 mg | ORAL_TABLET | Freq: Every day | ORAL | 3 refills | Status: AC
  Filled 2023-06-09: qty 90, 90d supply, fill #0
  Filled 2023-09-06 – 2023-09-19 (×2): qty 90, 90d supply, fill #1
  Filled 2024-01-17: qty 90, 90d supply, fill #2

## 2023-06-10 ENCOUNTER — Other Ambulatory Visit: Payer: Self-pay

## 2023-06-13 ENCOUNTER — Other Ambulatory Visit (HOSPITAL_COMMUNITY): Payer: Self-pay

## 2023-06-13 ENCOUNTER — Other Ambulatory Visit: Payer: Self-pay

## 2023-06-13 MED ORDER — HYDROCHLOROTHIAZIDE 25 MG PO TABS
25.0000 mg | ORAL_TABLET | Freq: Every day | ORAL | 3 refills | Status: AC
  Filled 2023-06-13: qty 90, 90d supply, fill #0
  Filled 2023-09-19: qty 90, 90d supply, fill #1

## 2023-06-15 DIAGNOSIS — E119 Type 2 diabetes mellitus without complications: Secondary | ICD-10-CM | POA: Diagnosis not present

## 2023-06-15 DIAGNOSIS — E890 Postprocedural hypothyroidism: Secondary | ICD-10-CM | POA: Diagnosis not present

## 2023-06-28 DIAGNOSIS — L570 Actinic keratosis: Secondary | ICD-10-CM | POA: Diagnosis not present

## 2023-06-28 DIAGNOSIS — D225 Melanocytic nevi of trunk: Secondary | ICD-10-CM | POA: Diagnosis not present

## 2023-06-28 DIAGNOSIS — L659 Nonscarring hair loss, unspecified: Secondary | ICD-10-CM | POA: Diagnosis not present

## 2023-06-28 DIAGNOSIS — D2261 Melanocytic nevi of right upper limb, including shoulder: Secondary | ICD-10-CM | POA: Diagnosis not present

## 2023-06-28 DIAGNOSIS — D2262 Melanocytic nevi of left upper limb, including shoulder: Secondary | ICD-10-CM | POA: Diagnosis not present

## 2023-06-28 DIAGNOSIS — L821 Other seborrheic keratosis: Secondary | ICD-10-CM | POA: Diagnosis not present

## 2023-06-28 DIAGNOSIS — D2271 Melanocytic nevi of right lower limb, including hip: Secondary | ICD-10-CM | POA: Diagnosis not present

## 2023-06-28 DIAGNOSIS — D2272 Melanocytic nevi of left lower limb, including hip: Secondary | ICD-10-CM | POA: Diagnosis not present

## 2023-06-29 ENCOUNTER — Other Ambulatory Visit: Payer: Self-pay

## 2023-06-29 ENCOUNTER — Other Ambulatory Visit (HOSPITAL_COMMUNITY): Payer: Self-pay

## 2023-07-11 DIAGNOSIS — E538 Deficiency of other specified B group vitamins: Secondary | ICD-10-CM | POA: Diagnosis not present

## 2023-07-31 ENCOUNTER — Other Ambulatory Visit (HOSPITAL_COMMUNITY): Payer: Self-pay

## 2023-08-01 ENCOUNTER — Other Ambulatory Visit: Payer: Self-pay

## 2023-08-01 ENCOUNTER — Other Ambulatory Visit (HOSPITAL_COMMUNITY): Payer: Self-pay

## 2023-08-01 MED ORDER — VENLAFAXINE HCL ER 75 MG PO CP24
75.0000 mg | ORAL_CAPSULE | Freq: Every day | ORAL | 3 refills | Status: AC
  Filled 2023-08-01: qty 90, 90d supply, fill #0
  Filled 2024-01-17: qty 90, 90d supply, fill #1

## 2023-08-11 DIAGNOSIS — E538 Deficiency of other specified B group vitamins: Secondary | ICD-10-CM | POA: Diagnosis not present

## 2023-08-23 ENCOUNTER — Ambulatory Visit
Admission: RE | Admit: 2023-08-23 | Discharge: 2023-08-23 | Disposition: A | Discharge status: Home / Self Care | Source: Ambulatory Visit | Attending: Internal Medicine | Admitting: Internal Medicine

## 2023-08-23 DIAGNOSIS — Z1231 Encounter for screening mammogram for malignant neoplasm of breast: Secondary | ICD-10-CM | POA: Insufficient documentation

## 2023-09-06 ENCOUNTER — Other Ambulatory Visit: Payer: Self-pay

## 2023-09-06 ENCOUNTER — Other Ambulatory Visit (HOSPITAL_COMMUNITY): Payer: Self-pay

## 2023-09-06 MED ORDER — OZEMPIC (2 MG/DOSE) 8 MG/3ML ~~LOC~~ SOPN
2.0000 mg | PEN_INJECTOR | SUBCUTANEOUS | 1 refills | Status: DC
  Filled 2023-09-06 – 2023-09-12 (×3): qty 6, 56d supply, fill #0
  Filled 2023-12-13: qty 6, 56d supply, fill #1

## 2023-09-06 MED ORDER — BUSPIRONE HCL 10 MG PO TABS
10.0000 mg | ORAL_TABLET | Freq: Two times a day (BID) | ORAL | 3 refills | Status: AC
  Filled 2023-09-06: qty 180, 90d supply, fill #0

## 2023-09-06 MED ORDER — PHENTERMINE HCL 37.5 MG PO TABS
37.5000 mg | ORAL_TABLET | Freq: Every day | ORAL | 0 refills | Status: DC
  Filled 2023-09-09 – 2023-09-19 (×2): qty 90, 90d supply, fill #0

## 2023-09-07 ENCOUNTER — Other Ambulatory Visit (HOSPITAL_COMMUNITY): Payer: Self-pay

## 2023-09-07 ENCOUNTER — Other Ambulatory Visit: Payer: Self-pay

## 2023-09-07 ENCOUNTER — Encounter: Payer: Self-pay | Admitting: Pharmacist

## 2023-09-08 DIAGNOSIS — Z79899 Other long term (current) drug therapy: Secondary | ICD-10-CM | POA: Diagnosis not present

## 2023-09-08 DIAGNOSIS — E782 Mixed hyperlipidemia: Secondary | ICD-10-CM | POA: Diagnosis not present

## 2023-09-08 DIAGNOSIS — E118 Type 2 diabetes mellitus with unspecified complications: Secondary | ICD-10-CM | POA: Diagnosis not present

## 2023-09-08 DIAGNOSIS — E890 Postprocedural hypothyroidism: Secondary | ICD-10-CM | POA: Diagnosis not present

## 2023-09-09 ENCOUNTER — Other Ambulatory Visit: Payer: Self-pay

## 2023-09-09 ENCOUNTER — Other Ambulatory Visit (HOSPITAL_COMMUNITY): Payer: Self-pay

## 2023-09-12 ENCOUNTER — Other Ambulatory Visit: Payer: Self-pay

## 2023-09-12 ENCOUNTER — Other Ambulatory Visit (HOSPITAL_COMMUNITY): Payer: Self-pay

## 2023-09-14 ENCOUNTER — Other Ambulatory Visit: Payer: Self-pay

## 2023-09-19 ENCOUNTER — Other Ambulatory Visit (HOSPITAL_COMMUNITY): Payer: Self-pay

## 2023-09-19 ENCOUNTER — Other Ambulatory Visit: Payer: Self-pay

## 2023-09-19 MED ORDER — METOPROLOL SUCCINATE ER 100 MG PO TB24
100.0000 mg | ORAL_TABLET | Freq: Every day | ORAL | 3 refills | Status: AC
  Filled 2023-09-19: qty 90, 90d supply, fill #0
  Filled 2024-01-17: qty 90, 90d supply, fill #1

## 2023-09-20 ENCOUNTER — Encounter: Payer: Self-pay | Admitting: Pharmacist

## 2023-09-20 ENCOUNTER — Other Ambulatory Visit: Payer: Self-pay

## 2023-09-21 ENCOUNTER — Other Ambulatory Visit (HOSPITAL_COMMUNITY): Payer: Self-pay

## 2023-09-21 DIAGNOSIS — E538 Deficiency of other specified B group vitamins: Secondary | ICD-10-CM | POA: Diagnosis not present

## 2023-09-21 DIAGNOSIS — E890 Postprocedural hypothyroidism: Secondary | ICD-10-CM | POA: Diagnosis not present

## 2023-09-21 DIAGNOSIS — E782 Mixed hyperlipidemia: Secondary | ICD-10-CM | POA: Diagnosis not present

## 2023-09-21 DIAGNOSIS — E118 Type 2 diabetes mellitus with unspecified complications: Secondary | ICD-10-CM | POA: Diagnosis not present

## 2023-09-21 DIAGNOSIS — E66811 Obesity, class 1: Secondary | ICD-10-CM | POA: Diagnosis not present

## 2023-09-21 DIAGNOSIS — I10 Essential (primary) hypertension: Secondary | ICD-10-CM | POA: Diagnosis not present

## 2023-09-28 ENCOUNTER — Other Ambulatory Visit (HOSPITAL_COMMUNITY): Payer: Self-pay

## 2023-10-07 ENCOUNTER — Other Ambulatory Visit (HOSPITAL_COMMUNITY): Payer: Self-pay

## 2023-10-11 ENCOUNTER — Other Ambulatory Visit (HOSPITAL_COMMUNITY): Payer: Self-pay

## 2023-10-13 ENCOUNTER — Other Ambulatory Visit (HOSPITAL_COMMUNITY): Payer: Self-pay

## 2023-10-14 ENCOUNTER — Other Ambulatory Visit (HOSPITAL_COMMUNITY): Payer: Self-pay

## 2023-10-17 ENCOUNTER — Other Ambulatory Visit (HOSPITAL_COMMUNITY): Payer: Self-pay

## 2023-10-17 DIAGNOSIS — Z79899 Other long term (current) drug therapy: Secondary | ICD-10-CM | POA: Diagnosis not present

## 2023-10-17 DIAGNOSIS — M3500 Sicca syndrome, unspecified: Secondary | ICD-10-CM | POA: Diagnosis not present

## 2023-10-17 DIAGNOSIS — M0609 Rheumatoid arthritis without rheumatoid factor, multiple sites: Secondary | ICD-10-CM | POA: Diagnosis not present

## 2023-10-17 MED ORDER — METHOTREXATE SODIUM 2.5 MG PO TABS
20.0000 mg | ORAL_TABLET | ORAL | 0 refills | Status: AC
  Filled 2023-10-17: qty 96, 84d supply, fill #0

## 2023-10-19 ENCOUNTER — Other Ambulatory Visit (HOSPITAL_COMMUNITY): Payer: Self-pay

## 2023-10-20 ENCOUNTER — Other Ambulatory Visit (HOSPITAL_COMMUNITY): Payer: Self-pay

## 2023-10-20 ENCOUNTER — Encounter (HOSPITAL_COMMUNITY): Payer: Self-pay

## 2023-10-26 DIAGNOSIS — E782 Mixed hyperlipidemia: Secondary | ICD-10-CM | POA: Diagnosis not present

## 2023-10-26 DIAGNOSIS — N183 Chronic kidney disease, stage 3 unspecified: Secondary | ICD-10-CM | POA: Diagnosis not present

## 2023-10-26 DIAGNOSIS — E890 Postprocedural hypothyroidism: Secondary | ICD-10-CM | POA: Diagnosis not present

## 2023-10-26 DIAGNOSIS — I10 Essential (primary) hypertension: Secondary | ICD-10-CM | POA: Diagnosis not present

## 2023-10-26 DIAGNOSIS — E118 Type 2 diabetes mellitus with unspecified complications: Secondary | ICD-10-CM | POA: Diagnosis not present

## 2023-10-26 DIAGNOSIS — E538 Deficiency of other specified B group vitamins: Secondary | ICD-10-CM | POA: Diagnosis not present

## 2023-11-28 DIAGNOSIS — E538 Deficiency of other specified B group vitamins: Secondary | ICD-10-CM | POA: Diagnosis not present

## 2023-12-08 DIAGNOSIS — H2513 Age-related nuclear cataract, bilateral: Secondary | ICD-10-CM | POA: Diagnosis not present

## 2023-12-08 DIAGNOSIS — H35373 Puckering of macula, bilateral: Secondary | ICD-10-CM | POA: Diagnosis not present

## 2023-12-08 DIAGNOSIS — H16141 Punctate keratitis, right eye: Secondary | ICD-10-CM | POA: Diagnosis not present

## 2023-12-08 DIAGNOSIS — H0288B Meibomian gland dysfunction left eye, upper and lower eyelids: Secondary | ICD-10-CM | POA: Diagnosis not present

## 2023-12-08 DIAGNOSIS — E119 Type 2 diabetes mellitus without complications: Secondary | ICD-10-CM | POA: Diagnosis not present

## 2023-12-08 DIAGNOSIS — H16223 Keratoconjunctivitis sicca, not specified as Sjogren's, bilateral: Secondary | ICD-10-CM | POA: Diagnosis not present

## 2023-12-08 DIAGNOSIS — H0288A Meibomian gland dysfunction right eye, upper and lower eyelids: Secondary | ICD-10-CM | POA: Diagnosis not present

## 2023-12-08 DIAGNOSIS — H353132 Nonexudative age-related macular degeneration, bilateral, intermediate dry stage: Secondary | ICD-10-CM | POA: Diagnosis not present

## 2023-12-13 ENCOUNTER — Other Ambulatory Visit (HOSPITAL_COMMUNITY): Payer: Self-pay

## 2023-12-15 ENCOUNTER — Other Ambulatory Visit (HOSPITAL_COMMUNITY): Payer: Self-pay

## 2023-12-15 MED ORDER — LISDEXAMFETAMINE DIMESYLATE 50 MG PO CAPS
50.0000 mg | ORAL_CAPSULE | Freq: Every morning | ORAL | 0 refills | Status: AC
  Filled 2023-12-15: qty 30, 30d supply, fill #0

## 2023-12-15 MED ORDER — QUETIAPINE FUMARATE 50 MG PO TABS
50.0000 mg | ORAL_TABLET | Freq: Every day | ORAL | 1 refills | Status: AC
  Filled 2024-01-17: qty 90, 90d supply, fill #0

## 2023-12-22 DIAGNOSIS — E890 Postprocedural hypothyroidism: Secondary | ICD-10-CM | POA: Diagnosis not present

## 2023-12-22 DIAGNOSIS — E119 Type 2 diabetes mellitus without complications: Secondary | ICD-10-CM | POA: Diagnosis not present

## 2023-12-26 ENCOUNTER — Other Ambulatory Visit (HOSPITAL_COMMUNITY): Payer: Self-pay

## 2024-01-02 DIAGNOSIS — E538 Deficiency of other specified B group vitamins: Secondary | ICD-10-CM | POA: Diagnosis not present

## 2024-01-16 ENCOUNTER — Other Ambulatory Visit: Payer: Self-pay

## 2024-01-16 ENCOUNTER — Other Ambulatory Visit (HOSPITAL_COMMUNITY): Payer: Self-pay

## 2024-01-16 MED ORDER — OZEMPIC (2 MG/DOSE) 8 MG/3ML ~~LOC~~ SOPN
2.0000 mg | PEN_INJECTOR | SUBCUTANEOUS | 1 refills | Status: AC
  Filled 2024-01-16: qty 6, 56d supply, fill #0
  Filled 2024-02-21: qty 9, 84d supply, fill #0

## 2024-01-17 ENCOUNTER — Other Ambulatory Visit (HOSPITAL_COMMUNITY): Payer: Self-pay

## 2024-01-17 ENCOUNTER — Other Ambulatory Visit: Payer: Self-pay

## 2024-01-17 MED ORDER — PHENTERMINE HCL 37.5 MG PO TABS
37.5000 mg | ORAL_TABLET | Freq: Every day | ORAL | 0 refills | Status: AC
  Filled 2024-01-17: qty 90, 90d supply, fill #0

## 2024-01-30 ENCOUNTER — Other Ambulatory Visit: Payer: Self-pay

## 2024-01-30 ENCOUNTER — Other Ambulatory Visit (HOSPITAL_COMMUNITY): Payer: Self-pay

## 2024-01-30 MED ORDER — LUBIPROSTONE 8 MCG PO CAPS
8.0000 ug | ORAL_CAPSULE | Freq: Two times a day (BID) | ORAL | 5 refills | Status: AC
  Filled 2024-01-30: qty 60, 30d supply, fill #0

## 2024-02-04 ENCOUNTER — Other Ambulatory Visit (HOSPITAL_COMMUNITY): Payer: Self-pay

## 2024-02-04 ENCOUNTER — Other Ambulatory Visit (HOSPITAL_BASED_OUTPATIENT_CLINIC_OR_DEPARTMENT_OTHER): Payer: Self-pay

## 2024-02-08 ENCOUNTER — Other Ambulatory Visit: Payer: Self-pay

## 2024-02-21 ENCOUNTER — Other Ambulatory Visit: Payer: Self-pay
